# Patient Record
Sex: Female | Born: 1970
Health system: Southern US, Community
[De-identification: ages and names within clinical notes are randomized; demographics above are authoritative.]

## PROBLEM LIST (undated history)

## (undated) DIAGNOSIS — R112 Nausea with vomiting, unspecified: Secondary | ICD-10-CM

## (undated) DIAGNOSIS — E669 Obesity, unspecified: Secondary | ICD-10-CM

## (undated) DIAGNOSIS — Z9889 Other specified postprocedural states: Secondary | ICD-10-CM

## (undated) DIAGNOSIS — IMO0002 Reserved for concepts with insufficient information to code with codable children: Secondary | ICD-10-CM

## (undated) DIAGNOSIS — D649 Anemia, unspecified: Secondary | ICD-10-CM

## (undated) DIAGNOSIS — J45909 Unspecified asthma, uncomplicated: Secondary | ICD-10-CM

## (undated) DIAGNOSIS — I10 Essential (primary) hypertension: Secondary | ICD-10-CM

## (undated) HISTORY — PX: CHOLECYSTECTOMY: SHX55

## (undated) HISTORY — PX: GASTRIC BYPASS: SHX52

---

## 2000-07-20 ENCOUNTER — Other Ambulatory Visit: Admission: RE | Admit: 2000-07-20 | Discharge: 2000-07-20 | Payer: Self-pay | Admitting: Obstetrics and Gynecology

## 2000-12-28 ENCOUNTER — Encounter: Payer: Self-pay | Admitting: Obstetrics and Gynecology

## 2000-12-28 ENCOUNTER — Inpatient Hospital Stay (HOSPITAL_COMMUNITY): Admission: AD | Admit: 2000-12-28 | Discharge: 2000-12-28 | Payer: Self-pay | Admitting: Obstetrics and Gynecology

## 2000-12-29 ENCOUNTER — Inpatient Hospital Stay (HOSPITAL_COMMUNITY): Admission: AD | Admit: 2000-12-29 | Discharge: 2000-12-29 | Payer: Self-pay | Admitting: Obstetrics and Gynecology

## 2000-12-31 ENCOUNTER — Ambulatory Visit (HOSPITAL_COMMUNITY): Admission: RE | Admit: 2000-12-31 | Discharge: 2000-12-31 | Payer: Self-pay | Admitting: Obstetrics and Gynecology

## 2000-12-31 ENCOUNTER — Encounter: Payer: Self-pay | Admitting: Obstetrics and Gynecology

## 2001-01-03 ENCOUNTER — Ambulatory Visit (HOSPITAL_COMMUNITY): Admission: RE | Admit: 2001-01-03 | Discharge: 2001-01-03 | Payer: Self-pay | Admitting: Obstetrics and Gynecology

## 2001-01-03 ENCOUNTER — Encounter: Payer: Self-pay | Admitting: Obstetrics and Gynecology

## 2001-01-06 ENCOUNTER — Encounter (HOSPITAL_COMMUNITY): Admission: RE | Admit: 2001-01-06 | Discharge: 2001-01-20 | Payer: Self-pay | Admitting: Obstetrics and Gynecology

## 2001-01-06 ENCOUNTER — Encounter: Payer: Self-pay | Admitting: Obstetrics and Gynecology

## 2001-01-10 ENCOUNTER — Encounter: Payer: Self-pay | Admitting: Obstetrics and Gynecology

## 2001-01-13 ENCOUNTER — Encounter: Payer: Self-pay | Admitting: Obstetrics and Gynecology

## 2001-01-13 ENCOUNTER — Ambulatory Visit (HOSPITAL_COMMUNITY): Admission: RE | Admit: 2001-01-13 | Discharge: 2001-01-13 | Payer: Self-pay | Admitting: Obstetrics and Gynecology

## 2001-01-13 ENCOUNTER — Inpatient Hospital Stay (HOSPITAL_COMMUNITY): Admission: AD | Admit: 2001-01-13 | Discharge: 2001-01-13 | Payer: Self-pay | Admitting: Obstetrics and Gynecology

## 2001-01-17 ENCOUNTER — Encounter: Payer: Self-pay | Admitting: Obstetrics and Gynecology

## 2001-01-17 ENCOUNTER — Ambulatory Visit (HOSPITAL_COMMUNITY): Admission: RE | Admit: 2001-01-17 | Discharge: 2001-01-17 | Payer: Self-pay | Admitting: Obstetrics and Gynecology

## 2001-01-20 ENCOUNTER — Encounter: Payer: Self-pay | Admitting: Obstetrics and Gynecology

## 2001-01-23 ENCOUNTER — Inpatient Hospital Stay (HOSPITAL_COMMUNITY): Admission: AD | Admit: 2001-01-23 | Discharge: 2001-01-29 | Payer: Self-pay | Admitting: Obstetrics and Gynecology

## 2001-01-30 ENCOUNTER — Encounter: Admission: RE | Admit: 2001-01-30 | Discharge: 2001-03-01 | Payer: Self-pay | Admitting: Obstetrics and Gynecology

## 2001-03-01 ENCOUNTER — Other Ambulatory Visit: Admission: RE | Admit: 2001-03-01 | Discharge: 2001-03-01 | Payer: Self-pay | Admitting: Obstetrics and Gynecology

## 2002-05-24 ENCOUNTER — Other Ambulatory Visit: Admission: RE | Admit: 2002-05-24 | Discharge: 2002-05-24 | Payer: Self-pay | Admitting: Obstetrics and Gynecology

## 2003-08-06 ENCOUNTER — Other Ambulatory Visit: Admission: RE | Admit: 2003-08-06 | Discharge: 2003-08-06 | Payer: Self-pay | Admitting: Obstetrics and Gynecology

## 2004-07-16 ENCOUNTER — Emergency Department (HOSPITAL_COMMUNITY): Admission: EM | Admit: 2004-07-16 | Discharge: 2004-07-16 | Payer: Self-pay | Admitting: Emergency Medicine

## 2006-02-25 ENCOUNTER — Emergency Department (HOSPITAL_COMMUNITY): Admission: EM | Admit: 2006-02-25 | Discharge: 2006-02-25 | Payer: Self-pay | Admitting: Emergency Medicine

## 2009-08-14 ENCOUNTER — Emergency Department (HOSPITAL_COMMUNITY): Admission: EM | Admit: 2009-08-14 | Discharge: 2009-08-14 | Payer: Self-pay | Admitting: Emergency Medicine

## 2010-02-15 ENCOUNTER — Encounter: Payer: Self-pay | Admitting: Gastroenterology

## 2010-04-11 LAB — COMPREHENSIVE METABOLIC PANEL WITH GFR
AST: 146 U/L — ABNORMAL HIGH (ref 0–37)
Albumin: 3.5 g/dL (ref 3.5–5.2)
Alkaline Phosphatase: 78 U/L (ref 39–117)
BUN: 9 mg/dL (ref 6–23)
CO2: 27 meq/L (ref 19–32)
Chloride: 106 meq/L (ref 96–112)
Creatinine, Ser: 0.46 mg/dL (ref 0.4–1.2)
GFR calc Af Amer: >60 mL/min (ref >60)
GFR calc non Af Amer: >60 mL/min (ref >60)
Potassium: 3.8 meq/L (ref 3.5–5.1)
Total Bilirubin: 0.9 mg/dL (ref 0.3–1.2)

## 2010-04-11 LAB — DIFFERENTIAL
Basophils Absolute: 0 K/uL (ref 0.0–0.1)
Basophils Relative: 0 % (ref 0–1)
Eosinophils Absolute: 0.1 10*3/uL (ref 0.0–0.7)
Eosinophils Relative: 1 % (ref 0–5)
Lymphocytes Relative: 22 % (ref 12–46)
Lymphs Abs: 1.5 10*3/uL (ref 0.7–4.0)
Monocytes Absolute: 0.5 K/uL (ref 0.1–1.0)
Monocytes Relative: 7 % (ref 3–12)
Neutro Abs: 4.5 10*3/uL (ref 1.7–7.7)
Neutrophils Relative %: 69 % (ref 43–77)

## 2010-04-11 LAB — CBC
HCT: 31.1 % — ABNORMAL LOW (ref 36.0–46.0)
Hemoglobin: 9.9 g/dL — ABNORMAL LOW (ref 12.0–15.0)
MCH: 23.8 pg — ABNORMAL LOW (ref 26.0–34.0)
MCHC: 31.7 g/dL (ref 30.0–36.0)
MCV: 75.1 fL — ABNORMAL LOW (ref 78.0–100.0)
Platelets: 284 10*3/uL (ref 150–400)
RBC: 4.14 MIL/uL (ref 3.87–5.11)
RDW: 17.3 % — ABNORMAL HIGH (ref 11.5–15.5)
WBC: 6.5 K/uL (ref 4.0–10.5)

## 2010-04-11 LAB — COMPREHENSIVE METABOLIC PANEL
ALT: 46 U/L — ABNORMAL HIGH (ref 0–35)
Calcium: 8.7 mg/dL (ref 8.4–10.5)
Glucose, Bld: 93 mg/dL (ref 70–99)
Sodium: 139 mEq/L (ref 135–145)
Total Protein: 6.3 g/dL (ref 6.0–8.3)

## 2010-04-11 LAB — HEMOCCULT GUIAC POC 1CARD (OFFICE): Fecal Occult Bld: NEGATIVE

## 2010-04-11 LAB — LIPASE, BLOOD: Lipase: 31 U/L (ref 11–59)

## 2010-06-12 NOTE — Op Note (Signed)
[facility]  Patient:    [name], [name] Visit Number: [id number] MRN: [id number]          Service Type: ANT Location: [facility] Attending Physician:  Frederich Balding Dictated by:   Marcelle Overlie, M.D. Proc. Date: 01/26/01 Admit Date:  01/06/2001 Discharge Date: 01/20/2001                             Operative Report  PREOPERATIVE DIAGNOSES:       1. Intrauterine pregnancy at 36-4/7 weeks.                               2. Chronic hypertension.                               3. Intrauterine growth _______.                               4. Failure to progress.  POSTOPERATIVE DIAGNOSES:      1. Intrauterine pregnancy at 36-4/7 weeks.                               2. Chronic hypertension.                               3. Intrauterine growth _______.                               4. Failure to progress.  OPERATION:                    Primary low transverse cesarean section.  SURGEON:                      Marcelle Overlie, M.D.  ASSISTANT:                    Juluis Mire, M.D.  ANESTHESIA:                   Epidural.  ESTIMATED BLOOD LOSS:         500 cc.  DESCRIPTION OF PROCEDURE:     The patient was taken to the operating room. She had her epidural dosed.  It was found to be adequate.  She was then prepped and draped in the usual sterile fashion.  A Foley catheter was already placed in the bladder.  Using a scalpel, a low transverse incision was made, and carried down to the fascia.  Using electrocautery with good hemostasis, the fascia was scored in the midline and extended laterally.  A Pfannenstiel incision was then created, and the peritoneum was then entered.  The peritoneal incision was then stretched.  The bladder blade was inserted.  The lower uterine segment was identified, and the bladder flap was then created sharply initially, and the bladder blade was then readjusted.  A low transverse incision was made in the uterus and  extended laterally.  Amniotic fluid was noted to be clear.  The baby was in the cephalic presentation and delivered quite easily.  It was a female infant with Apgars  8 at one minute and 9 at five minutes.  Cord pH was obtained and found to be 7.22.  Cord was clamped and cut.  The baby was handed to the awaiting pediatricians.  Cord blood was obtained and the placenta was manually removed and noted to be intact.  The uterus was cleared of all clots and debris.  The uterine incision was then closed using 0 chromic and continuous running locked stitch and found to be hemostatic.  There were two figure-of-eights placed on the left side at the left corner of the incision for additional hemostasis.  The peritoneum was closed in a single layer using a Vicryl continuous running stitch, and the rectus muscles were reapproximated using the same 0 Vicryl.  The fascia was closed using #1 PDS suture using a continuous running stitch, starting at each corner and meeting in the midline.  After inspection of the subcutaneous layers, a Hemovac drain was placed in the subcutaneous layer, and then a series of interrupted using plain gut suture x 4 was placed in the subcutaneous layer.  The skin was closed with staples. All sponge, lap, and instrument counts were correct x 2.  The patient tolerated the procedure well and went to the recovery room in stable condition. Dictated by:   Marcelle Overlie, M.D. Attending Physician:  Frederich Balding DD:  01/26/01 TD:  01/27/01 Job: [id number]

## 2010-06-12 NOTE — Discharge Summary (Signed)
[facility]  Patient:    [name], [name] Visit Number: [id number] MRN: [id number]          Service Type: OBS Location: 910A 9104 01 Attending Physician:  Cordelia Pen Ii Dictated by:   Danie Chandler, R.N. Admit Date:  01/23/2001 Discharge Date: 01/29/2001                             Discharge Summary  ADMISSION DIAGNOSIS:  Intrauterine pregnancy at 36-4/[redacted] weeks gestation with intrauterine growth retardation and chronic hypertension with elevated blood pressures.  DISCHARGE DIAGNOSES: 1. Intrauterine pregnancy at 36-4/[redacted] weeks gestation with intrauterine growth    retardation and chronic hypertension with elevated blood pressures. 2. Failure to progress.  PROCEDURE:  On January 26, 2001, primary low transverse cesarean section.  REASON FOR ADMISSION:  The patient is a 40 year old married white female, gravida 1, para 0, with an estimated date of confinement of February 16, 2001, placing her at approximately 36-1/[redacted] weeks gestation.  Her pregnancy has been complicated by morbid obesity, chronic hypertension, for which she has been on Aldomet, IUGR.  The patient was monitored with beefs two times a week.  The patient also had non-stress tests which were reactive, but the patient has had an increase in blood pressures, 150s/90s to 100s, thus she was admitted for induction of labor.  The patient had Cytotec placed.  She had Pitocin augmentation and artificial rupture of membranes carried out, and she progressed to a good labor pattern all day long on January 26, 2001.  Despite adequate MVU, she did not change her cervix, and the cervix remained at 2 cm dilated.  Thus, she was taken to the operating room for a cesarean section.  HOSPITAL COURSE:  The patient underwent the above named procedure without complication.  This was productive of a viable female infant with Apgars of 8 at one minute and 9 at five minutes.  Postoperatively, on day  #1, the patient was without complaints.  She had a good return of bowel function.  Her blood pressure was stable on Aldomet.  Her hemoglobin was 11.4, hematocrit 34.8, and white blood cell count 10.8.  On postoperative day #2, the patient remained without complaints.  Blood pressure was 146/77, and she remained on Aldomet. She was tolerating a regular diet and ambulating well without difficulty, and also had good pain control.  She was discharged home on postoperative day #3.  CONDITION ON DISCHARGE:  Good.  DIET:  Regular as tolerated.  ACTIVITY:  No heavy lifting, no driving, no vaginal entry.  FOLLOWUP:  She was to follow up in the office on February 02, 2001.  SHe is to call for temperature greater than 100 degrees, persistent nausea, vomiting, heavy vaginal bleeding, and/or redness or drainage from the incision site.  DISCHARGE MEDICATIONS: 1. Prenatal vitamin one p.o. q.d. 2. Aldomet 500 mg one p.o. b.Townsend.d. 3. Tylox one or two p.o. q.4h. p.r.n. pain. 4. Ibuprofen p.r.n. Dictated by:   Danie Chandler, R.N. Attending Physician:  Soledad Gerlach DD:  02/03/01 TD:  02/04/01 Job: [id number]

## 2011-03-10 ENCOUNTER — Observation Stay (HOSPITAL_COMMUNITY)
Admission: EM | Admit: 2011-03-10 | Discharge: 2011-03-10 | Disposition: A | Discharge status: Home / Self Care | Payer: Self-pay | Attending: Emergency Medicine | Admitting: Emergency Medicine

## 2011-03-10 ENCOUNTER — Observation Stay (HOSPITAL_COMMUNITY): Payer: Self-pay

## 2011-03-10 ENCOUNTER — Encounter (HOSPITAL_COMMUNITY): Payer: Self-pay | Admitting: *Deleted

## 2011-03-10 DIAGNOSIS — I10 Essential (primary) hypertension: Secondary | ICD-10-CM | POA: Insufficient documentation

## 2011-03-10 DIAGNOSIS — M51379 Other intervertebral disc degeneration, lumbosacral region without mention of lumbar back pain or lower extremity pain: Principal | ICD-10-CM | POA: Insufficient documentation

## 2011-03-10 DIAGNOSIS — M545 Low back pain, unspecified: Secondary | ICD-10-CM

## 2011-03-10 DIAGNOSIS — E669 Obesity, unspecified: Secondary | ICD-10-CM | POA: Insufficient documentation

## 2011-03-10 DIAGNOSIS — M5137 Other intervertebral disc degeneration, lumbosacral region: Secondary | ICD-10-CM | POA: Insufficient documentation

## 2011-03-10 DIAGNOSIS — IMO0002 Reserved for concepts with insufficient information to code with codable children: Secondary | ICD-10-CM

## 2011-03-10 DIAGNOSIS — M5126 Other intervertebral disc displacement, lumbar region: Secondary | ICD-10-CM | POA: Insufficient documentation

## 2011-03-10 HISTORY — DX: Essential (primary) hypertension: I10

## 2011-03-10 MED ORDER — ONDANSETRON HCL 4 MG/2ML IJ SOLN
4.0000 mg | Freq: Four times a day (QID) | INTRAMUSCULAR | Status: DC | PRN
  Administered 2011-03-10: 4 mg via INTRAVENOUS
  Filled 2011-03-10: qty 2

## 2011-03-10 MED ORDER — HYDROMORPHONE HCL PF 1 MG/ML IJ SOLN
1.0000 mg | Freq: Four times a day (QID) | INTRAMUSCULAR | Status: DC | PRN
  Filled 2011-03-10: qty 1

## 2011-03-10 MED ORDER — OXYCODONE-ACETAMINOPHEN 5-325 MG PO TABS
1.0000 | ORAL_TABLET | Freq: Four times a day (QID) | ORAL | Status: DC | PRN
Start: 2011-03-10 — End: 2011-03-10
  Administered 2011-03-10: 2 via ORAL
  Filled 2011-03-10: qty 2

## 2011-03-10 MED ORDER — OXYCODONE-ACETAMINOPHEN 5-325 MG PO TABS: 1.0000 | ORAL_TABLET | ORAL | Status: AC | PRN

## 2011-03-10 MED ORDER — DIAZEPAM 5 MG PO TABS: 5.0000 mg | ORAL_TABLET | Freq: Three times a day (TID) | ORAL | Status: AC | PRN

## 2011-03-10 MED ORDER — KETOROLAC TROMETHAMINE 30 MG/ML IJ SOLN
30.0000 mg | Freq: Once | INTRAMUSCULAR | Status: AC
  Administered 2011-03-10: 30 mg via INTRAVENOUS
  Filled 2011-03-10: qty 1

## 2011-03-10 MED ORDER — HYDROMORPHONE HCL PF 1 MG/ML IJ SOLN
1.0000 mg | Freq: Once | INTRAMUSCULAR | Status: AC
  Administered 2011-03-10: 1 mg via INTRAVENOUS
  Filled 2011-03-10: qty 1

## 2011-03-10 MED ORDER — HYDROMORPHONE HCL PF 1 MG/ML IJ SOLN
1.0000 mg | Freq: Once | INTRAMUSCULAR | Status: AC
  Administered 2011-03-10: 1 mg via INTRAVENOUS

## 2011-03-10 MED ORDER — CYCLOBENZAPRINE HCL 10 MG PO TABS
5.0000 mg | ORAL_TABLET | Freq: Three times a day (TID) | ORAL | Status: DC | PRN
  Administered 2011-03-10: 5 mg via ORAL
  Filled 2011-03-10: qty 1

## 2011-03-10 NOTE — ED Notes (Signed)
Pt ambulated to bathroom without difficulty. Pt sts that she did feel some pain but not as much as earlier.

## 2011-03-10 NOTE — ED Provider Notes (Signed)
Medical screening examination/treatment/procedure(s) were conducted as a shared visit with non-physician practitioner(s) and myself.  I personally evaluated the patient during the encounter   Amy Sprout, MD 03/10/11 1551

## 2011-03-10 NOTE — Discharge Instructions (Signed)
Please call the orthopedist or neurosurgeon listed above for further treatment of your back pain.  You may also call an orthopedist or neurosurgeon of your choice.  Return to the ER immediately if you develop weakness of your leg, loss of control of bowel or bladder, or you are unable to bear weight on your leg.  You may return to the ER at any time for worsening condition or any new symptoms that concern you.   Back Pain, Adult Low back pain is very common. About 1 in 5 people have back pain.The cause of low back pain is rarely dangerous. The pain often gets better over time.About half of people with a sudden onset of back pain feel better in just 2 weeks. About 8 in 10 people feel better by 6 weeks.  CAUSES Some common causes of back pain include:  Strain of the muscles or ligaments supporting the spine.   Wear and tear (degeneration) of the spinal discs.   Arthritis.   Direct injury to the back.  DIAGNOSIS Most of the time, the direct cause of low back pain is not known.However, back pain can be treated effectively even when the exact cause of the pain is unknown.Answering your caregiver's questions about your overall health and symptoms is one of the most accurate ways to make sure the cause of your pain is not dangerous. If your caregiver needs more information, he or she may order lab work or imaging tests (X-rays or MRIs).However, even if imaging tests show changes in your back, this usually does not require surgery. HOME CARE INSTRUCTIONS For many people, back pain returns.Since low back pain is rarely dangerous, it is often a condition that people can learn to [facility] their own.   Remain active. It is stressful on the back to sit or stand in one place. Do not sit, drive, or stand in one place for more than 30 minutes at a time. Take short walks on level surfaces as soon as pain allows.Try to increase the length of time you walk each day.   Do not stay in bed.Resting more than  1 or 2 days can delay your recovery.   Do not avoid exercise or work.Your body is made to move.It is not dangerous to be active, even though your back may hurt.Your back will likely heal faster if you return to being active before your pain is gone.   Pay attention to your body when you bend and lift. Many people have less discomfortwhen lifting if they bend their knees, keep the load close to their bodies,and avoid twisting. Often, the most comfortable positions are those that put less stress on your recovering back.   Find a comfortable position to sleep. Use a firm mattress and lie on your side with your knees slightly bent. If you lie on your back, put a pillow under your knees.   Only take over-the-counter or prescription medicines as directed by your caregiver. Over-the-counter medicines to reduce pain and inflammation are often the most helpful.Your caregiver may prescribe muscle relaxant drugs.These medicines help dull your pain so you can more quickly return to your normal activities and healthy exercise.   Put ice on the injured area.   Put ice in a plastic bag.   Place a towel between your skin and the bag.   Leave the ice on for 15 to 20 minutes, 3 to 4 times a day for the first 2 to 3 days. After that, ice and heat may be alternated  to reduce pain and spasms.   Ask your caregiver about trying back exercises and gentle massage. This may be of some benefit.   Avoid feeling anxious or stressed.Stress increases muscle tension and can worsen back pain.It is important to recognize when you are anxious or stressed and learn ways to manage it.Exercise is a great option.  SEEK MEDICAL CARE IF:  You have pain that is not relieved with rest or medicine.   You have pain that does not improve in 1 week.   You have new symptoms.   You are generally not feeling well.  SEEK IMMEDIATE MEDICAL CARE IF:   You have pain that radiates from your back into your legs.   You develop  new bowel or bladder control problems.   You have unusual weakness or numbness in your arms or legs.   You develop nausea or vomiting.   You develop abdominal pain.   You feel faint.  Document Released: 01/11/2005 Document Revised: 09/23/2010 Document Reviewed: 06/01/2010 [facility] Patient Information 2012 [place], [place].

## 2011-03-10 NOTE — ED Provider Notes (Signed)
History     CSN: [id number]  Arrival date & time 03/10/11  7829   First MD Initiated Contact with Patient 03/10/11 [phone]      Chief Complaint  Patient presents with  . Back Pain    c/o right low back pain radiating into RLE    (Consider location/radiation/quality/duration/timing/severity/associated sxs/prior treatment) Patient is a 41 y.o. female presenting with back pain. The history is provided by the patient.  Back Pain  This is a recurrent problem. Episode onset: One month ago. The problem occurs daily. The problem has been rapidly worsening. The pain is associated with no known injury. The pain is present in the lumbar spine. The quality of the pain is described as shooting and stabbing. The pain does not radiate. The pain is at a severity of 10/10. The pain is severe. The symptoms are aggravated by bending, twisting and certain positions (Unable to put pressure on the right leg due to severe pain in the right side of her back). The pain is the same all the time. Pertinent negatives include no chest pain, no fever, no numbness, no weight loss, no abdominal pain, no abdominal swelling, no bowel incontinence, no bladder incontinence, no dysuria, no paresthesias, no paresis and no weakness. She has tried NSAIDs, muscle relaxants and analgesics (Received steroid shots and tried tramadol and Vicodin) for the symptoms. The treatment provided no relief. Risk factors include obesity.    Past Medical History  Diagnosis Date  . Hypertension     Past Surgical History  Procedure Date  . Cholecystectomy   . Gastric bypass     No family history on file.  History  Substance Use Topics  . Smoking status: Never Smoker   . Smokeless tobacco: Not on file  . Alcohol Use: No    OB History    Grav Para Term Preterm Abortions TAB SAB Ect Mult Living                  Review of Systems  Constitutional: Negative for fever and weight loss.  Cardiovascular: Negative for chest pain.    Gastrointestinal: Negative for abdominal pain and bowel incontinence.  Genitourinary: Negative for bladder incontinence and dysuria.  Musculoskeletal: Positive for back pain.  Neurological: Negative for weakness, numbness and paresthesias.  All other systems reviewed and are negative.    Allergies  Review of patient's allergies indicates no known allergies.  Home Medications  No current outpatient prescriptions on file.  BP 142/65  Pulse 90  Temp(Src) 98.3 F (36.8 C) (Oral)  Resp 20  SpO2 100%  LMP 03/09/2011  Physical Exam  Nursing note and vitals reviewed. Constitutional: She is oriented to person, place, and time. She appears well-developed and well-nourished. She appears distressed.  HENT:  Head: Normocephalic and atraumatic.  Mouth/Throat: Oropharynx is clear and moist.  Eyes: EOM are normal. Pupils are equal, round, and reactive to light.  Cardiovascular: Normal rate, regular rhythm, normal heart sounds and intact distal pulses.  Exam reveals no friction rub.   No murmur heard. Pulmonary/Chest: Effort normal and breath sounds normal. She has no wheezes. She has no rales.  Abdominal: Soft. Bowel sounds are normal. She exhibits no distension. There is no tenderness. There is no rebound and no guarding.  Musculoskeletal: She exhibits no tenderness.       Lumbar back: She exhibits decreased range of motion, tenderness, bony tenderness, pain and spasm. She exhibits no deformity and normal pulse.       Large amount of  excess skin. No edema  Neurological: She is alert and oriented to person, place, and time. No cranial nerve deficit.  Skin: Skin is warm and dry. No rash noted.  Psychiatric: She has a normal mood and affect. Her behavior is normal.    ED Course  Procedures (including critical care time)  Labs Reviewed - No data to display No results found.   No diagnosis found.    MDM   Pt with gradual onset of back pain intermittently for the last month became  severe on Saturday and saw her doctor yesterday and received IM Injections of pain medication and steroids.  At this morning she was showering the pain started to return and became severe to the point where she was unable to ambulate due to severe pain with pressure on the right leg. She has a history of gastric bypass and has lost a large amount of weight but denies any prior back problems, cancer, trauma. 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 No neurovascular compromise and no incontinence.  Pt has no infectious sx, hx of CA  or other red flags concerning for pathologic back pain.  Normal strength and reflexes on exam.  Denies trauma.  She was so uncomfortable she is unable to ambulate. She has an odor to get an MRI done at [facility] imaging however we will just do the MRI here and place her on the back pain protocol for pain control.         Gwyneth Sprout, MD 03/10/11 [phone]

## 2011-03-10 NOTE — Progress Notes (Signed)
Observation review is complete. 

## 2011-03-10 NOTE — ED Provider Notes (Signed)
1:18 PM patient is in CDU holding for MRI of her L-spine and pain control.  Patient reports pain has improved somewhat with pain medications, but she continues to have pain in her right lower back when she moves her right leg.  MRI result shows degenerative disc disease at L1 to and an annular rent and a very shallow right forming a disc protrusion at L4-5, but no neural compression.  Discussed results with patient and family.    3:17 PM Patient ambulates without assistance.  Discussed results with Dr Anitra Lauth.  Plan is for d/c home with increased pain medication.  Discussed this with patient.  Patient verbalizes understanding and agrees with plan. [name], [name] 03/10/11 [phone]

## 2011-03-10 NOTE — ED Notes (Signed)
Patient arrived via EMS, ambulatory at home with intermittent right low back pain x"weeks" worsened 5 days ago. No known injury.  Denies dysuria

## 2011-03-10 NOTE — ED Notes (Signed)
Trixie Dredge, PA made aware that pt ambulated to the bathroom without assistance.

## 2012-07-05 ENCOUNTER — Emergency Department (HOSPITAL_COMMUNITY): Payer: BC Managed Care – PPO

## 2012-07-05 ENCOUNTER — Emergency Department (HOSPITAL_COMMUNITY)
Admission: EM | Admit: 2012-07-05 | Discharge: 2012-07-05 | Disposition: A | Discharge status: Home / Self Care | Payer: BC Managed Care – PPO | Attending: Emergency Medicine | Admitting: Emergency Medicine

## 2012-07-05 ENCOUNTER — Encounter (HOSPITAL_COMMUNITY): Payer: Self-pay | Admitting: *Deleted

## 2012-07-05 DIAGNOSIS — Z79899 Other long term (current) drug therapy: Secondary | ICD-10-CM | POA: Insufficient documentation

## 2012-07-05 DIAGNOSIS — I10 Essential (primary) hypertension: Secondary | ICD-10-CM | POA: Insufficient documentation

## 2012-07-05 DIAGNOSIS — Z3202 Encounter for pregnancy test, result negative: Secondary | ICD-10-CM | POA: Insufficient documentation

## 2012-07-05 DIAGNOSIS — K2970 Gastritis, unspecified, without bleeding: Secondary | ICD-10-CM

## 2012-07-05 DIAGNOSIS — N898 Other specified noninflammatory disorders of vagina: Secondary | ICD-10-CM | POA: Insufficient documentation

## 2012-07-05 LAB — COMPREHENSIVE METABOLIC PANEL
ALT: 10 U/L (ref 0–35)
AST: 17 U/L (ref 0–37)
CO2: 24 mEq/L (ref 19–32)
Chloride: 107 mEq/L (ref 96–112)
GFR calc Af Amer: >90 mL/min (ref >90)
GFR calc non Af Amer: >90 mL/min (ref >90)
Glucose, Bld: 88 mg/dL (ref 70–99)
Sodium: 139 mEq/L (ref 135–145)
Total Bilirubin: 0.3 mg/dL (ref 0.3–1.2)

## 2012-07-05 LAB — CBC WITH DIFFERENTIAL/PLATELET
Basophils Relative: 0 % (ref 0–1)
HCT: 25 % — ABNORMAL LOW (ref 36.0–46.0)
Hemoglobin: 6.6 g/dL — CL (ref 12.0–15.0)
Lymphocytes Relative: 31 % (ref 12–46)
Monocytes Relative: 7 % (ref 3–12)
Neutro Abs: 3.6 10*3/uL (ref 1.7–7.7)
WBC: 6.1 10*3/uL (ref 4.0–10.5)

## 2012-07-05 LAB — URINALYSIS, ROUTINE W REFLEX MICROSCOPIC
Bilirubin Urine: NEGATIVE
Glucose, UA: NEGATIVE mg/dL
Hgb urine dipstick: NEGATIVE
Ketones, ur: NEGATIVE mg/dL
Protein, ur: NEGATIVE mg/dL
Urobilinogen, UA: 1 mg/dL (ref 0.0–1.0)

## 2012-07-05 LAB — POCT PREGNANCY, URINE: Preg Test, Ur: NEGATIVE

## 2012-07-05 MED ORDER — GI COCKTAIL ~~LOC~~: 30.0000 mL | Freq: Once | ORAL | Status: DC

## 2012-07-05 MED ORDER — SUCRALFATE 1 G PO TABS: 1.0000 g | ORAL_TABLET | Freq: Four times a day (QID) | ORAL | Status: DC

## 2012-07-05 MED ORDER — SODIUM CHLORIDE 0.9 % IV BOLUS (SEPSIS)
1000.0000 mL | Freq: Once | INTRAVENOUS | Status: AC
  Administered 2012-07-05: 1000 mL via INTRAVENOUS

## 2012-07-05 MED ORDER — FAMOTIDINE 20 MG PO TABS
40.0000 mg | ORAL_TABLET | Freq: Once | ORAL | Status: AC
  Administered 2012-07-05: 40 mg via ORAL
  Filled 2012-07-05: qty 2

## 2012-07-05 MED ORDER — IOHEXOL 300 MG/ML  SOLN
100.0000 mL | Freq: Once | INTRAMUSCULAR | Status: AC | PRN
  Administered 2012-07-05: 100 mL via INTRAVENOUS

## 2012-07-05 MED ORDER — FAMOTIDINE 20 MG PO TABS: 20.0000 mg | ORAL_TABLET | Freq: Two times a day (BID) | ORAL | Status: DC

## 2012-07-05 MED ORDER — IOHEXOL 300 MG/ML  SOLN
25.0000 mL | INTRAMUSCULAR | Status: AC
  Administered 2012-07-05: 25 mL via ORAL

## 2012-07-05 NOTE — ED Provider Notes (Signed)
History     CSN: [id number]  Arrival date & time 07/05/12  1245   First MD Initiated Contact with Patient 07/05/12 1507      Chief Complaint  Patient presents with  . Abdominal Pain    (Consider location/radiation/quality/duration/timing/severity/associated sxs/prior treatment) HPI Comments: 42 year old morbidly obese female the past medical history of hypertension, iron deficiency anemia, cholecystectomy in 2011 and Roux-en-Y gastric bypass surgery in 2004 presents to the emergency department complaining of non-radiating mid epigastric abdominal pain beginning today around 8:30 this morning. Patient states she was sitting at her desk at work when the pain began. Pain described as cramping with occasional sharp spurts, cramping rated 2/10, sharpness 8/10. Pain worse with deep inspiration, denies chest pain or shortness of breath. She try taking the medication at home that she has for menstrual cramping without relief. She is currently towards the end of her menstrual cycle, however her gynecologist has placed on birth control in order to prevent her from getting her cycle due to history of IDA and heavy bleeding. Cycle began on Thursday so she called him and he advised her to double up on her birth control pills and her cycle is finally beginning to subside. Denies lightheadedness, dizziness or weakness. No associated nausea, vomiting, bowel changes urinary changes. Denies having any complications with her prior bypass surgery. No fever or chills. Normal appetite. Earlier today she had scrambled eggs, and last night she had shrimp for dinner.  Patient is a 42 y.o. female presenting with abdominal pain. The history is provided by the patient.  Abdominal Pain Associated symptoms include abdominal pain. Pertinent negatives include no chest pain, chills, fatigue, fever, nausea, vomiting or weakness.    Past Medical History  Diagnosis Date  . Hypertension     Past Surgical History  Procedure  Laterality Date  . Cholecystectomy    . Gastric bypass      No family history on file.  History  Substance Use Topics  . Smoking status: Never Smoker   . Smokeless tobacco: Not on file  . Alcohol Use: No    OB History   Grav Para Term Preterm Abortions TAB SAB Ect Mult Living                  Review of Systems  Constitutional: Negative for fever, chills, appetite change and fatigue.  Respiratory: Negative for shortness of breath.   Cardiovascular: Negative for chest pain.  Gastrointestinal: Positive for abdominal pain. Negative for nausea, vomiting, diarrhea and constipation.  Genitourinary: Positive for vaginal bleeding. Negative for dysuria, urgency, frequency, vaginal discharge, difficulty urinating and vaginal pain.  Musculoskeletal: Negative for back pain.  Skin: Negative for pallor.  Neurological: Negative for dizziness, weakness and light-headedness.  All other systems reviewed and are negative.    Allergies  Review of patient's allergies indicates no known allergies.  Home Medications   Current Outpatient Rx  Name  Route  Sig  Dispense  Refill  . albuterol (PROVENTIL HFA;VENTOLIN HFA) 108 (90 BASE) MCG/ACT inhaler   Inhalation   Inhale 1-2 puffs into the lungs every 6 (six) hours as needed. For wheezing         . atenolol (TENORMIN) 25 MG tablet   Oral   Take 12.5 mg by mouth daily.         . ferrous sulfate 325 (65 FE) MG tablet   Oral   Take 325 mg by mouth daily with breakfast.         . Norethin  Ace-Eth Estrad-FE (MINASTRIN 24 FE PO)   Oral   Take 1 tablet by mouth daily.           BP 150/70  Pulse 73  Temp(Src) 98.2 F (36.8 C) (Oral)  Resp 20  SpO2 100%  Physical Exam  Nursing note and vitals reviewed. Constitutional: She is oriented to person, place, and time. She appears well-developed.  Morbidly obese  HENT:  Head: Normocephalic and atraumatic.  Mouth/Throat: Oropharynx is clear and moist. Mucous membranes are not pale  and not dry.  Eyes: Conjunctivae and EOM are normal. Pupils are equal, round, and reactive to light.  Neck: Normal range of motion. Neck supple.  Cardiovascular: Normal rate, regular rhythm, normal heart sounds and intact distal pulses.   Pulmonary/Chest: Effort normal and breath sounds normal. No respiratory distress.  Abdominal: Soft. Bowel sounds are normal. There is tenderness in the right upper quadrant and epigastric area. There is no rigidity, no rebound and no guarding.  Abdominal exam limited by patient's body habitus.  Musculoskeletal: Normal range of motion. She exhibits no edema.  Neurological: She is alert and oriented to person, place, and time.  Skin: Skin is warm and dry. No pallor.  Psychiatric: She has a normal mood and affect. Her behavior is normal.    ED Course  Procedures (including critical care time)  Labs Reviewed  CBC WITH DIFFERENTIAL - Abnormal; Notable for the following:    RBC 3.85 (*)    Hemoglobin 6.6 (*)    HCT 25.0 (*)    MCV 64.9 (*)    MCH 17.1 (*)    MCHC 26.4 (*)    RDW 22.5 (*)    Platelets 440 (*)    All other components within normal limits  COMPREHENSIVE METABOLIC PANEL - Abnormal; Notable for the following:    Potassium 3.4 (*)    Albumin 3.1 (*)    All other components within normal limits  LIPASE, BLOOD  URINALYSIS, ROUTINE W REFLEX MICROSCOPIC  POCT PREGNANCY, URINE  POCT I-STAT TROPONIN I   Ct Abdomen Pelvis W Contrast  07/05/2012   *RADIOLOGY REPORT*  Clinical Data: Mid abdominal pain and history of gastric bypass surgery and laparoscopic cholecystectomy.  CT ABDOMEN AND PELVIS WITH CONTRAST  Technique:  Multidetector CT imaging of the abdomen and pelvis was performed following the standard protocol during bolus administration of intravenous contrast.  Contrast: OMNIPAQUE IOHEXOL 300 MG/ML  SOLN  Comparison: 08/14/2009  Findings: There is evidence of Roux-en-Y gastric bypass with no evidence of complication by CT or  obstruction.  Oral contrast does exit the gastric remnant and enters normal appearing small bowel loops.  There is no evidence of bowel perforation or abscess.  The liver, spleen, pancreas, adrenal glands and kidneys are unremarkable.  The gallbladder has been removed.  No biliary ductal dilatation is identified.  No free fluid or abscess is identified.  No focal masses or enlarged lymph nodes are seen.  There is a stable ventral hernia just superior to the umbilicus containing fat.  The bladder, uterus and adnexal regions are unremarkable by CT.  Mild degenerative changes are present in the spine.  IMPRESSION: No acute findings.  No complications are evident status post prior gastric bypass.  There is a stable supraumbilical ventral hernia containing fat.   Original Report Authenticated By: Irish Lack, M.D.     1. Gastritis       MDM  42 y/o female with mid-epigastric abdominal pain. No other symptoms present. HGB 6.6.  Asymptomatic anemia. Will give fluids at this time. Hx of Roux-en-Y gastric bypass surgery. Abdominal exam limited by large body habitus. Will obtain CT abdomen/pelvis to rule out any intra-abdominal abnormality. Labs otherwise unremarkable. Vitals stable, afebrile and in NAD. GI cocktail and Pepcid. Case discussed with Dr. Jeraldine Loots who also evaluated patient and agrees with plan of care. 6:04 PM CT scan negative for any intra-abdominal abnormality. Patient reports marked improvement in pain with Pepcid. She did not receive GI cocktail prior to CT, however states she does not need it. Will discharge with pepcid and carafate for gastritis, f/u with PCP. Return precautions discussed. Patient states understanding of plan and is agreeable.  Trevor Mace, PA-C 07/05/12 1805

## 2012-07-05 NOTE — ED Notes (Signed)
Pt states that at 0830 she started having severe mid upper abdominal pain and states no chest pain.  Pt states with a deep breath the pain in her abdomen increases

## 2012-07-06 NOTE — ED Provider Notes (Signed)
  Medical screening examination/treatment/procedure(s) were performed by non-physician practitioner and as supervising physician I was immediately available for consultation/collaboration.  On my exam the patient was in no distress. With her history of gastric bypass or some concern for intra-abdominal pathology.  Patient's CT was unremarkable him and she improved with analgesics here.    Gerhard Munch, MD 07/06/12 0004

## 2012-09-02 ENCOUNTER — Ambulatory Visit (INDEPENDENT_AMBULATORY_CARE_PROVIDER_SITE_OTHER): Payer: BC Managed Care – PPO | Admitting: Emergency Medicine

## 2012-09-02 ENCOUNTER — Ambulatory Visit (HOSPITAL_COMMUNITY)
Admission: RE | Admit: 2012-09-02 | Discharge: 2012-09-02 | Disposition: A | Discharge status: Home / Self Care | Payer: BC Managed Care – PPO | Source: Ambulatory Visit | Attending: Emergency Medicine | Admitting: Emergency Medicine

## 2012-09-02 VITALS — BP 140/82 | HR 73 | Temp 98.0°F | Resp 16 | Ht 64.0 in | Wt 328.0 lb

## 2012-09-02 DIAGNOSIS — I809 Phlebitis and thrombophlebitis of unspecified site: Secondary | ICD-10-CM

## 2012-09-02 DIAGNOSIS — I8000 Phlebitis and thrombophlebitis of superficial vessels of unspecified lower extremity: Secondary | ICD-10-CM | POA: Insufficient documentation

## 2012-09-02 DIAGNOSIS — M79609 Pain in unspecified limb: Secondary | ICD-10-CM | POA: Insufficient documentation

## 2012-09-02 NOTE — Progress Notes (Signed)
[facility] [address], [place] [address] [phone]  Date:  09/02/2012   Name:  [name]   DOB:  1971/01/23   MRN:  [id number]  PCP:  No PCP Per Patient    Chief Complaint: Leg Pain   History of Present Illness:  [name] is a 42 y.o. very pleasant female patient who presents with the following:  Pain in right leg just distal to the knee laterally.  Now has tender swelling that is warm to the touch.  No fever or chills. No history or injury.  On a new OCP to stop her menses by the GYN this summer.  Non smoker.  Hypertension and anemia.  No improvement with over the counter medications or other home remedies.  Denies other complaint or health concern today.   There are no active problems to display for this patient.   Past Medical History  Diagnosis Date  . Hypertension     Past Surgical History  Procedure Laterality Date  . Cholecystectomy    . Gastric bypass      History  Substance Use Topics  . Smoking status: Never Smoker   . Smokeless tobacco: Not on file  . Alcohol Use: No    Family History  Problem Relation Age of Onset  . Hypertension Sister     No Known Allergies  Medication list has been reviewed and updated.  Current Outpatient Prescriptions on File Prior to Visit  Medication Sig Dispense Refill  . albuterol (PROVENTIL HFA;VENTOLIN HFA) 108 (90 BASE) MCG/ACT inhaler Inhale 1-2 puffs into the lungs every 6 (six) hours as needed. For wheezing      . atenolol (TENORMIN) 25 MG tablet Take 12.5 mg by mouth daily.      . famotidine (PEPCID) 20 MG tablet Take 1 tablet (20 mg total) by mouth 2 (two) times daily.  30 tablet  0  . ferrous sulfate 325 (65 FE) MG tablet Take 325 mg by mouth daily with breakfast.      . sucralfate (CARAFATE) 1 G tablet Take 1 tablet (1 g total) by mouth 4 (four) times daily.  40 tablet  0  . Norethin Ace-Eth Estrad-FE (MINASTRIN 24 FE PO) Take 1 tablet by mouth daily.       No  current facility-administered medications on file prior to visit.    Review of Systems:  As per HPI, otherwise negative.    Physical Examination: Filed Vitals:   09/02/12 0901  BP: 140/82  Pulse: 73  Temp: 98 F (36.7 C)  Resp: 16   Filed Vitals:   09/02/12 0901  Height: 5\' 4"  (1.626 m)  Weight: 328 lb (148.78 kg)   Body mass index is 56.27 kg/(m^2). Ideal Body Weight: Weight in (lb) to have BMI = 25: 145.3   GEN: morbid obesity, NAD, Non-toxic, Alert & Oriented x 3 HEENT: Atraumatic, Normocephalic.  Ears and Nose: No external deformity. EXTR: No clubbing/cyanosis/edema NEURO: Normal gait.  PSYCH: Normally interactive. Conversant. Not depressed or anxious appearing.  Calm demeanor.  CHEST:  CTA BS= EXT:   Extremely difficult to assess due obesity.  Has an area of superficial phlebitis in right lower leg laterally   Assessment and Plan: Phlebitis Venous doppler   Signed,  Phillips Odor, MD

## 2012-09-02 NOTE — Progress Notes (Signed)
VASCULAR LAB PRELIMINARY  PRELIMINARY  PRELIMINARY  PRELIMINARY  Bilateral lower extremity venous Dopplers completed.    Preliminary report:  There is no DVT noted in the bilateral lower extremities.  There are thrombosed varicosities noted in the right anterior thigh and knee.    Amir Fick, RVT 09/02/2012, 5:16 PM

## 2012-09-03 ENCOUNTER — Telehealth: Payer: Self-pay

## 2012-09-03 NOTE — Telephone Encounter (Signed)
Pt is wanting to talk with dr Dareen Piano about getting something called in that is stronger than what he recommended yestserday    Best number [phone]

## 2012-09-03 NOTE — Telephone Encounter (Signed)
Pt states that the ibuprofen is not working for her. She is taking it 400mg  every 4 hours.  Can she have something stronger?

## 2012-09-04 MED ORDER — HYDROCODONE-ACETAMINOPHEN 5-325 MG PO TABS: 1.0000 | ORAL_TABLET | ORAL | Status: DC | PRN

## 2012-09-04 NOTE — Addendum Note (Signed)
Addended by: Carmelina Dane on: 09/04/2012 03:06 PM   Modules accepted: Orders

## 2012-09-04 NOTE — Telephone Encounter (Signed)
Patient wants to know if she can have a stronger medication to help with her pain. [phone]

## 2012-09-04 NOTE — Telephone Encounter (Signed)
Multiple phone calls please advise patient c/o pain, states no relief with Ibuprofen

## 2012-09-04 NOTE — Telephone Encounter (Signed)
Patient advised.

## 2012-09-04 NOTE — Telephone Encounter (Signed)
Sent prescription that needs to be called in.  On printer

## 2013-03-19 ENCOUNTER — Ambulatory Visit (INDEPENDENT_AMBULATORY_CARE_PROVIDER_SITE_OTHER): Payer: No Typology Code available for payment source | Admitting: Family Medicine

## 2013-03-19 VITALS — BP 130/76 | HR 51 | Temp 97.7°F | Resp 16 | Ht 65.0 in | Wt 303.0 lb

## 2013-03-19 DIAGNOSIS — H6690 Otitis media, unspecified, unspecified ear: Secondary | ICD-10-CM

## 2013-03-19 DIAGNOSIS — J329 Chronic sinusitis, unspecified: Secondary | ICD-10-CM

## 2013-03-19 DIAGNOSIS — H6691 Otitis media, unspecified, right ear: Secondary | ICD-10-CM

## 2013-03-19 MED ORDER — AMOXICILLIN-POT CLAVULANATE 875-125 MG PO TABS: 1.0000 | ORAL_TABLET | Freq: Two times a day (BID) | ORAL | Status: DC

## 2013-03-19 MED ORDER — FLUCONAZOLE 150 MG PO TABS: 150.0000 mg | ORAL_TABLET | Freq: Once | ORAL | Status: DC

## 2013-03-19 NOTE — Progress Notes (Signed)
SUBJECTIVE: URI symptoms:  [name] is a 43 y.o. female who complains of URI symptoms present for past 15-18 days.  Describes rhinorrhea, sinus congestion.  Minimal to no cough.  Some chills.  Has had worsening ear pain for past several days on Right side.  Has tried Tylenol without relief.  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 No nausea or vomiting.  Denies smoking cigarettes.    OBJECTIVE: BP 130/76  Pulse 51  Temp(Src) 97.7 F (36.5 C)  Resp 16  Ht 5\' 5"  (1.651 m)  Wt 303 lb (137.44 kg)  BMI 50.42 kg/m2  SpO2 99%  LMP 02/25/2013 Gen:  Patient sitting on exam table, appears stated age in no acute distress Head: Normocephalic atraumatic Eyes: EOMI, PERRL, sclera and conjunctiva non-erythematous Ears:  Canals clear bilaterally.  TMs pearly gray Left side, bulging erythematous TM on the Right.  No pain with palpation of pinna   Nose:  Nasal turbinates grossly enlarged bilaterally. Some exudates noted. Tender to palpation of maxillary sinus  Mouth: Mucosa membranes moist. No oropharyngeal erythema noted Neck: No cervical lymphadenopathy noted Pulm:  Clear to auscultation bilaterally with good air movement.  No wheezes or rales noted.    Imp/Plan:  1.  Sinusitis/Right otitis media: - Augmentin to treat.   - Diflucan for presumptive yeast infection, history of these with amoxicillin.

## 2013-03-19 NOTE — Patient Instructions (Signed)
Increase your yogurt intake with the antibiotic.  Take the Augmentin twice daily for 10 days. If this is super expensive let us know and we can switch it.  Take Diflucan (yeast medicine) 1 pill in 2 days, second pill in 6 days, and last pill AFTER you finished the Augmentin.    It was good to see you.  Come back if you're not getting better, don't wait if you're getting worse.

## 2013-04-17 ENCOUNTER — Telehealth: Payer: Self-pay

## 2013-04-17 NOTE — Telephone Encounter (Signed)
Called her work and she was on another line. She will call back.

## 2013-04-17 NOTE — Telephone Encounter (Signed)
Pt was seen in February for sinus infection.  She says she is not sure if it ever completely went away.  She wonders if we could call her in something for it again on if she has to come back in.  Call work at [phone]

## 2013-04-17 NOTE — Telephone Encounter (Signed)
Spoke with pt and informed her that she should come back in to be re-evaluated since it has been a month since her last visit.

## 2013-08-09 ENCOUNTER — Telehealth: Payer: Self-pay

## 2013-08-09 NOTE — Telephone Encounter (Signed)
Called and left message with patient regarding release form we received via fax. [facility] and states that our office will be receiving records from them, however, we did not receive any records with form. Left a message with patient just to confirm that we were in fact supposed to be receiving those records from that office and that we were not supposed to be sending them anywhere. Asked patient to return call.

## 2013-08-13 NOTE — Telephone Encounter (Signed)
Left another message for patient to call back so we can get clarification and process her records request.

## 2013-08-14 NOTE — Telephone Encounter (Signed)
[facility].

## 2013-08-14 NOTE — Telephone Encounter (Signed)
Patient called back and left a message 08/13/13. Patient states that [facility]. Returned patient's call and advised her that we would send the records to [facility] and if she had any questions or concerns she could call us back.

## 2013-09-17 NOTE — H&P (Signed)
  Patient name  Iysis, Germain DICTATION#  [id number] CSN# [id number]  Juluis Mire, MD 09/17/2013 10:19 AM

## 2013-09-17 NOTE — H&P (Signed)
[name], [name]             ACCOUNT NO.:  [id number]  MEDICAL RECORD NO.:  [id number]  LOCATION:  [facility]                         FACILITY:  [facility]  PHYSICIAN:  Juluis Mire, M.D.   DATE OF BIRTH:  05-Mar-1970  DATE OF ADMISSION:  09/20/2013 DATE OF DISCHARGE:                             HISTORY & PHYSICAL   Date of her surgery is September 20, 2013, at [facility] here in [place].  HISTORY OF PRESENT ILLNESS:  The patient is a 43 year old gravida 1, para 1, divorced female, who presents for laparoscopic bilateral tubal ligation and NovaSure ablation.  She has been having extremely heavy flow with her periods with clots.  Hemoglobin has been depressed.  On last checked, it was 9.7, has been as low as 5.9.  She is on iron sulfate supplementation.  We had her on birth control pills but had to discontinue these because of superficial deep venous thrombosis. Previous saline infusion ultrasound was unremarkable except for finding suggestive of adenomyosis.  After discussing various options, the patient now presents for bilateral tubal ligation done laparoscopically and NovaSure ablation.  ALLERGIES:  The patient has no known drug allergies.  MEDICATIONS:  She is on atenolol for hypertension.  PAST MEDICAL HISTORY:  Usual childhood diseases, does have a history of hypertension and anemia.  PAST SURGICAL HISTORY:  She has had 1 cesarean section in January 2003; gastric bypass in January 2004; and gallbladder and hernia repair in July 2011.  SOCIAL HISTORY:  Reveals no tobacco or alcohol use.  FAMILY HISTORY:  Mother with a history of breast cancer.  There is history of diabetes, arthritis as well as heart disease.  REVIEW OF SYSTEMS:  Noncontributory.  PHYSICAL EXAMINATION:  VITAL SIGNS:  The patient is afebrile.  Stable vital signs. HEENT:  The patient normocephalic.  Pupils equal, round, and reactive to light and accommodation.  Extraocular movements are intact.   Sclerae and conjunctivae are clear.  Oropharynx clear. NECK:  Without thyromegaly. BREASTS:  No discrete masses. LUNGS:  Clear. CARDIAC SYSTEM:  Regular rhythm and rate with no murmurs or gallops. ABDOMEN:  Benign.  Previous surgical scars are noted. PELVIC:  Normal external genitalia.  Vaginal mucosa clear.  Cervix unremarkable.  Uterus normal size, shape, and contour.  Adnexa free of masses or tenderness.  IMPRESSION: 1. Menorrhagia with associated anemia. 2. Desires sterility. 3. Hypertension. 4. History of superficial phlebitis.  PLAN:  The patient will undergo laparoscopic bilateral tubal ligation followed by NovaSure ablation.  Risks of procedure have been discussed including the risk of infection.  The risk of vascular injury could lead to hemorrhage requiring transfusion with the risk of AIDS or hepatitis, excessive bleeding, could require hysterectomy.  Risk of injury to adjacent organs including bladder, bowel, ureters that could require further exploratory surgery.  Risk of deep venous thrombosis or pulmonary embolus.  Success rates of 85% are quoted.  For tubal, we discussed the potential irreversibility of sterilization.  Failure of 1 in 200 is quoted.  Failures can be in the form of ectopic pregnancy, requiring further surgical management.  The patient expressed understanding of the indications, risks, and other alternatives.     Juluis Mire, M.D.  JSM/MEDQ  D:  09/17/2013  T:  09/17/2013  Job:  [id number]

## 2013-09-18 ENCOUNTER — Encounter (HOSPITAL_COMMUNITY): Payer: Self-pay

## 2013-09-18 ENCOUNTER — Encounter (HOSPITAL_COMMUNITY)
Admission: RE | Admit: 2013-09-18 | Discharge: 2013-09-18 | Disposition: A | Discharge status: Home / Self Care | Payer: No Typology Code available for payment source | Source: Ambulatory Visit | Attending: Obstetrics and Gynecology | Admitting: Obstetrics and Gynecology

## 2013-09-18 DIAGNOSIS — D649 Anemia, unspecified: Secondary | ICD-10-CM | POA: Diagnosis not present

## 2013-09-18 DIAGNOSIS — N83209 Unspecified ovarian cyst, unspecified side: Secondary | ICD-10-CM | POA: Diagnosis not present

## 2013-09-18 DIAGNOSIS — N8000 Endometriosis of the uterus, unspecified: Secondary | ICD-10-CM | POA: Diagnosis not present

## 2013-09-18 DIAGNOSIS — N803 Endometriosis of pelvic peritoneum, unspecified: Secondary | ICD-10-CM | POA: Diagnosis not present

## 2013-09-18 DIAGNOSIS — Z6841 Body Mass Index (BMI) 40.0 and over, adult: Secondary | ICD-10-CM | POA: Diagnosis not present

## 2013-09-18 DIAGNOSIS — I10 Essential (primary) hypertension: Secondary | ICD-10-CM | POA: Diagnosis not present

## 2013-09-18 DIAGNOSIS — N920 Excessive and frequent menstruation with regular cycle: Secondary | ICD-10-CM | POA: Diagnosis present

## 2013-09-18 DIAGNOSIS — Z302 Encounter for sterilization: Secondary | ICD-10-CM | POA: Diagnosis not present

## 2013-09-18 HISTORY — DX: Other specified postprocedural states: Z98.890

## 2013-09-18 HISTORY — DX: Reserved for concepts with insufficient information to code with codable children: IMO0002

## 2013-09-18 HISTORY — DX: Nausea with vomiting, unspecified: R11.2

## 2013-09-18 HISTORY — DX: Unspecified asthma, uncomplicated: J45.909

## 2013-09-18 LAB — BASIC METABOLIC PANEL
Anion gap: 9 (ref 5–15)
BUN: 10 mg/dL (ref 6–23)
CO2: 27 meq/L (ref 19–32)
CREATININE: 0.52 mg/dL (ref 0.50–1.10)
Calcium: 8.5 mg/dL (ref 8.4–10.5)
Chloride: 103 mEq/L (ref 96–112)
GFR calc Af Amer: >90 mL/min (ref >90)
GFR calc non Af Amer: >90 mL/min (ref >90)
Glucose, Bld: 76 mg/dL (ref 70–99)
Potassium: 4.1 mEq/L (ref 3.7–5.3)
Sodium: 139 mEq/L (ref 137–147)

## 2013-09-18 LAB — CBC
HCT: 30.1 % — ABNORMAL LOW (ref 36.0–46.0)
Hemoglobin: 8.4 g/dL — ABNORMAL LOW (ref 12.0–15.0)
MCH: 19.4 pg — AB (ref 26.0–34.0)
MCHC: 27.9 g/dL — ABNORMAL LOW (ref 30.0–36.0)
MCV: 69.7 fL — ABNORMAL LOW (ref 78.0–100.0)
PLATELETS: 377 10*3/uL (ref 150–400)
RBC: 4.32 MIL/uL (ref 3.87–5.11)
RDW: 18.1 % — AB (ref 11.5–15.5)
WBC: 6.8 10*3/uL (ref 4.0–10.5)

## 2013-09-18 NOTE — Pre-Procedure Instructions (Signed)
Pt is [place]    Hx reviewed by Cristela Blue, MD, no orders given.

## 2013-09-18 NOTE — Patient Instructions (Signed)
20 [name]  09/18/2013   Your procedure is scheduled on:  09/20/13  Enter through the Main Entrance of [facility] at 6 AM.  Pick up the phone at the desk and dial [phone].   Call this number if you have problems the morning of surgery: [phone]   Remember:   Do not eat food:After Midnight.  Do not drink clear liquids: After Midnight.  Take these medicines the morning of surgery with A SIP OF WATER: Blood pressure medication   Do not wear jewelry, make-up or nail polish.  Do not wear lotions, powders, or perfumes. You may wear deodorant.  Do not shave 48 hours prior to surgery.  Do not bring valuables to the hospital.  [facility] is not   responsible for any belongings or valuables brought to the hospital.  Contacts, dentures or bridgework may not be worn into surgery.  Leave suitcase in the car. After surgery it may be brought to your room.  For patients admitted to the hospital, checkout time is 11:00 AM the day of              discharge.   Patients discharged the day of surgery will not be allowed to drive             home.  Name and phone number of your driver: NA  Special Instructions:      Please read over the following fact sheets that you were given:   Surgical Site Infection Prevention

## 2013-09-19 ENCOUNTER — Encounter (HOSPITAL_COMMUNITY): Payer: Self-pay

## 2013-09-19 MED ORDER — DEXTROSE 5 % IV SOLN
3.0000 g | INTRAVENOUS | Status: AC
  Administered 2013-09-20: 3 g via INTRAVENOUS
  Filled 2013-09-19: qty 3000

## 2013-09-19 NOTE — Anesthesia Preprocedure Evaluation (Addendum)
Anesthesia Evaluation  Patient identified by MRN, date of birth, ID band  Reviewed: Allergy & Precautions, H&P , NPO status , Patient's Chart, lab work & pertinent test results, reviewed documented beta blocker date and time   Airway Mallampati: I TM Distance: >3 FB Neck ROM: full    Dental no notable dental hx. (+) Teeth Intact   Pulmonary neg pulmonary ROS,          Cardiovascular hypertension, Pt. on home beta blockers     Neuro/Psych negative neurological ROS  negative psych ROS   GI/Hepatic negative GI ROS, Neg liver ROS,   Endo/Other  Morbid obesity  Renal/GU negative Renal ROS     Musculoskeletal   Abdominal Normal abdominal exam  (+)   Peds  Hematology negative hematology ROS (+) anemia , hgb 8.4 - chronic (d/w Dr Arelia Sneddon)   Anesthesia Other Findings   Reproductive/Obstetrics negative OB ROS                          Anesthesia Physical Anesthesia Plan  ASA: III  Anesthesia Plan: General   Post-op Pain Management:    Induction: Intravenous  Airway Management Planned: Oral ETT  Additional Equipment:   Intra-op Plan:   Post-operative Plan: Extubation in OR  Informed Consent: I have reviewed the patients History and Physical, chart, labs and discussed the procedure including the risks, benefits and alternatives for the proposed anesthesia with the patient or authorized representative who has indicated his/her understanding and acceptance.   Dental Advisory Given  Plan Discussed with: CRNA and Surgeon  Anesthesia Plan Comments:         Anesthesia Quick Evaluation

## 2013-09-20 ENCOUNTER — Encounter (HOSPITAL_COMMUNITY)
Admission: RE | Disposition: A | Discharge status: Home / Self Care | Payer: Self-pay | Source: Ambulatory Visit | Attending: Obstetrics and Gynecology

## 2013-09-20 ENCOUNTER — Encounter (HOSPITAL_COMMUNITY): Payer: No Typology Code available for payment source | Admitting: Anesthesiology

## 2013-09-20 ENCOUNTER — Ambulatory Visit (HOSPITAL_COMMUNITY): Payer: No Typology Code available for payment source | Admitting: Anesthesiology

## 2013-09-20 ENCOUNTER — Encounter (HOSPITAL_COMMUNITY): Payer: Self-pay | Admitting: *Deleted

## 2013-09-20 ENCOUNTER — Ambulatory Visit (HOSPITAL_COMMUNITY)
Admission: RE | Admit: 2013-09-20 | Discharge: 2013-09-20 | Disposition: A | Discharge status: Home / Self Care | Payer: No Typology Code available for payment source | Source: Ambulatory Visit | Attending: Obstetrics and Gynecology | Admitting: Obstetrics and Gynecology

## 2013-09-20 DIAGNOSIS — Z6841 Body Mass Index (BMI) 40.0 and over, adult: Secondary | ICD-10-CM | POA: Insufficient documentation

## 2013-09-20 DIAGNOSIS — Z302 Encounter for sterilization: Secondary | ICD-10-CM | POA: Insufficient documentation

## 2013-09-20 DIAGNOSIS — I10 Essential (primary) hypertension: Secondary | ICD-10-CM | POA: Insufficient documentation

## 2013-09-20 DIAGNOSIS — Z9889 Other specified postprocedural states: Secondary | ICD-10-CM

## 2013-09-20 DIAGNOSIS — N8000 Endometriosis of the uterus, unspecified: Secondary | ICD-10-CM | POA: Insufficient documentation

## 2013-09-20 DIAGNOSIS — N803 Endometriosis of pelvic peritoneum, unspecified: Secondary | ICD-10-CM | POA: Insufficient documentation

## 2013-09-20 DIAGNOSIS — N83209 Unspecified ovarian cyst, unspecified side: Secondary | ICD-10-CM | POA: Insufficient documentation

## 2013-09-20 DIAGNOSIS — N920 Excessive and frequent menstruation with regular cycle: Secondary | ICD-10-CM | POA: Diagnosis not present

## 2013-09-20 DIAGNOSIS — D649 Anemia, unspecified: Secondary | ICD-10-CM | POA: Insufficient documentation

## 2013-09-20 DIAGNOSIS — Z9851 Tubal ligation status: Secondary | ICD-10-CM

## 2013-09-20 HISTORY — PX: DILITATION & CURRETTAGE/HYSTROSCOPY WITH NOVASURE ABLATION: SHX5568

## 2013-09-20 HISTORY — PX: LAPAROSCOPIC TUBAL LIGATION: SHX1937

## 2013-09-20 LAB — TYPE AND SCREEN
ABO/RH(D): A POS
ANTIBODY SCREEN: NEGATIVE

## 2013-09-20 LAB — HCG, SERUM, QUALITATIVE: Preg, Serum: NEGATIVE

## 2013-09-20 LAB — ABO/RH: ABO/RH(D): A POS

## 2013-09-20 SURGERY — LIGATION, FALLOPIAN TUBE, LAPAROSCOPIC
Anesthesia: General | Site: Vagina

## 2013-09-20 MED ORDER — FENTANYL CITRATE 0.05 MG/ML IJ SOLN
INTRAMUSCULAR | Status: AC
  Filled 2013-09-20: qty 2

## 2013-09-20 MED ORDER — FENTANYL CITRATE 0.05 MG/ML IJ SOLN
INTRAMUSCULAR | Status: DC | PRN
  Administered 2013-09-20 (×2): 100 ug via INTRAVENOUS
  Administered 2013-09-20: 150 ug via INTRAVENOUS

## 2013-09-20 MED ORDER — LACTATED RINGERS IV SOLN
INTRAVENOUS | Status: DC
  Administered 2013-09-20: 06:00:00 via INTRAVENOUS

## 2013-09-20 MED ORDER — LIDOCAINE HCL (CARDIAC) 20 MG/ML IV SOLN
INTRAVENOUS | Status: DC | PRN
  Administered 2013-09-20: 50 mg via INTRAVENOUS

## 2013-09-20 MED ORDER — PROPOFOL INFUSION 10 MG/ML OPTIME
INTRAVENOUS | Status: DC | PRN
  Administered 2013-09-20: 100 ug/kg/min via INTRAVENOUS

## 2013-09-20 MED ORDER — DEXAMETHASONE SODIUM PHOSPHATE 10 MG/ML IJ SOLN
INTRAMUSCULAR | Status: DC | PRN
  Administered 2013-09-20: 4 mg via INTRAVENOUS

## 2013-09-20 MED ORDER — BUPIVACAINE HCL (PF) 0.25 % IJ SOLN
INTRAMUSCULAR | Status: AC
  Filled 2013-09-20: qty 30

## 2013-09-20 MED ORDER — ACETAMINOPHEN 160 MG/5ML PO SOLN
975.0000 mg | Freq: Once | ORAL | Status: AC
Start: 2013-09-20 — End: 2013-09-20
  Administered 2013-09-20: 975 mg via ORAL

## 2013-09-20 MED ORDER — FENTANYL CITRATE 0.05 MG/ML IJ SOLN
INTRAMUSCULAR | Status: AC
  Filled 2013-09-20: qty 5

## 2013-09-20 MED ORDER — GLYCOPYRROLATE 0.2 MG/ML IJ SOLN
INTRAMUSCULAR | Status: DC | PRN
  Administered 2013-09-20: 0.2 mg via INTRAVENOUS
  Administered 2013-09-20: 0.4 mg via INTRAVENOUS

## 2013-09-20 MED ORDER — ONDANSETRON HCL 4 MG/2ML IJ SOLN
INTRAMUSCULAR | Status: DC | PRN
  Administered 2013-09-20: 4 mg via INTRAVENOUS

## 2013-09-20 MED ORDER — LIDOCAINE HCL (CARDIAC) 20 MG/ML IV SOLN
INTRAVENOUS | Status: AC
  Filled 2013-09-20: qty 5

## 2013-09-20 MED ORDER — ONDANSETRON HCL 4 MG/2ML IJ SOLN
INTRAMUSCULAR | Status: AC
  Filled 2013-09-20: qty 2

## 2013-09-20 MED ORDER — PROPOFOL 10 MG/ML IV EMUL
INTRAVENOUS | Status: AC
  Filled 2013-09-20: qty 100

## 2013-09-20 MED ORDER — ROCURONIUM BROMIDE 100 MG/10ML IV SOLN
INTRAVENOUS | Status: AC
  Filled 2013-09-20: qty 1

## 2013-09-20 MED ORDER — ROCURONIUM BROMIDE 100 MG/10ML IV SOLN
INTRAVENOUS | Status: DC | PRN
  Administered 2013-09-20: 50 mg via INTRAVENOUS

## 2013-09-20 MED ORDER — ONDANSETRON HCL 4 MG/2ML IJ SOLN: 4.0000 mg | Freq: Once | INTRAMUSCULAR | Status: DC | PRN

## 2013-09-20 MED ORDER — NEOSTIGMINE METHYLSULFATE 10 MG/10ML IV SOLN
INTRAVENOUS | Status: DC | PRN
  Administered 2013-09-20: 2 mg via INTRAVENOUS

## 2013-09-20 MED ORDER — SCOPOLAMINE 1 MG/3DAYS TD PT72
1.0000 | MEDICATED_PATCH | Freq: Once | TRANSDERMAL | Status: DC | PRN
  Administered 2013-09-20: 1.5 mg via TRANSDERMAL

## 2013-09-20 MED ORDER — BUPIVACAINE HCL (PF) 0.25 % IJ SOLN
INTRAMUSCULAR | Status: DC | PRN
  Administered 2013-09-20: 7 mL

## 2013-09-20 MED ORDER — LIDOCAINE-EPINEPHRINE (PF) 1 %-1:200000 IJ SOLN
INTRAMUSCULAR | Status: AC
  Filled 2013-09-20: qty 10

## 2013-09-20 MED ORDER — ACETAMINOPHEN 160 MG/5ML PO SOLN
ORAL | Status: AC
  Administered 2013-09-20: 975 mg via ORAL
  Filled 2013-09-20: qty 40.6

## 2013-09-20 MED ORDER — PROPOFOL 10 MG/ML IV BOLUS
INTRAVENOUS | Status: DC | PRN
  Administered 2013-09-20: 50 mg via INTRAVENOUS
  Administered 2013-09-20: 200 mg via INTRAVENOUS

## 2013-09-20 MED ORDER — LACTATED RINGERS IV SOLN
INTRAVENOUS | Status: DC | PRN
  Administered 2013-09-20: 3000 mL via INTRAUTERINE

## 2013-09-20 MED ORDER — DEXAMETHASONE SODIUM PHOSPHATE 10 MG/ML IJ SOLN
INTRAMUSCULAR | Status: AC
  Filled 2013-09-20: qty 1

## 2013-09-20 MED ORDER — KETOROLAC TROMETHAMINE 30 MG/ML IJ SOLN: 15.0000 mg | Freq: Once | INTRAMUSCULAR | Status: DC | PRN

## 2013-09-20 MED ORDER — MEPERIDINE HCL 25 MG/ML IJ SOLN
6.2500 mg | INTRAMUSCULAR | Status: DC | PRN
Start: 2013-09-20 — End: 2013-09-20

## 2013-09-20 MED ORDER — MIDAZOLAM HCL 2 MG/2ML IJ SOLN
INTRAMUSCULAR | Status: DC | PRN
  Administered 2013-09-20: 2 mg via INTRAVENOUS

## 2013-09-20 MED ORDER — MIDAZOLAM HCL 2 MG/2ML IJ SOLN
INTRAMUSCULAR | Status: AC
  Filled 2013-09-20: qty 2

## 2013-09-20 MED ORDER — LIDOCAINE-EPINEPHRINE (PF) 1 %-1:200000 IJ SOLN
INTRAMUSCULAR | Status: DC | PRN
  Administered 2013-09-20: 10 mL

## 2013-09-20 MED ORDER — SCOPOLAMINE 1 MG/3DAYS TD PT72
MEDICATED_PATCH | TRANSDERMAL | Status: DC
Start: 2013-09-20 — End: 2013-09-20
  Administered 2013-09-20: 1.5 mg via TRANSDERMAL
  Filled 2013-09-20: qty 1

## 2013-09-20 MED ORDER — FENTANYL CITRATE 0.05 MG/ML IJ SOLN
25.0000 ug | INTRAMUSCULAR | Status: DC | PRN
  Administered 2013-09-20: 25 ug via INTRAVENOUS

## 2013-09-20 SURGICAL SUPPLY — 25 items
ABLATOR ENDOMETRIAL BIPOLAR (ABLATOR) ×3 IMPLANT
CATH ROBINSON RED A/P 16FR (CATHETERS) ×3 IMPLANT
CLOTH BEACON ORANGE TIMEOUT ST (SAFETY) ×3 IMPLANT
CONTAINER PREFILL 10% NBF 60ML (FORM) ×3 IMPLANT
DRAPE HYSTEROSCOPY (DRAPE) ×3 IMPLANT
DRSG COVADERM PLUS 2X2 (GAUZE/BANDAGES/DRESSINGS) ×3 IMPLANT
DRSG OPSITE POSTOP 3X4 (GAUZE/BANDAGES/DRESSINGS) ×3 IMPLANT
GLOVE BIO SURGEON STRL SZ7 (GLOVE) ×6 IMPLANT
GOWN STRL REUS W/TWL LRG LVL3 (GOWN DISPOSABLE) ×12 IMPLANT
NEEDLE INSUFFLATION 120MM (ENDOMECHANICALS) IMPLANT
NEEDLE SPNL 18GX3.5 QUINCKE PK (NEEDLE) ×3 IMPLANT
PACK LAPAROSCOPY BASIN (CUSTOM PROCEDURE TRAY) ×3 IMPLANT
PACK VAGINAL MINOR WOMEN LF (CUSTOM PROCEDURE TRAY) ×3 IMPLANT
PAD OB MATERNITY 4.3X12.25 (PERSONAL CARE ITEMS) ×3 IMPLANT
SET TUBING HYSTEROSCOPY 2 NDL (TUBING) ×3 IMPLANT
STRIP CLOSURE SKIN 1/4X3 (GAUZE/BANDAGES/DRESSINGS) IMPLANT
SUT VIC AB 3-0 FS2 27 (SUTURE) ×3 IMPLANT
SUT VICRYL 0 UR6 27IN ABS (SUTURE) ×6 IMPLANT
TOWEL OR 17X24 6PK STRL BLUE (TOWEL DISPOSABLE) ×6 IMPLANT
TROCAR BALLN 12MMX100 BLUNT (TROCAR) ×3 IMPLANT
TROCAR XCEL DIL TIP R 11M (ENDOMECHANICALS) IMPLANT
TUBE HYSTEROSCOPY W Y-CONNECT (TUBING) ×3 IMPLANT
TUBING INSUFFLATION W/FILTER (TUBING) ×3 IMPLANT
WARMER LAPAROSCOPE (MISCELLANEOUS) ×3 IMPLANT
WATER STERILE IRR 1000ML POUR (IV SOLUTION) ×3 IMPLANT

## 2013-09-20 NOTE — H&P (Signed)
  History and physical exam unchanged 

## 2013-09-20 NOTE — Anesthesia Procedure Notes (Addendum)
Performed by: Shanon Payor   Procedure Name: Intubation Date/Time: 09/20/2013 7:37 AM Performed by: Shanon Payor Pre-anesthesia Checklist: Patient identified, Emergency Drugs available, Suction available, Patient being monitored and Timeout performed Patient Re-evaluated:Patient Re-evaluated prior to inductionOxygen Delivery Method: Circle system utilized Preoxygenation: Pre-oxygenation with 100% oxygen Intubation Type: IV induction Ventilation: Mask ventilation without difficulty Laryngoscope Size: Mac and 3 Grade View: Grade I Tube type: Oral Tube size: 7.0 mm Number of attempts: 1 Airway Equipment and Method: Patient positioned with wedge pillow and Stylet Placement Confirmation: ETT inserted through vocal cords under direct vision,  positive ETCO2 and breath sounds checked- equal and bilateral Secured at: 21 cm Tube secured with: Tape Dental Injury: Teeth and Oropharynx as per pre-operative assessment  Difficulty Due To: Difficulty was anticipated Comments: Patient ramped on several blankets into adequate sniffing position, pre O2 for several minutes.

## 2013-09-20 NOTE — Op Note (Signed)
Patient name  [name], [name] DICTATION#  [id number] CSN# [id number]  Juluis Mire, MD 09/20/2013 8:33 AM

## 2013-09-20 NOTE — Discharge Instructions (Signed)
DISCHARGE INSTRUCTIONS: Laparoscopy  The following instructions have been prepared to help you care for yourself upon your return home today.  Wound care:  Do not get the incision wet for the first 24 hours. The incision should be kept clean and dry.  The Band-Aids or dressings may be removed the day after surgery.  Should the incision become sore, red, and swollen after the first week, check with your doctor.  Personal hygiene:  Shower the day after your procedure.  Activity and limitations:  Do NOT drive or operate any equipment today.  Do NOT lift anything more than 15 pounds for 2-3 weeks after surgery.  Do NOT rest in bed all day.  Walking is encouraged. Walk each day, starting slowly with 5-minute walks 3 or 4 times a day. Slowly increase the length of your walks.  Walk up and down stairs slowly.  Do NOT do strenuous activities, such as golfing, playing tennis, bowling, running, biking, weight lifting, gardening, mowing, or vacuuming for 2-4 weeks. Ask your doctor when it is okay to start.  Diet: Eat a light meal as desired this evening. You may resume your usual diet tomorrow.  Return to work: This is dependent on the type of work you do. For the most part you can return to a desk job within a week of surgery. If you are more active at work, please discuss this with your doctor.  What to expect after your surgery: You may have a slight burning sensation when you urinate on the first day. You may have a very small amount of blood in the urine. Expect to have a small amount of vaginal discharge/light bleeding for 1-2 weeks. It is not unusual to have abdominal soreness and bruising for up to 2 weeks. You may be tired and need more rest for about 1 week. You may experience shoulder pain for 24-72 hours. Lying flat in bed may relieve it.  Call your doctor for any of the following:  Develop a fever of 100.4 or greater  Inability to urinate 6 hours after discharge from  hospital  Severe pain not relieved by pain medications  Persistent of heavy bleeding at incision site  Redness or swelling around incision site after a week  Increasing nausea or vomiting  Patient Signature________________________________________ Nurse Signature_________________________________________DISCHARGE INSTRUCTIONS: HYSTEROSCOPY / ENDOMETRIAL ABLATION The following instructions have been prepared to help you care for yourself upon your return home.  Personal hygiene:  Use sanitary pads for vaginal drainage, not tampons.  Shower the day after your procedure.  NO tub baths, pools or Jacuzzis for 2-3 weeks.  Wipe front to back after using the bathroom.  Activity and limitations:  Do NOT drive or operate any equipment for 24 hours. The effects of anesthesia are still present and drowsiness may result.  Do NOT rest in bed all day.  Walking is encouraged.  Walk up and down stairs slowly.  You may resume your normal activity in one to two days or as indicated by your physician. Sexual activity: NO intercourse for at least 2 weeks after the procedure, or as indicated by your Doctor.  Diet: Eat a light meal as desired this evening. You may resume your usual diet tomorrow.  Return to Work: You may resume your work activities in one to two days or as indicated by Marine scientist.  What to expect after your surgery: Expect to have vaginal bleeding/discharge for 2-3 days and spotting for up to 10 days. It is not unusual to have  soreness for up to 1-2 weeks. You may have a slight burning sensation when you urinate for the first day. Mild cramps may continue for a couple of days. You may have a regular period in 2-6 weeks.  Call your doctor for any of the following:  Excessive vaginal bleeding or clotting, saturating and changing one pad every hour.  Inability to urinate 6 hours after discharge from hospital.  Pain not relieved by pain medication.  Fever of 100.4 F or  greater.  Unusual vaginal discharge or odor.  Return to office _________________Call for an appointment ___________________ Patients signature: ______________________ Nurses signature ________________________  Post Anesthesia Care Unit [phone]

## 2013-09-20 NOTE — Transfer of Care (Signed)
Immediate Anesthesia Transfer of Care Note  Patient: [name]  Procedure(s) Performed: Procedure(s): LAPAROSCOPIC TUBAL LIGATION (Bilateral) DILATATION & CURETTAGE/HYSTEROSCOPY WITH NOVASURE ABLATION (N/A)  Patient Location: PACU  Anesthesia Type:General  Level of Consciousness: awake, alert  and oriented  Airway & Oxygen Therapy: Patient Spontanous Breathing and Patient connected to nasal cannula oxygen  Post-op Assessment: Report given to PACU RN and Post -op Vital signs reviewed and stable  Post vital signs: Reviewed and stable  Complications: No apparent anesthesia complications

## 2013-09-20 NOTE — Brief Op Note (Signed)
09/20/2013  8:32 AM  PATIENT:  [name]  43 y.o. female  PRE-OPERATIVE DIAGNOSIS:  menorrhagia, desires sterility  POST-OPERATIVE DIAGNOSIS:  menorrhagia, desires sterility  PROCEDURE:  Procedure(s): LAPAROSCOPIC TUBAL LIGATION (Bilateral) DILATATION & CURETTAGE/HYSTEROSCOPY WITH NOVASURE ABLATION (N/A)  SURGEON:  Surgeon(s) and Role:    * Juluis Mire, MD - Primary  PHYSICIAN ASSISTANT:   ASSISTANTS: none   ANESTHESIA:   local, general and paracervical block  EBL:  Total I/O In: 1000 [I.V.:1000] Out: -   BLOOD ADMINISTERED:none  DRAINS: none   LOCAL MEDICATIONS USED:  MARCAINE    and XYLOCAINE   SPECIMEN:  Source of Specimen:  endometrial currettings  DISPOSITION OF SPECIMEN:  PATHOLOGY  COUNTS:  YES  TOURNIQUET:  * No tourniquets in log *  DICTATION: .Other Dictation: Dictation Number [id number]  PLAN OF CARE: Discharge to home after PACU  PATIENT DISPOSITION:  PACU - hemodynamically stable.   Delay start of Pharmacological VTE agent (>24hrs) due to surgical blood loss or risk of bleeding: not applicable

## 2013-09-21 NOTE — Op Note (Signed)
[name], [name]             ACCOUNT NO.:  [id number]  MEDICAL RECORD NO.:  [id number]  LOCATION:  [facility]                          FACILITY:  [facility]  PHYSICIAN:  Juluis Mire, M.D.   DATE OF BIRTH:  May 20, 1970  DATE OF PROCEDURE:  09/20/2013 DATE OF DISCHARGE:                              OPERATIVE REPORT   PREOPERATIVE DIAGNOSIS:  Menorrhagia secondary to adenomyosis.  Desires sterility.  POSTOPERATIVE DIAGNOSES:  Menorrhagia secondary to adenomyosis.  Desires sterility and pelvic endometriosis and the simple cyst of the left ovary.  OPERATIVE PROCEDURE:  Paracervical block.  Cervical dilation. Hysteroscopy.  Endometrial sampling.  NovaSure ablation.  Open laparoscopy with bilateral tubal fulguration.  SURGEON:  Juluis Mire, M.D.  ANESTHESIA:  Paracervical block as well as general anesthesia.  ESTIMATED BLOOD LOSS:  Minimal.  PACKS AND DRAINS:  None.  INTRAOPERATIVE BLOOD REPLACEMENT:  None.  COMPLICATIONS:  None.  INDICATIONS:  Dictated in the history and physical.  PROCEDURE IN DETAIL:  The patient was taken to the OR and placed in supine position.  Subsequently, she was placed in dorsal lithotomy position using the Allen stirrups.  The patient was then placed under general anesthesia.  Her abdomen, perineum, and vagina were prepped out with Betadine and draped appropriately.  The legs were repositioned.  A speculum was placed in the vaginal vault.  Cervix was identified and grasped with a single-tooth tenaculum.  Uterus sounded to 10 cm. Endocervical length was 4.5 cm giving [place] a cavity length of 5.5 cm. Hysteroscope was introduced.  Intrauterine cavity was distended using saline.  Visualization revealed a normal endometrium.  No polyps or over thickening.  Endometrial sampling was obtained and sent for Pathology. The NovaSure was brought into place, set to 5.5.  With this deployed, we had a cavity width of 4.7 cm.  We passed the CO2 patency  test. Subsequently, ablation was taken at a power setting of 142 for 113 seconds.  The NovaSure was then removed and intact.  Hysteroscopy revealed almost universal ablation.  The Hulka tenaculum was put in place.  Single-tooth tenaculum was then removed.  Bladder was emptied by in and out catheterization.  The patient's legs were repositioned. Subumbilical incision was made with knife and carried through subcutaneous tissue.  Fascia was identified and entered sharply, incision fashioned and extended laterally.  The peritoneum was entered with blunt finger pressure.  The open laparoscopic trocar was put in place and secured.  The abdomen was deflated with carbon dioxide. Laparoscope was introduced.  Uterus was elevated.  She did have implants of endometriosis in the cul-de-sac area along the right round ligament. She had a simple cyst of the left ovary.  Using the bipolar, 3 cm segment of each tube was coagulated.  Coagulation was continued until resistance read 0.  Then, the same segment of tube was re-coagulated completely desiccating the tube.  At the end of the procedure, adequate lengths of tubes were coagulated.  I could not see the appendix.  She did have omental adhesion to the upper abdomen from her previous gastric bypass surgery.  At this point in time, the lap and scope was removed. The abdomen was deflated with carbon dioxide.  The open laparoscopic trocars were removed.  The subumbilical fascia was closed with 2 figure- of-eights of 0 Vicryl.  Skin was closed with interrupted subcuticulars of 4-0 Vicryl.  Hulka tenaculum was then removed.  The patient was taken out of the dorsal lithotomy position.  Once alert and extubated was transferred to recovery room in good condition.  Sponge, instrument, and needle count was reported as correct by circulating nurse x2.     Juluis Mire, M.D.     JSM/MEDQ  D:  09/20/2013  T:  09/20/2013  Job:  [id number]

## 2013-09-22 ENCOUNTER — Encounter (HOSPITAL_COMMUNITY): Payer: Self-pay | Admitting: Obstetrics and Gynecology

## 2013-09-25 NOTE — Anesthesia Postprocedure Evaluation (Signed)
  Anesthesia Post-op Note  Patient: [name]  Procedure(s) Performed: Procedure(s): LAPAROSCOPIC TUBAL LIGATION (Bilateral) DILATATION & CURETTAGE/HYSTEROSCOPY WITH NOVASURE ABLATION (N/A) Patient is awake and responsive. Pain and nausea are reasonably well controlled. Vital signs are stable and clinically acceptable. Oxygen saturation is clinically acceptable. There are no apparent anesthetic complications at this time. Patient is ready for discharge.

## 2013-12-19 ENCOUNTER — Ambulatory Visit (INDEPENDENT_AMBULATORY_CARE_PROVIDER_SITE_OTHER): Payer: No Typology Code available for payment source

## 2013-12-19 ENCOUNTER — Ambulatory Visit (INDEPENDENT_AMBULATORY_CARE_PROVIDER_SITE_OTHER): Payer: No Typology Code available for payment source | Admitting: Emergency Medicine

## 2013-12-19 VITALS — BP 132/90 | HR 57 | Temp 97.8°F | Resp 18 | Ht 65.5 in | Wt 331.6 lb

## 2013-12-19 DIAGNOSIS — M25562 Pain in left knee: Secondary | ICD-10-CM

## 2013-12-19 MED ORDER — MELOXICAM 15 MG PO TABS: 15.0000 mg | ORAL_TABLET | Freq: Every day | ORAL | Status: DC

## 2013-12-19 MED ORDER — AMOXICILLIN 875 MG PO TABS: 875.0000 mg | ORAL_TABLET | Freq: Two times a day (BID) | ORAL | Status: DC

## 2013-12-19 MED ORDER — METOPROLOL TARTRATE 50 MG PO TABS: 50.0000 mg | ORAL_TABLET | Freq: Every day | ORAL | Status: DC

## 2013-12-19 MED ORDER — FLUCONAZOLE 150 MG PO TABS: 150.0000 mg | ORAL_TABLET | Freq: Once | ORAL | Status: DC

## 2013-12-19 NOTE — Patient Instructions (Signed)

## 2013-12-19 NOTE — Progress Notes (Signed)
   Subjective:    Patient ID: [name], female    DOB: 06/06/1970, 43 y.o.   MRN: [id number] This chart was scribed for Lesle ChrisSteven Yosgart Pavey, MD by Jolene Provostobert Halas, Medical Scribe. This patient was seen in Room 12 and the patient's care was started at 11:59 AM.  Chief Complaint  Patient presents with  . Knee Pain    L Knee pain - she fall in July, but now the pain is constantly  . Foot Pain    Pain on top of L Foot x 2 days -[facility]-  . Sinusitis    Pain in Rt side and drainage x 3 weeks  . Medication Refill    lopressor    HPI HPI Comments: Amy LatheMelissa I Deery is a 43 y.o. female who [facility] of sinus pain that started three weeks ago. Pt states she has chronic sinus infections due to pet allergies. Pr endorses rhinorrhea with clear drainage.  Pt takes Flonase and zyrtec. Pt has had a tubal ligation.  Pt is also complaining of left knee and foot pain that started several months ago after a fall, that has become recently worse. Pt states the pain in the knee is a stabbing pain.  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    Review of Systems  HENT: Positive for congestion, rhinorrhea and sinus pressure.   Musculoskeletal:       Left knee and foot pain.       Objective:   Physical Exam  Constitutional: She is oriented to person, place, and time. She appears well-developed and well-nourished.  Morbidly obese.  HENT:  Head: Normocephalic and atraumatic.  Swollen turbinates.  Eyes: Pupils are equal, round, and reactive to light.  Neck: Neck supple.  Cardiovascular: Normal rate, regular rhythm and normal heart sounds.   No murmur heard. Pulmonary/Chest: Effort normal and breath sounds normal. No respiratory distress. She has no wheezes.  Musculoskeletal:  Tender over the medial joint space with positive McMurray.  Neurological: She is alert and oriented to person, place, and time.  Skin: Skin is warm and dry.  Psychiatric: She has a normal mood and affect. Her behavior is normal.    Nursing note and vitals reviewed. [facility] reading (PRIMARY) by  Dr.Rayn Enderson no acute disease knee films look normal      Assessment & Plan:  Blood pressure medication was refilled she will be placed on meloxicam on a daily basis for 10 days she will use ice to the knee. She will also be given amoxicillin for 10 days for her sinus infection along with a prescription for Diflucan. Recheck if symptoms persist.I personally performed the services described in this documentation, which was scribed in my presence. The recorded information has been reviewed and is accurate.

## 2014-01-15 ENCOUNTER — Telehealth: Payer: Self-pay

## 2014-01-15 NOTE — Telephone Encounter (Signed)
Patient was seen last month and is requesting antibiotics for same thing and meloxicam (MOBIC) 15 MG tablet   [facility] - [address]   [phone] work  [phone]  cell

## 2014-01-16 NOTE — Telephone Encounter (Signed)
Pt needs to RTC Pt has been advised.

## 2014-02-20 ENCOUNTER — Ambulatory Visit: Payer: No Typology Code available for payment source | Admitting: Internal Medicine

## 2014-03-05 ENCOUNTER — Encounter: Payer: Self-pay | Admitting: Internal Medicine

## 2014-03-05 ENCOUNTER — Other Ambulatory Visit (INDEPENDENT_AMBULATORY_CARE_PROVIDER_SITE_OTHER): Payer: 59

## 2014-03-05 ENCOUNTER — Ambulatory Visit (INDEPENDENT_AMBULATORY_CARE_PROVIDER_SITE_OTHER): Payer: 59 | Admitting: Internal Medicine

## 2014-03-05 VITALS — BP 124/88 | HR 60 | Temp 98.0°F | Resp 12 | Ht 65.0 in | Wt 339.8 lb

## 2014-03-05 DIAGNOSIS — J4520 Mild intermittent asthma, uncomplicated: Secondary | ICD-10-CM

## 2014-03-05 DIAGNOSIS — Z9884 Bariatric surgery status: Secondary | ICD-10-CM

## 2014-03-05 DIAGNOSIS — J3089 Other allergic rhinitis: Secondary | ICD-10-CM | POA: Insufficient documentation

## 2014-03-05 DIAGNOSIS — K219 Gastro-esophageal reflux disease without esophagitis: Secondary | ICD-10-CM | POA: Insufficient documentation

## 2014-03-05 DIAGNOSIS — J3081 Allergic rhinitis due to animal (cat) (dog) hair and dander: Secondary | ICD-10-CM

## 2014-03-05 DIAGNOSIS — J4530 Mild persistent asthma, uncomplicated: Secondary | ICD-10-CM | POA: Insufficient documentation

## 2014-03-05 DIAGNOSIS — Z418 Encounter for other procedures for purposes other than remedying health state: Secondary | ICD-10-CM

## 2014-03-05 DIAGNOSIS — Z299 Encounter for prophylactic measures, unspecified: Secondary | ICD-10-CM

## 2014-03-05 DIAGNOSIS — Z23 Encounter for immunization: Secondary | ICD-10-CM

## 2014-03-05 DIAGNOSIS — M25562 Pain in left knee: Secondary | ICD-10-CM | POA: Insufficient documentation

## 2014-03-05 DIAGNOSIS — I10 Essential (primary) hypertension: Secondary | ICD-10-CM | POA: Insufficient documentation

## 2014-03-05 LAB — CBC
HEMATOCRIT: 37.3 % (ref 36.0–46.0)
Hemoglobin: 12 g/dL (ref 12.0–15.0)
MCHC: 32.1 g/dL (ref 30.0–36.0)
MCV: 80.8 fl (ref 78.0–100.0)
PLATELETS: 257 10*3/uL (ref 150.0–400.0)
RBC: 4.62 Mil/uL (ref 3.87–5.11)
RDW: 16.6 % — ABNORMAL HIGH (ref 11.5–15.5)
WBC: 6.1 10*3/uL (ref 4.0–10.5)

## 2014-03-05 LAB — COMPREHENSIVE METABOLIC PANEL
ALK PHOS: 78 U/L (ref 39–117)
ALT: 11 U/L (ref 0–35)
AST: 17 U/L (ref 0–37)
Albumin: 3.5 g/dL (ref 3.5–5.2)
BILIRUBIN TOTAL: 0.6 mg/dL (ref 0.2–1.2)
BUN: 13 mg/dL (ref 6–23)
CO2: 29 mEq/L (ref 19–32)
CREATININE: 0.49 mg/dL (ref 0.40–1.20)
Calcium: 8.6 mg/dL (ref 8.4–10.5)
Chloride: 106 mEq/L (ref 96–112)
GFR: 146.82 mL/min (ref >60.00)
Glucose, Bld: 82 mg/dL (ref 70–99)
Potassium: 3.9 mEq/L (ref 3.5–5.1)
Sodium: 140 mEq/L (ref 135–145)
Total Protein: 6.2 g/dL (ref 6.0–8.3)

## 2014-03-05 LAB — IRON AND TIBC
%SAT: 10 % — ABNORMAL LOW (ref 20–55)
Iron: 43 ug/dL (ref 42–145)
TIBC: 434 ug/dL (ref 250–470)
UIBC: 391 ug/dL (ref 125–400)

## 2014-03-05 LAB — LIPID PANEL
Cholesterol: 134 mg/dL (ref 0–200)
HDL: 65.6 mg/dL (ref >39.00)
LDL Cholesterol: 61 mg/dL (ref 0–99)
NonHDL: 68.4
TRIGLYCERIDES: 37 mg/dL (ref 0.0–149.0)
Total CHOL/HDL Ratio: 2
VLDL: 7.4 mg/dL (ref 0.0–40.0)

## 2014-03-05 LAB — VITAMIN B12: Vitamin B-12: 118 pg/mL — ABNORMAL LOW (ref 211–911)

## 2014-03-05 LAB — FOLATE: Folate: 18 ng/mL (ref >5.9)

## 2014-03-05 LAB — FERRITIN: Ferritin: 3.1 ng/mL — ABNORMAL LOW (ref 10.0–291.0)

## 2014-03-05 LAB — HEMOGLOBIN A1C: HEMOGLOBIN A1C: 5.8 % (ref 4.6–6.5)

## 2014-03-05 MED ORDER — ALPRAZOLAM 0.25 MG PO TABS: 0.2500 mg | ORAL_TABLET | Freq: Every day | ORAL | Status: DC | PRN

## 2014-03-05 MED ORDER — HYOSCYAMINE SULFATE 0.125 MG PO TABS: 0.1250 mg | ORAL_TABLET | ORAL | Status: DC | PRN

## 2014-03-05 NOTE — Progress Notes (Signed)
Pre visit review using our clinic review tool, if applicable. No additional management support is needed unless otherwise documented below in the visit note. 

## 2014-03-05 NOTE — Assessment & Plan Note (Signed)
Does take multivitamin and iron pills daily. Will check CBC, anemia panel, CMP, copper.

## 2014-03-05 NOTE — Assessment & Plan Note (Signed)
Patient is taking metoprolol 50 mg daily and this seems to be doing okay for her blood pressure. Good control today. Check kidney function.

## 2014-03-05 NOTE — Assessment & Plan Note (Addendum)
BMI >50 and limits her life. She is currently having complications including HTN. Will check lipid panel and HgA1c. Encouraged water aerobics for exercise. She has had gastric bypass in the past and still having weight problems.

## 2014-03-05 NOTE — Assessment & Plan Note (Signed)
Per patient she has not had any flares in the last year. She is having a pit bull live with her until her son's house is built and this really aggravates her allergies which affect her breathing. Uses rescue 1-2 times per week. Leaving the allergen helps her breathing. Never intubated and denies hospitalization for her asthma.

## 2014-03-05 NOTE — Progress Notes (Signed)
   Subjective:    Patient ID: [name], female    DOB: 29-Dec-1970, 44 y.o.   MRN: [id number]  HPI The patient is a 44 YO female who is coming in today with knee pain. She had fallen back in October or November onto her left knee. She had pain in it afterwards with some slight swelling. She sought care at urgent care where she had an x-ray. This is reviewed and explained to her during today's visit. She has not gotten back to normal and is still having pain in the knee. Worse with standing. Pain 3-4/10 and 10/10 with standing during exertion. She is not able to exercise right now. She denies instability of the knee or collapsing on her. She is not sure if it is swollen today. She denies any other new complaints Please see problem list for status and treatment of chronic conditions.   PMH, FMH, social history, PSH, medications, allergies, problem list reviewed and updated during today's visit.   Review of Systems  Constitutional: Positive for activity change. Negative for fever, appetite change, fatigue and unexpected weight change.  HENT: Negative for congestion, postnasal drip, rhinorrhea and sinus pressure.   Eyes: Negative.   Respiratory: Negative for cough, chest tightness, shortness of breath and wheezing.   Cardiovascular: Negative for chest pain, palpitations and leg swelling.  Gastrointestinal: Negative for nausea, abdominal pain, diarrhea, constipation and abdominal distention.       Mild intermittent GERD  Endocrine: Negative.   Musculoskeletal: Positive for arthralgias and gait problem. Negative for myalgias and back pain.  Skin: Negative.   Neurological: Negative.   Psychiatric/Behavioral: Negative.       Objective:   Physical Exam  Constitutional: She is oriented to person, place, and time. She appears well-developed and well-nourished.  Obese  HENT:  Head: Normocephalic and atraumatic.  Right Ear: External ear normal.  Left Ear: External ear normal.  Eyes: EOM are  normal.  Neck: Normal range of motion.  Cardiovascular: Normal rate and regular rhythm.   Pulmonary/Chest: Effort normal and breath sounds normal. No respiratory distress. She has no wheezes. She has no rales.  Abdominal: Soft. Bowel sounds are normal. She exhibits no distension.  Musculoskeletal: She exhibits tenderness.  Tender on the inferior aspect of the patella, no MCL or LCL ligament tenderness. Exam limited due to habitus but no obvious ACL or PCL tear.   Neurological: She is alert and oriented to person, place, and time.  Slow to stand, uses arms to push up.  Skin: Skin is warm and dry.  Psychiatric: She has a normal mood and affect. Her behavior is normal.   Filed Vitals:   03/05/14 0804  BP: 124/88  Pulse: 60  Temp: 98 F (36.7 C)  TempSrc: Oral  Resp: 12  Height: 5\' 5"  (1.651 m)  Weight: 339 lb 12.8 oz (154.132 kg)  SpO2: 98%      Assessment & Plan:

## 2014-03-05 NOTE — Assessment & Plan Note (Signed)
Taking zantac which is doing reasonably well. Likely her weight is a complicating factor is her acid reflux.

## 2014-03-05 NOTE — Assessment & Plan Note (Signed)
No obvious arthritis noted on x-ray (reviewed and independently reviewed during visit). No signs of ligament tear on exam. We talked about the weight as a cause of continued pain. Will refer to orthopedic surgery to see if further imaging or treatment is appropriate.

## 2014-03-05 NOTE — Assessment & Plan Note (Signed)
Currently taking flonase. Reasonable control.

## 2014-03-05 NOTE — Patient Instructions (Addendum)
We will check your blood work today to make sure that all your vitamin levels are okay. We will also check on the cholesterol.   We have given you the flu shot today. We will send you to an orthopedic doctor for the knee to see why it is not getting better.   You do need to be exercising 3-4 times per week to keep yourself healthy and water aerobics is a good way to get in exercise.   Come back in about 6 months to check in.

## 2014-03-06 ENCOUNTER — Other Ambulatory Visit: Payer: 59

## 2014-03-07 ENCOUNTER — Other Ambulatory Visit (INDEPENDENT_AMBULATORY_CARE_PROVIDER_SITE_OTHER): Payer: 59

## 2014-03-07 ENCOUNTER — Encounter: Payer: Self-pay | Admitting: Internal Medicine

## 2014-03-07 ENCOUNTER — Encounter: Payer: Self-pay | Admitting: Family Medicine

## 2014-03-07 ENCOUNTER — Ambulatory Visit (INDEPENDENT_AMBULATORY_CARE_PROVIDER_SITE_OTHER): Payer: 59 | Admitting: Family Medicine

## 2014-03-07 VITALS — BP 114/78 | HR 60 | Ht 65.0 in | Wt 335.0 lb

## 2014-03-07 DIAGNOSIS — M25562 Pain in left knee: Secondary | ICD-10-CM

## 2014-03-07 NOTE — Assessment & Plan Note (Signed)
Patient's left knee pain is difficult to assess secondary to patient's body habitus. It does appear the patient does have a significant amount is likely secondary to a patellofemoral syndrome as well as some early osteophytic changes. This is likely secondary to patient's body habitus. In addition of this patient does have laxity of the PCL which could give her some increased mobility that could also be causing some difficulty. We discussed bracing but once again secondary to patient's size this was not manageable today oral will not be comfortable even with a custom brace. Patient was given topical anti-inflammatory's, icing, and patient did work without leg drainage today to learn some strengthening exercises for the quadricep and hamstring. Patient then is going to come back and see me again in 3 weeks. If continuing her give us difficult he then we'll consider injection.

## 2014-03-07 NOTE — Patient Instructions (Signed)
Good to see you Ice 20 minutes 2 times daily. Usually after activity and before bed. Exercises 3 times a week.  Try pennsiad twice daily.  Vitamin  D 2000IU daily.  See me again in 3 weeks.

## 2014-03-07 NOTE — Progress Notes (Signed)
Pre visit review using our clinic review tool, if applicable. No additional management support is needed unless otherwise documented below in the visit note. 

## 2014-03-07 NOTE — Progress Notes (Signed)
[facility] [address] [place], [place] [address] Phone: [phone] Subjective:    I'm seeing this patient by the request  of:  Judie Bonus, MD   CC: Left knee pain  BJY:[id number] [name] is a 44 y.o. female coming in with complaint of left knee pain. Patient states that she has had this pain for multiple months. Patient was seen in December when she fell in July and started having constant pain. Patient was given anti-inflammatories and discussed icing. Patient did not follow-up with another provider for this problem. Patient states overall patient continues to have intermittent pain. Patient denies that this constant. Stopping her from some activity. Patient has had 2 episodes where the knee seemed to lock in an extended position. Patient states at the end of day she can difficulty most pain lifting her leg. Patient denies any hip pain. States dull aching pain, no radiation. No numbness, still able to do ADL's     Past medical history, social, surgical and family history all reviewed in electronic medical record.   Review of Systems: No headache, visual changes, nausea, vomiting, diarrhea, constipation, dizziness, abdominal pain, skin rash, fevers, chills, night sweats, weight loss, swollen lymph nodes, body aches, joint swelling, muscle aches, chest pain, shortness of breath, mood changes.   Objective Blood pressure 114/78, pulse 60, height  (1.651 m), weight 335 lb (151.955 kg), SpO2 98 %.  General: No apparent distress alert and oriented x3 mood and affect normal, dressed appropriately. Severly obese.  HEENT: Pupils equal, extraocular movements intact  Respiratory: Patient's speak in full sentences and does not appear short of breath  Cardiovascular: No lower extremity edema, non tender, no erythema  Skin: Warm dry intact with no signs of infection or rash on extremities or on axial skeleton.  Abdomen: Soft nontender  Neuro:  Cranial nerves II through XII are intact, neurovascularly intact in all extremities with 2+ DTRs and 2+ pulses.  Lymph: No lymphadenopathy of posterior or anterior cervical chain or axillae bilaterally.  Gait normal with good balance and coordination.  MSK:  Non tender with full range of motion and good stability and symmetric strength and tone of shoulders, elbows, wrist, hip, and ankles bilaterally.  Knee: Right Inspection is difficult to assess secondary to patient's body habitus Patient is tender to palpation over the patella ROM full in flexion and extension and lower leg rotation. She neck she has extreme extension of the knees bilaterally left than right. Ligaments with solid consistent endpoints including ACL,  LCL, MCL. PCL laxity compared to the contralateral side but endpoint still noted. Negative Mcmurray's, Apley's, and Thessalonian tests. painful patellar compression with J tracking. Patellar glide without crepitus. Patellar and quadriceps tendons unremarkable. Hamstring and quadriceps strength is normal.   MSK US performed of: Left knee This study was ordered, performed, and interpreted by Terrilee Files D.O.  Knee: All structures visualized. Patient does have narrowing of the joint lines bilaterally. Patient does have medial and lateral moderate arthritis. Patient does have degenerative changes of the meniscal tears. Patellar Tendon unremarkable on long and transverse views without effusion. Patient does have some hypoechoic changes surrounding the patella itself. No abnormality of prepatellar bursa. LCL and MCL unremarkable on long and transverse views. No abnormality of origin of medial or lateral head of the gastrocnemius.  IMPRESSION: Patellofemoral syndrome,   Procedure note 97110; 15 minutes spent for Therapeutic exercises as stated in above notes.  This included exercises focusing on stretching,  strengthening, with significant focus on eccentric aspects.   Proper  technique shown and discussed handout in great detail with ATC.  All questions were discussed and answered.      Impression and Recommendations:     This case required medical decision making of moderate complexity.

## 2014-03-15 LAB — COPPER, FREE: Copper, Free: 340 mcg/L

## 2014-03-28 ENCOUNTER — Ambulatory Visit (INDEPENDENT_AMBULATORY_CARE_PROVIDER_SITE_OTHER): Payer: 59 | Admitting: Family Medicine

## 2014-03-28 ENCOUNTER — Encounter: Payer: Self-pay | Admitting: Family Medicine

## 2014-03-28 VITALS — BP 118/82 | HR 63 | Ht 65.0 in | Wt 335.0 lb

## 2014-03-28 DIAGNOSIS — M25562 Pain in left knee: Secondary | ICD-10-CM

## 2014-03-28 NOTE — Progress Notes (Signed)
[name] Deidrea [facility] [address] [place], [place] [address] Phone: [phone] Subjective:    I'm seeing this patient by the request  of:  Judie BonusKollar, Elizabeth A, MD   CC: Left knee pain follow-up  BJY:NWGNFAOZHYHPI:Subjective Amy BayleyMelissa I Townsend [name] is a 44 y.o. female coming in with complaint of left knee pain. Patient was seen previously and had more of a patellofemoral syndrome. Patient had x-rays previously that did not show any significant bony abnormality. Patient has been trying to do conservative therapy with icing, home exercises and bracing intermittently. Patient states that she has made approximately a 25% improvement. Still has difficulty when going up or down steps. States that he can be a severe pain if she bends the knee too much. Denies any giving out on her but sometimes does feel unstable still.     Past medical history, social, surgical and family history all reviewed in electronic medical record.   Review of Systems: No headache, visual changes, nausea, vomiting, diarrhea, constipation, dizziness, abdominal pain, skin rash, fevers, chills, night sweats, weight loss, swollen lymph nodes, body aches, joint swelling, muscle aches, chest pain, shortness of breath, mood changes.   Objective Blood pressure 118/82, pulse 63, height 5\' 5"  (1.651 m), weight 335 lb (151.955 kg), SpO2 97 %.  General: No apparent distress alert and oriented x3 mood and affect normal, dressed appropriately. Severly obese.  HEENT: Pupils equal, extraocular movements intact  Respiratory: Patient's speak in full sentences and does not appear short of breath  Cardiovascular: No lower extremity edema, non tender, no erythema  Skin: Warm dry intact with no signs of infection or rash on extremities or on axial skeleton.  Abdomen: Soft nontender  Neuro: Cranial nerves II through XII are intact, neurovascularly intact in all extremities with 2+ DTRs and 2+ pulses.  Lymph: No lymphadenopathy of posterior  or anterior cervical chain or axillae bilaterally.  Gait normal with good balance and coordination.  MSK:  Non tender with full range of motion and good stability and symmetric strength and tone of shoulders, elbows, wrist, hip, and ankles bilaterally.  Knee: Right Inspection is difficult to assess secondary to patient's body habitus Patient is tender to palpation over the patella ROM full in flexion and extension and lower leg rotation. She neck she has extreme extension of the knees bilaterally left than right. Ligaments with solid consistent endpoints including ACL,  LCL, MCL. PCL laxity compared to the contralateral side but endpoint still noted. Negative Mcmurray's, Apley's, and Thessalonian tests. painful patellar compression with J tracking. Patellar glide without crepitus. Patellar and quadriceps tendons unremarkable. Hamstring and quadriceps strength is normal.   Procedure: Real-time Ultrasound Guided Injection of right knee Device: GE Logiq E  Ultrasound guided injection is preferred based studies that show increased duration, increased effect, greater accuracy, decreased procedural pain, increased response rate, and decreased cost with ultrasound guided versus blind injection.  Verbal informed consent obtained.  Time-out conducted.  Noted no overlying erythema, induration, or other signs of local infection.  Skin prepped in a sterile fashion.  Local anesthesia: Topical Ethyl chloride.  With sterile technique and under real time ultrasound guidance: With a 22-gauge 2 inch needle patient was injected with 4 cc of 0.5% Marcaine and 1 cc of Kenalog 40 mg/dL. This was from a superior lateral approach.  Completed without difficulty  Pain immediately resolved suggesting accurate placement of the medication.  Advised to call if fevers/chills, erythema, induration, drainage, or persistent bleeding.  Images permanently stored and  available for review in the ultrasound unit.  Impression:  Technically successful ultrasound guided injection.    Impression and Recommendations:     This case required medical decision making of moderate complexity.

## 2014-03-28 NOTE — Progress Notes (Signed)
Pre visit review using our clinic review tool, if applicable. No additional management support is needed unless otherwise documented below in the visit note. 

## 2014-03-28 NOTE — Assessment & Plan Note (Signed)
I still believe the patient's left knee pain is likely secondary to a patellofemoral syndrome. Differential includes intra-articular pathology such as an MRI as well as the mild laxity that is appreciated with patients PCL. We discussed though that due to patient's body habitus the ultrasound may not be the best imaging an MRI may be necessary. Lucille patient response to the injection and patient was sent to formal physical therapy. Patient and will come back again in 3-4 weeks for further evaluation.

## 2014-03-28 NOTE — Patient Instructions (Signed)
Good to see you You are going to start physical therapy ICe is still your friend.  Continue the vitamins as well See me again in 3 weeks if still in pain we will consider an mri.

## 2014-03-29 ENCOUNTER — Other Ambulatory Visit: Payer: Self-pay | Admitting: Emergency Medicine

## 2014-04-13 ENCOUNTER — Emergency Department (HOSPITAL_COMMUNITY): Payer: 59

## 2014-04-13 ENCOUNTER — Emergency Department (HOSPITAL_COMMUNITY)
Admission: EM | Admit: 2014-04-13 | Discharge: 2014-04-13 | Disposition: A | Discharge status: Home / Self Care | Payer: 59 | Attending: Emergency Medicine | Admitting: Emergency Medicine

## 2014-04-13 ENCOUNTER — Encounter (HOSPITAL_COMMUNITY): Payer: Self-pay | Admitting: *Deleted

## 2014-04-13 DIAGNOSIS — Z7951 Long term (current) use of inhaled steroids: Secondary | ICD-10-CM | POA: Insufficient documentation

## 2014-04-13 DIAGNOSIS — R002 Palpitations: Secondary | ICD-10-CM | POA: Diagnosis not present

## 2014-04-13 DIAGNOSIS — R0789 Other chest pain: Secondary | ICD-10-CM | POA: Diagnosis not present

## 2014-04-13 DIAGNOSIS — E669 Obesity, unspecified: Secondary | ICD-10-CM | POA: Diagnosis not present

## 2014-04-13 DIAGNOSIS — M79602 Pain in left arm: Secondary | ICD-10-CM | POA: Diagnosis not present

## 2014-04-13 DIAGNOSIS — J45909 Unspecified asthma, uncomplicated: Secondary | ICD-10-CM | POA: Insufficient documentation

## 2014-04-13 DIAGNOSIS — D649 Anemia, unspecified: Secondary | ICD-10-CM | POA: Diagnosis not present

## 2014-04-13 DIAGNOSIS — I10 Essential (primary) hypertension: Secondary | ICD-10-CM | POA: Insufficient documentation

## 2014-04-13 DIAGNOSIS — Z79899 Other long term (current) drug therapy: Secondary | ICD-10-CM | POA: Insufficient documentation

## 2014-04-13 DIAGNOSIS — R079 Chest pain, unspecified: Secondary | ICD-10-CM | POA: Diagnosis present

## 2014-04-13 HISTORY — DX: Anemia, unspecified: D64.9

## 2014-04-13 HISTORY — DX: Obesity, unspecified: E66.9

## 2014-04-13 LAB — I-STAT TROPONIN, ED
TROPONIN I, POC: 0 ng/mL (ref 0.00–0.08)
Troponin i, poc: 0 ng/mL (ref 0.00–0.08)

## 2014-04-13 LAB — CBC
HCT: 38.8 % (ref 36.0–46.0)
HEMOGLOBIN: 12.3 g/dL (ref 12.0–15.0)
MCH: 26.9 pg (ref 26.0–34.0)
MCHC: 31.7 g/dL (ref 30.0–36.0)
MCV: 84.9 fL (ref 78.0–100.0)
Platelets: 351 10*3/uL (ref 150–400)
RBC: 4.57 MIL/uL (ref 3.87–5.11)
RDW: 15.4 % (ref 11.5–15.5)
WBC: 6.4 10*3/uL (ref 4.0–10.5)

## 2014-04-13 LAB — BASIC METABOLIC PANEL
Anion gap: 6 (ref 5–15)
BUN: 11 mg/dL (ref 6–23)
CO2: 25 mmol/L (ref 19–32)
Calcium: 8.7 mg/dL (ref 8.4–10.5)
Chloride: 107 mmol/L (ref 96–112)
Creatinine, Ser: 0.62 mg/dL (ref 0.50–1.10)
GFR calc Af Amer: >90 mL/min (ref >90)
GFR calc non Af Amer: >90 mL/min (ref >90)
GLUCOSE: 86 mg/dL (ref 70–99)
POTASSIUM: 3.7 mmol/L (ref 3.5–5.1)
Sodium: 138 mmol/L (ref 135–145)

## 2014-04-13 MED ORDER — ASPIRIN 81 MG PO CHEW
324.0000 mg | CHEWABLE_TABLET | Freq: Once | ORAL | Status: AC
  Administered 2014-04-13: 324 mg via ORAL
  Filled 2014-04-13: qty 4

## 2014-04-13 NOTE — ED Notes (Signed)
Pt in xray. Xray asked to transport pt to B 19 when returning.

## 2014-04-13 NOTE — ED Notes (Signed)
Pt reports palpitations and L arm heaviness earlier in the week, states she had episode of same this morning. Denies chest pain at the time. Respirations unlabored. Pt is alert and oriented x4.

## 2014-04-13 NOTE — ED Notes (Signed)
Pt reports having onset of palpitations and left arm/shoulder heaviness this am around 1030. This has occurred twice in the past week. No acute distress noted at triage, ekg done and airway intact.

## 2014-04-13 NOTE — ED Provider Notes (Signed)
44 year old female with history of GERD, morbid obesity, asthma presents with atypical chest pain. Plan on assumption of my care is felt to troponin. EKG is nonischemic and chest x-ray is reassuring. This is felt to be noncardiac chest pain and will be referred back to PCP as well as referral to cardiology for palpitations. We will discharge to home if The delta troponin is negative.  5:20 PM delta troponin negative. Discharge to home. Cardiology referral given.  Dorna LeitzAlex Chandelle Harkey, MD 04/13/14 1719  Margarita Grizzleanielle Ray, MD 04/17/14 [id number]

## 2014-04-13 NOTE — ED Provider Notes (Signed)
CSN: [id number]     Arrival date & time 04/13/14  1248 History   First MD Initiated Contact with Patient 04/13/14 1415     Chief Complaint  Patient presents with  . Arm Pain  . Chest Pain     (Consider location/radiation/quality/duration/timing/severity/associated sxs/prior Treatment) HPI Comments: Patient presents today with a chief complaint of palpitations, chest pressure, and a heaviness in her left arm.  She reports that the symptoms have been occurring intermittently since 10 AM this morning.  She reports that the chest pressure only lasts a few minutes in the substernal area and then resolves without intervention.  She reports that the heaviness in her left arm has also been intermittent, but does not occur at the same time as the chest pressure.   She has not noticed an association with eating or exertion.  She denies any chest pain or pressure at this time.  She denies any numbness, tingling, cough, hemoptysis, nausea, vomiting, fever, chills, or SOB.  She denies any prior cardiac history.  She does have a history of HTN and is currently on Metoprolol for this.  She denies history of Hyperlipidemia or DM.  She is morbidly obese.  She does not smoke.  She does have a family history of MI.  She reports that her mother had a MI in her 49's and her father had a MI in his late 25's or early 32's.  No history of DVT or PE.  No prolonged travel or surgeries in the past 4 weeks.  No LE edema or pain.  No exogenous estrogen use.   The history is provided by the patient.    Past Medical History  Diagnosis Date  . Hypertension   . Bulging disc   . Asthma   . PONV (postoperative nausea and vomiting)     PT IS [place]  . Obesity   . Anemia    Past Surgical History  Procedure Laterality Date  . Cholecystectomy    . Gastric bypass    . Laparoscopic tubal ligation Bilateral 09/20/2013    Procedure: LAPAROSCOPIC TUBAL LIGATION;  Surgeon: Juluis Mire, MD;  Location: [facility];  Service:  Gynecology;  Laterality: Bilateral;  . Dilitation & currettage/hystroscopy with novasure ablation N/A 09/20/2013    Procedure: DILATATION & CURETTAGE/HYSTEROSCOPY WITH NOVASURE ABLATION;  Surgeon: Juluis Mire, MD;  Location: [facility];  Service: Gynecology;  Laterality: N/A;   Family History  Problem Relation Age of Onset  . Hypertension Sister    History  Substance Use Topics  . Smoking status: Never Smoker   . Smokeless tobacco: Not on file  . Alcohol Use: No   OB History    No data available     Review of Systems  All other systems reviewed and are negative.     Allergies  Review of patient's allergies indicates no known allergies.  Home Medications   Prior to Admission medications   Medication Sig Start Date End Date Taking? Authorizing Provider  albuterol (PROVENTIL HFA;VENTOLIN HFA) 108 (90 BASE) MCG/ACT inhaler Inhale 1 puff into the lungs every 6 (six) hours as needed for wheezing or shortness of breath.    Historical Provider, MD  ALPRAZolam Prudy Feeler) 0.25 MG tablet Take 1 tablet (0.25 mg total) by mouth daily as needed for anxiety. 03/05/14   Judie Bonus, MD  cetirizine (ZYRTEC) 10 MG tablet Take 10 mg by mouth daily.    Historical Provider, MD  ferrous sulfate 325 (65 FE) MG tablet Take  325 mg by mouth daily with breakfast.    Historical Provider, MD  fluticasone (FLONASE) 50 MCG/ACT nasal spray 2 sprays each nostril once a day 01/30/13   Historical Provider, MD  hyoscyamine (LEVSIN, ANASPAZ) 0.125 MG tablet Take 1 tablet (0.125 mg total) by mouth every 4 (four) hours as needed. 03/05/14   Judie BonusElizabeth A Kollar, MD  metoprolol (LOPRESSOR) 50 MG tablet Take 1 tablet (50 mg total) by mouth daily. 12/19/13   Collene GobbleSteven A Daub, MD  ranitidine (ZANTAC) 300 MG capsule Take 300 mg by mouth every evening.    Historical Provider, MD   BP 130/65 mmHg  Pulse 53  Temp(Src) 98.6 F (37 C)  Resp 18  SpO2 100% Physical Exam  Constitutional: She appears well-developed and  well-nourished.  HENT:  Head: Normocephalic and atraumatic.  Mouth/Throat: Oropharynx is clear and moist.  Neck: Normal range of motion. Neck supple.  Cardiovascular: Normal rate, regular rhythm and normal heart sounds.   Pulmonary/Chest: Effort normal and breath sounds normal.  Abdominal: Soft. Bowel sounds are normal. She exhibits no distension and no mass. There is no tenderness. There is no rebound and no guarding.  Morbidly obese abdomen  Musculoskeletal: Normal range of motion.  Neurological: She is alert.  Skin: Skin is warm and dry. She is not diaphoretic.  Psychiatric: She has a normal mood and affect.  Nursing note and vitals reviewed.   ED Course  Procedures (including critical care time) Labs Review Labs Reviewed  CBC  BASIC METABOLIC PANEL  I-STAT TROPOININ, ED    Imaging Review Dg Chest 2 View  04/13/2014   CLINICAL DATA:  Pt states she has had palpitations for the last week along with left arm pain. Pt has HTN and a hx of asthma.  EXAM: CHEST  2 VIEW  COMPARISON:  None.  FINDINGS: The heart size and mediastinal contours are within normal limits. Both lungs are clear. No pleural effusion or pneumothorax. The visualized skeletal structures are unremarkable.  IMPRESSION: No active cardiopulmonary disease.   Electronically Signed   By: Amie Portlandavid  Ormond M.D.   On: 04/13/2014 13:53     EKG Interpretation   Date/Time:  Saturday April 13 2014 12:54:53 EDT Ventricular Rate:  56 PR Interval:  152 QRS Duration: 96 QT Interval:  432 QTC Calculation: 416 R Axis:   49 Text Interpretation:  Sinus bradycardia Otherwise normal ECG normal no  change Confirmed by Donnald GarrePfeiffer, MD, Lebron ConnersMarcy [phone]([id number]) on 04/13/2014 3:11:53 PM      MDM   Final diagnoses:  None   Patient presents today with a chief complaint of palpations, chest pain, and heaviness in her left arm.  Chest pain is intermittent and atypical.  No association with exertion.  Pain only lasts for less than a minute at a time.   No ischemic changes on EKG.  Initial troponin is negative.  CXR is negative.  HEART score of 2 putting her at low risk.  Delta troponin is pending.  Dr. Durward FortesNickle will follow up on the results.  Plan is for the patient to be discharged home if Delta Trop is negative.  Patient discussed with Dr. Donnald GarrePfeiffer who is in agreement with the plan.    Santiago GladHeather Cap Massi, PA-C 04/14/14 2116  Arby BarretteMarcy Pfeiffer, MD 04/17/14 2219

## 2014-04-13 NOTE — Discharge Instructions (Signed)

## 2014-04-22 ENCOUNTER — Ambulatory Visit: Payer: 59 | Admitting: Family Medicine

## 2014-04-25 ENCOUNTER — Encounter: Payer: Self-pay | Admitting: Physical Therapy

## 2014-04-25 ENCOUNTER — Ambulatory Visit: Payer: 59 | Attending: Family Medicine | Admitting: Physical Therapy

## 2014-04-25 DIAGNOSIS — R262 Difficulty in walking, not elsewhere classified: Secondary | ICD-10-CM | POA: Insufficient documentation

## 2014-04-25 DIAGNOSIS — M25562 Pain in left knee: Secondary | ICD-10-CM | POA: Diagnosis present

## 2014-04-25 NOTE — Therapy (Signed)
[facility] [address] [place], [place], [address] Phone: [phone]   Fax:  [phone]  Physical Therapy Evaluation  Patient Details  Name: [name] MRN: [id number] Date of Birth: 1970-12-05 Referring Provider:  Judi SaaSmith, Zachary M, DO  Encounter Date: 04/25/2014      PT End of Session - 04/25/14 1013    Visit Number 1   PT Start Time [id number]   PT Stop Time 1015   PT Time Calculation (min) 53 min   Activity Tolerance Patient tolerated treatment well   Behavior During Therapy [facility] for tasks assessed/performed      Past Medical History  Diagnosis Date  . Hypertension   . Bulging disc   . Asthma   . PONV (postoperative nausea and vomiting)     PT IS [place]  . Obesity   . Anemia     Past Surgical History  Procedure Laterality Date  . Cholecystectomy    . Gastric bypass    . Laparoscopic tubal ligation Bilateral 09/20/2013    Procedure: LAPAROSCOPIC TUBAL LIGATION;  Surgeon: Juluis MireJohn S McComb, MD;  Location: [facility];  Service: Gynecology;  Laterality: Bilateral;  . Dilitation & currettage/hystroscopy with novasure ablation N/A 09/20/2013    Procedure: DILATATION & CURETTAGE/HYSTEROSCOPY WITH NOVASURE ABLATION;  Surgeon: Juluis MireJohn S McComb, MD;  Location: [facility];  Service: Gynecology;  Laterality: N/A;    There were no vitals filed for this visit.  Visit Diagnosis:  Left knee pain - Plan: PT plan of care cert/re-cert  Difficulty walking - Plan: PT plan of care cert/re-cert      Subjective Assessment - 04/25/14 0933    Symptoms Patient reports that she fell in July 2015, she hurt her left knee, she did not seek medical attention and hoped it would get better, she reports that the pain continued.   Pertinent History none   Limitations Standing;Walking;House hold activities   How long can you stand comfortably? 30   How long can you walk comfortably? 30   Diagnostic tests x-rays negative   Patient  Stated Goals walk, get on floor and have no pain   Currently in Pain? Yes   Pain Score 4    Pain Location Knee   Pain Orientation Left   Pain Descriptors / Indicators Aching;Dull   Pain Type Chronic pain   Pain Onset More than a month ago   Pain Frequency Constant   Aggravating Factors  when getting on floor or trying to get up from floor pain up to 9/10   Pain Relieving Factors rest   Effect of Pain on Daily Activities limits her work and ability to walk            [facility] PT Assessment - 04/25/14 0001    Assessment   Medical Diagnosis left knee pain    Onset Date 08/24/13   Prior Therapy none   Precautions   Precautions None   Restrictions   Weight Bearing Restrictions No   Balance Screen   Has the patient fallen in the past 6 months No   Has the patient had a decrease in activity level because of a fear of falling?  No   Is the patient reluctant to leave their home because of a fear of falling?  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   Leisure none   ROM /  Strength   AROM / PROM / Strength --  AROM of the left knee 0-105 degrees with pain   Palpation   Palpation tender over patella and lateral joint line, has lateral tracking patella   Ambulation/Gait   Gait Comments valgus at knees, antalgic on the left                   [facility] Adult PT Treatment/Exercise - 04/25/14 0001    Ultrasound   Ultrasound Location left anterior knee   Ultrasound Parameters 100%   Ultrasound Goals Pain   Iontophoresis   Type of Iontophoresis Dexamethasone   Location left anterior knee   Dose 80mA                PT Education - 04/25/14 1012    Education provided Yes   Education Details VMO exercises   Person(s) Educated Patient   Methods Explanation;Demonstration;Handout   Comprehension Verbalized understanding          PT Short Term Goals - 04/25/14 1016    PT SHORT TERM  GOAL #1   Title independent with intial HEP   Time 2   Period Weeks   Status New           PT Long Term Goals - 04/25/14 1016    PT LONG TERM GOAL #1   Title decrease pain 50%   Time 8   Period Weeks   Status New   PT LONG TERM GOAL #2   Title get on and off floor without pain >5/10   Time 8   Period Weeks   Status New   PT LONG TERM GOAL #3   Title walk without significant deviation   Time 8   Period Weeks   Status New   PT LONG TERM GOAL #4   Title sit without any pain   Time 8   Period Weeks   Status New               Plan - 04/25/14 1014    Clinical Impression Statement Patient with left knee pain after a fall in [place], seems to have inflammation around the patella, lateral tracking patella, tender over the patellar tendon   Pt will benefit from skilled therapeutic intervention in order to improve on the following deficits Abnormal gait;Decreased range of motion;Decreased strength;Difficulty walking;Pain;Impaired flexibility   Rehab Potential Good   PT Frequency 2x / week   PT Duration 8 weeks   PT Treatment/Interventions Ultrasound;Moist Heat;Electrical Stimulation;Gait training;Functional mobility training;Therapeutic activities;Balance training;Therapeutic exercise;Patient/family education;Manual techniques   PT Next Visit Plan see how ionto did, add gym exercises   Consulted and Agree with Plan of Care Patient         Problem List Patient Active Problem List   Diagnosis Date Noted  . GERD (gastroesophageal reflux disease) 03/05/2014  . Morbid obesity 03/05/2014  . Allergic rhinitis 03/05/2014  . Left knee pain 03/05/2014  . Essential hypertension 03/05/2014  . Asthma, chronic 03/05/2014  . Hx of gastric bypass 03/05/2014    Jearld Lesch, PT 04/25/2014, 10:19 AM  [facility] [address] [place], [place], [address] Phone: [phone]   Fax:  [phone]

## 2014-04-29 ENCOUNTER — Encounter: Payer: Self-pay | Admitting: Physical Therapy

## 2014-04-29 ENCOUNTER — Ambulatory Visit: Payer: 59 | Attending: Family Medicine | Admitting: Physical Therapy

## 2014-04-29 DIAGNOSIS — M25562 Pain in left knee: Secondary | ICD-10-CM | POA: Diagnosis not present

## 2014-04-29 DIAGNOSIS — R262 Difficulty in walking, not elsewhere classified: Secondary | ICD-10-CM | POA: Diagnosis not present

## 2014-04-29 NOTE — Therapy (Addendum)
[place] [address] [place], [place], [address] Phone: [phone]   Fax:  [phone]  Physical Therapy Treatment  Patient Details  Name: [name] MRN: [id number] Date of Birth: [birth year] Referring Provider:  Lyndal Pulley, DO  Encounter Date: 04/29/2014      PT End of Session - 04/29/14 1013    Visit Number 2   PT Start Time 0932   PT Stop Time 8421   PT Time Calculation (min) 43 min   Activity Tolerance Patient tolerated treatment well   Behavior During Therapy [facility] for tasks assessed/performed      Past Medical History  Diagnosis Date  . Hypertension   . Bulging disc   . Asthma   . PONV (postoperative nausea and vomiting)     PT IS [place]  . Obesity   . Anemia     Past Surgical History  Procedure Laterality Date  . Cholecystectomy    . Gastric bypass    . Laparoscopic tubal ligation Bilateral 09/20/2013    Procedure: LAPAROSCOPIC TUBAL LIGATION;  Surgeon: Darlyn Chamber, MD;  Location: [place] [facility];  Service: Gynecology;  Laterality: Bilateral;  . Dilitation & currettage/hystroscopy with novasure ablation N/A 09/20/2013    Procedure: DILATATION & CURETTAGE/HYSTEROSCOPY WITH NOVASURE ABLATION;  Surgeon: Darlyn Chamber, MD;  Location: [place] [place] [facility];  Service: Gynecology;  Laterality: N/A;    There were no vitals filed for this visit.  Visit Diagnosis:  Left knee pain  Difficulty walking      Subjective Assessment - 04/29/14 0932    Subjective I'm doing ok.   Currently in Pain? Yes   Pain Score 1                        [facility] Adult PT Treatment/Exercise - 04/29/14 0001    Exercises   Exercises Knee/Hip   Knee/Hip Exercises: Aerobic   Stationary Bike 4 minutes   Elliptical 6 minutes  Nustep level 5   Modalities   Modalities Iontophoresis;Ultrasound   Ultrasound   Ultrasound Location left medial knee   Ultrasound Parameters 1.80mz, 1.2 w/cm2   Ultrasound Goals  Pain;Other (Comment)  inflammation   Iontophoresis   Type of Iontophoresis Dexamethasone   Location left anterior knee   Dose 834m  Time 4 hours   Knee/Hip Exercises: Machines for Strengthening   Cybex Knee Extension 10# 2x15   Cybex Knee Flexion 25# 2x15   Cybex Leg Press 20# VMO 2x15                  PT Short Term Goals - 04/29/14 1015    PT SHORT TERM GOAL #1   Title independent with intial HEP   Time 2   Period Weeks   Status Achieved           PT Long Term Goals - 04/29/14 1014    PT LONG TERM GOAL #1   Title decrease pain 50%   Time 8   Period Weeks   Status Achieved   PT LONG TERM GOAL #2   Title get on and off floor without pain >5/10   Time 8   Period Weeks   Status On-going   PT LONG TERM GOAL #3   Title walk without significant deviation   Time 8   Period Weeks   Status On-going   PT LONG TERM GOAL #4   Title sit without any pain  Time 8   Period Weeks   Status On-going               Problem List Patient Active Problem List   Diagnosis Date Noted  . GERD (gastroesophageal reflux disease) 03/05/2014  . Morbid obesity 03/05/2014  . Allergic rhinitis 03/05/2014  . Left knee pain 03/05/2014  . Essential hypertension 03/05/2014  . Asthma, chronic 03/05/2014  . Hx of gastric bypass 03/05/2014   PHYSICAL THERAPY DISCHARGE SUMMARY   Plan: Patient agrees to discharge.  Patient goals were partially met. Patient is being discharged due to meeting the stated rehab goals.  ?????      Luisa Louk PTA 04/29/2014, 10:15 AM  [place], [place], [address] Phone: [phone]   Fax:  [phone]

## 2014-05-01 ENCOUNTER — Ambulatory Visit (INDEPENDENT_AMBULATORY_CARE_PROVIDER_SITE_OTHER): Payer: 59 | Admitting: Family Medicine

## 2014-05-01 ENCOUNTER — Encounter: Payer: Self-pay | Admitting: *Deleted

## 2014-05-01 ENCOUNTER — Encounter: Payer: Self-pay | Admitting: Family Medicine

## 2014-05-01 VITALS — BP 128/84 | HR 56 | Ht 65.0 in | Wt 335.0 lb

## 2014-05-01 DIAGNOSIS — M25562 Pain in left knee: Secondary | ICD-10-CM | POA: Diagnosis not present

## 2014-05-01 NOTE — Progress Notes (Signed)
  Tawana ScaleZach Smith D.O. [facility] [address] [place], [place] [address] Phone: [phone](336) [phone] Subjective:    I'm seeing this patient by the request  of:  Judie BonusKollar, Elizabeth A, MD   CC: Left knee pain follow-up  BJY:NWGNFAOZHYHPI:Subjective Amy BayleyMelissa I Duffy Townsend is a 44 y.o. female coming in with complaint of left knee pain. Patient was seen previously and had more of a patellofemoral syndrome. Patient had x-rays previously that did not show any significant bony abnormality. Patient also has some laxity of her PCL. Patient elected to try an injection after last exam. Patient states she is only approximate 5-10% better. Patient has started going to formal physical therapy but does have some difficulty. Patient has gone 2-3 times with mild improvement. Patient states that she has been very sore after the physical therapy. Patient is trying to do the home exercises regularly. Patient continues the natural supplementation. Patient unfortunately does give history of the knee now giving her times where it feels like it is going to buckle. States that this is more frequent than it was previously. Patient has not fallen but states that there is times when she feels like she is about 2.    Past medical history, social, surgical and family history all reviewed in electronic medical record.   Review of Systems: No headache, visual changes, nausea, vomiting, diarrhea, constipation, dizziness, abdominal pain, skin rash, fevers, chills, night sweats, weight loss, swollen lymph nodes, body aches, joint swelling, muscle aches, chest pain, shortness of breath, mood changes.   Objective Blood pressure 128/84, pulse 56, height 5\' 5"  (1.651 m), weight 335 lb (151.955 kg), SpO2 99 %.  General: No apparent distress alert and oriented x3 mood and affect normal, dressed appropriately. Severly obese.  HEENT: Pupils equal, extraocular movements intact  Respiratory: Patient's speak in full sentences and does not appear short of  breath  Cardiovascular: No lower extremity edema, non tender, no erythema  Skin: Warm dry intact with no signs of infection or rash on extremities or on axial skeleton.  Abdomen: Soft nontender  Neuro: Cranial nerves II through XII are intact, neurovascularly intact in all extremities with 2+ DTRs and 2+ pulses.  Lymph: No lymphadenopathy of posterior or anterior cervical chain or axillae bilaterally.  Gait normal with good balance and coordination.  MSK:  Non tender with full range of motion and good stability and symmetric strength and tone of shoulders, elbows, wrist, hip, and ankles bilaterally.  Knee: Right Inspection is difficult to assess secondary to patient's body habitus Continued tenderness over the patella to palpation ROM full in flexion and extension and lower leg rotation. She neck she has extreme extension of the knees bilaterally left than right.  Ligaments with solid consistent endpoints including ACL,  LCL, MCL. PCL laxity compared to the contralateral side but endpoint still noted. Negative Mcmurray's, Apley's, and Thessalonian tests. painful patellar compression with J tracking. Mild improvement in VMO strength Patellar glide with more crepitus than previously.  Patellar and quadriceps tendons unremarkable. Hamstring and quadriceps strength is normal.      Impression and Recommendations:     This case required medical decision making of moderate complexity.

## 2014-05-01 NOTE — Patient Instructions (Signed)
Good to see you Ice 20 minutes 2 times daily. Usually after activity and before bed. Ibuprofen only when needed.  Tylenol  650 mg 3 times a day can help We will get MRI to see what is going on Come back 1-2 days afterward and we will discuss Hold on PT and we will discuss after MRI.

## 2014-05-01 NOTE — Assessment & Plan Note (Signed)
Patient's left knee pain I do still think is secondary to the patellofemoral syndrome. I do think though that we do need to evaluate further with patient not making significant improvement with conservative therapy and increased laxity noted. Patient has had different times when he felt like her knee was going to buckle. I do feel advance imaging is warranted at this time. Patient can continue conservative therapy and physical therapy but I like her to stop on physical therapy until MRI is done. Patient will come back in 1-2 days after the MRI and we'll discuss results. Due to patient's body habitus I do not think bracing is warranted at this time the patient may need a custom brace depending on findings. Patient does have laxity of the PCL so there is a concern that patient may have some ligamentous injury. I do think chondromalacia's high on the differential as well. We will see with advanced imaging.  Spent  25 minutes with patient face-to-face and had greater than 50% of counseling including as described above in assessment and plan.

## 2014-05-01 NOTE — Progress Notes (Signed)
Pre visit review using our clinic review tool, if applicable. No additional management support is needed unless otherwise documented below in the visit note. 

## 2014-05-23 ENCOUNTER — Ambulatory Visit
Admission: RE | Admit: 2014-05-23 | Discharge: 2014-05-23 | Disposition: A | Discharge status: Home / Self Care | Payer: 59 | Source: Ambulatory Visit | Attending: Family Medicine | Admitting: Family Medicine

## 2014-05-23 DIAGNOSIS — M25562 Pain in left knee: Secondary | ICD-10-CM

## 2014-05-28 ENCOUNTER — Ambulatory Visit (INDEPENDENT_AMBULATORY_CARE_PROVIDER_SITE_OTHER): Payer: 59 | Admitting: Family Medicine

## 2014-05-28 ENCOUNTER — Encounter: Payer: Self-pay | Admitting: Family Medicine

## 2014-05-28 VITALS — BP 124/82 | HR 56 | Ht 65.0 in | Wt 335.0 lb

## 2014-05-28 DIAGNOSIS — M2242 Chondromalacia patellae, left knee: Secondary | ICD-10-CM | POA: Diagnosis not present

## 2014-05-28 NOTE — Progress Notes (Signed)
  Tawana ScaleZach Printice Hellmer D.O. [facility] [address] [place], [place] [address] Phone: [phone]) [phone] Subjective:    I'm seeing this patient by the request  of:  Judie BonusKollar, Elizabeth A, MD   CC: Left knee pain follow-up  BJY:NWGNFAOZHYHPI:Subjective [place] BayleyMelissa I Duffy [name] is a 44 y.o. female coming in with complaint of left knee pain. Patient was seen previously and had more of a patellofemoral syndrome. Patient had x-rays previously that did not show any significant bony abnormality. Patient also has some laxity of her PCL. She continued to have pain and did respond only minorly to a steroid injection. Patient did have an MRI of the knee and is here to go over results. Patient's MRI of her left knee showed the patient does have partial thickness loss of the patellofemoral joint but otherwise fairly unremarkable.patient actually states overall she is been doing relatively well. Continues to do the exercises regularly and is not having as much instability. Patient continues to use anti-inflammatories as well as Tylenol on a fairly regular basis.      Past medical history, social, surgical and family history all reviewed in electronic medical record.   Review of Systems: No headache, visual changes, nausea, vomiting, diarrhea, constipation, dizziness, abdominal pain, skin rash, fevers, chills, night sweats, weight loss, swollen lymph nodes, body aches, joint swelling, muscle aches, chest pain, shortness of breath, mood changes.   Objective Blood pressure 124/82, pulse 56, height 5\' 5"  (1.651 m), weight 335 lb (151.955 kg), SpO2 98 %.  General: No apparent distress alert and oriented x3 mood and affect normal, dressed appropriately. Severly obese.  HEENT: Pupils equal, extraocular movements intact  Respiratory: Patient's speak in full sentences and does not appear short of breath  Cardiovascular: No lower extremity edema, non tender, no erythema  Skin: Warm dry intact with no signs of infection or rash on  extremities or on axial skeleton.  Abdomen: Soft nontender  Neuro: Cranial nerves II through XII are intact, neurovascularly intact in all extremities with 2+ DTRs and 2+ pulses.  Lymph: No lymphadenopathy of posterior or anterior cervical chain or axillae bilaterally.  Gait normal with good balance and coordination.  MSK:  Non tender with full range of motion and good stability and symmetric strength and tone of shoulders, elbows, wrist, hip, and ankles bilaterally.  Knee: Right Inspection is difficult to assess secondary to patient's body habitus Continued tenderness over the patella to palpation ROM full in flexion and extension and lower leg rotation. She neck she has extreme extension of the knees bilaterally left than right.  Ligaments with solid consistent endpoints including ACL,  LCL, MCL. PCL laxity compared to the contralateral side but endpoint still noted. Negative Mcmurray's, Apley's, and Thessalonian tests. painful patellar compression with J tracking. Mild improvement in VMO strength still compared to previous exam. Patellar glide with more crepitus than previously.  Patellar and quadriceps tendons unremarkable. Hamstring and quadriceps strength is normal.  Mild improvement from previous exam Contralateral exam unremarkable    Impression and Recommendations:     This case required medical decision making of moderate complexity.

## 2014-05-28 NOTE — Assessment & Plan Note (Signed)
Patient is doing somewhat better with conservative therapy. Patient though has failed some formal physical therapy on 2 different rounds as well as an icing protocol. Patient states though that she will continue with the home exercises. We discussed the possibility of viscous supplementation and patient will call if she wants to change her mind. Patient does well for about 2 months and we can always repeat steroid injection. Encourage patient to continue to wear good shoes as well as lose weight. Patient will come back and see me on an as-needed basis.  Spent  25 minutes with patient face-to-face and had greater than 50% of counseling including as described above in assessment and plan.

## 2014-05-28 NOTE — Progress Notes (Signed)
Pre visit review using our clinic review tool, if applicable. No additional management support is needed unless otherwise documented below in the visit note. 

## 2014-05-28 NOTE — Patient Instructions (Signed)
Good to see you Arthritis of the knee cap.  Ice after activity continue all the medicines Call me if you change mind on the injections See me when you need me and can repeat steroid in 2 months.

## 2014-06-13 ENCOUNTER — Encounter: Payer: Self-pay | Admitting: Internal Medicine

## 2014-06-14 MED ORDER — MELOXICAM 15 MG PO TABS: 15.0000 mg | ORAL_TABLET | Freq: Every day | ORAL | Status: DC

## 2014-06-19 ENCOUNTER — Other Ambulatory Visit: Payer: Self-pay | Admitting: Emergency Medicine

## 2014-06-22 ENCOUNTER — Other Ambulatory Visit: Payer: Self-pay | Admitting: Internal Medicine

## 2014-07-12 ENCOUNTER — Encounter: Payer: Self-pay | Admitting: Internal Medicine

## 2014-07-12 MED ORDER — MELOXICAM 15 MG PO TABS: 15.0000 mg | ORAL_TABLET | Freq: Every day | ORAL | Status: DC

## 2014-08-15 ENCOUNTER — Encounter: Payer: Self-pay | Admitting: Internal Medicine

## 2014-09-04 ENCOUNTER — Ambulatory Visit: Payer: 59 | Admitting: Internal Medicine

## 2014-09-25 ENCOUNTER — Ambulatory Visit (INDEPENDENT_AMBULATORY_CARE_PROVIDER_SITE_OTHER): Payer: 59 | Admitting: Internal Medicine

## 2014-09-25 ENCOUNTER — Encounter: Payer: Self-pay | Admitting: Internal Medicine

## 2014-09-25 VITALS — BP 112/88 | HR 64 | Temp 98.3°F | Resp 12 | Ht 66.0 in | Wt 339.8 lb

## 2014-09-25 DIAGNOSIS — M25562 Pain in left knee: Secondary | ICD-10-CM

## 2014-09-25 DIAGNOSIS — E538 Deficiency of other specified B group vitamins: Secondary | ICD-10-CM

## 2014-09-25 DIAGNOSIS — Z23 Encounter for immunization: Secondary | ICD-10-CM | POA: Diagnosis not present

## 2014-09-25 DIAGNOSIS — I872 Venous insufficiency (chronic) (peripheral): Secondary | ICD-10-CM | POA: Diagnosis not present

## 2014-09-25 MED ORDER — CYANOCOBALAMIN 1000 MCG/ML IJ SOLN
1000.0000 ug | Freq: Once | INTRAMUSCULAR | Status: AC
  Administered 2014-09-25: 1000 ug via INTRAMUSCULAR

## 2014-09-25 MED ORDER — CELECOXIB 200 MG PO CAPS: 200.0000 mg | ORAL_CAPSULE | Freq: Two times a day (BID) | ORAL | Status: DC

## 2014-09-25 MED ORDER — TRAMADOL HCL 50 MG PO TABS: 50.0000 mg | ORAL_TABLET | Freq: Every evening | ORAL | Status: DC | PRN

## 2014-09-25 MED ORDER — CYANOCOBALAMIN 1000 MCG/ML IJ SOLN: 1000.0000 ug | INTRAMUSCULAR | Status: DC

## 2014-09-25 NOTE — Assessment & Plan Note (Signed)
Will change meloxicam to celebrex so that she can take a second pill around lunchtime for better control of daytime symptoms. Rx for tramadol also provided for night time only. Likely will need surgery soon and talked to her again about the fact that her weight is causing this to be so advanced at so young an age.

## 2014-09-25 NOTE — Progress Notes (Signed)
   Subjective:    Patient ID: [name], female    DOB: 07/18/1970, 44 y.o.   MRN: [id number]  HPI The patient is coming in for follow up of her B12 deficiency (severe and advised oral high dose replacement at last visit, related to her previous gastric bypass surgery). She has not been taking that medication regularly. Has not changed her diet. Denies numbness or weakness in her hands or feet. No memory problems. Next problem is her left knee pain. Went to see orthopedics and got MRI since last visit and no tendon problems. Likely severe arthritis secondary to her morbid obesity. She has not had any weight loss since that time. Still using the meloxicam but has pain most days. Especially at night time it is hard for her to sleep. She wakes up with pain and has a difficult time getting back to sleep. Pain 8-9/10 at night. During the day she is able to distract herself from the pain.   Review of Systems  Constitutional: Positive for activity change. Negative for fever, appetite change, fatigue and unexpected weight change.  HENT: Negative for congestion, postnasal drip, rhinorrhea and sinus pressure.   Respiratory: Negative for cough, chest tightness, shortness of breath and wheezing.   Cardiovascular: Negative for chest pain, palpitations and leg swelling.  Gastrointestinal: Negative for nausea, abdominal pain, diarrhea, constipation and abdominal distention.  Musculoskeletal: Positive for arthralgias and gait problem. Negative for myalgias and back pain.  Skin: Negative.   Neurological: Negative.   Psychiatric/Behavioral: Negative.       Objective:   Physical Exam  Constitutional: She is oriented to person, place, and time. She appears well-developed and well-nourished.  Obese  HENT:  Head: Normocephalic and atraumatic.  Right Ear: External ear normal.  Left Ear: External ear normal.  Eyes: EOM are normal.  Neck: Normal range of motion.  Cardiovascular: Normal rate and regular  rhythm.   Pulmonary/Chest: Effort normal and breath sounds normal. No respiratory distress. She has no wheezes. She has no rales.  Abdominal: Soft. Bowel sounds are normal. She exhibits no distension.  Musculoskeletal: She exhibits tenderness.  Neurological: She is alert and oriented to person, place, and time.  Slow to stand, uses arms to push up.  Skin: Skin is warm and dry.  Psychiatric: She has a normal mood and affect. Her behavior is normal.   Filed Vitals:   09/25/14 0810  BP: 112/88  Pulse: 64  Temp: 98.3 F (36.8 C)  TempSrc: Oral  Resp: 12  Height:  (1.676 m)  Weight: 339 lb 12.8 oz (154.132 kg)  SpO2: 99%      Assessment & Plan:

## 2014-09-25 NOTE — Patient Instructions (Signed)
We have sent in the B12 for the shots and if you need training bring the first one to the office for training on how to do it.   We have given you a flu shot today as well as a B12 shot. It is important to keep up with the B12 since due to your past surgery you are not as able to get B12 from diet which is why you need either the high dose pills or the shots to help out.  We are going to check the B12 level after you have been on the shots at the next visit.   Vitamin B12 Deficiency Not having enough vitamin B12 is called a deficiency. Vitamin B12 is an important vitamin. Your body needs vitamin B12 to:   Make red blood cells.  Make DNA. This is the genetic material inside all of your cells.  Help your nerves work properly so they can carry messages from your brain to your body. CAUSES  Not eating enough foods that contain vitamin B12.  Not having enough stomach acid and digestive juices. The body needs these to absorb vitamin B12 from the food you eat.  Having certain digestive system diseases that make it hard to absorb vitamin B12. These diseases include Crohn's disease, chronic pancreatitis, and cystic fibrosis.  Having pernicious anemia, which is a condition where the body has too few red blood cells. People with this condition do not make enough of a protein called "intrinsic factor," which is needed to absorb vitamin B12.  Having a surgery in which part of the stomach or small intestine is removed.  Taking certain medicines that make it hard for the body to absorb vitamin B12. These medicines include:  Heartburn medicine (antacids and proton pump inhibitors).  A certain antibiotic medicine called neomycin, which fights infection.  Some medicines used to treat diabetes, tuberculosis, gout, and high cholesterol. RISK FACTORS Risk factors are things that make you more likely to develop a vitamin B12 deficiency. They include:  Being older than 50.  Being a  vegetarian.  Being pregnant and a vegetarian or having a poor diet.  Taking certain drugs.  Being an alcoholic. SYMPTOMS You may have a vitamin B12 deficiency with no symptoms. However, a vitamin B12 deficiency can cause health problems like anemia and nerve damage. These health problems can lead to many possible symptoms, including:  Weakness.  Fatigue.  Loss of appetite.  Weight loss.  Numbness or tingling in your hands and feet.  Redness and burning of the tongue.  Confusion or memory problems.  Depression.  Dizziness.  Sensory problems, such as loss of taste, color blindness, and ringing in the ears.  Diarrhea or constipation.  Trouble walking. DIAGNOSIS Various types of tests can be given to help find the cause of your vitamin B12 deficiency. These tests include:  A complete blood count (CBC). This test gives your caregiver an overall picture of what makes up your blood.  A blood test to measure your B12 level.  A blood test to measure intrinsic factor.  An endoscopy. This procedure uses a thin tube with a camera on the end to look into your stomach or intestines. TREATMENT Treatment for vitamin B12 deficiency depends on what is causing it. Common options include:  Changing your eating and drinking habits, such as:  Eating more foods that contain vitamin B12.  Not drinking as much alcohol or any alcohol.  Taking vitamin B12 supplements. Your caregiver will tell you what dose is best  for you.  Getting vitamin B12 injections. Some people get these a few times a week. Others get them once a month. HOME CARE INSTRUCTIONS  Take all supplements as directed by your caregiver. Follow the directions carefully.  Get any injections your caregiver prescribes. Do not miss your appointments.  Eat lots of healthy foods that contain vitamin B12. Ask your caregiver if you should work with a nutritionist. Good things to include in your diet  are:  Meat.  Poultry.  Fish.  Eggs.  Fortified cereal and dairy products. This means vitamin B12 has been added to the food. Check the label on the package to be sure.  Do not abuse alcohol.  Keep all follow-up appointments. Your caregiver will need to perform blood tests to make sure your vitamin B12 deficiency is going away. SEEK MEDICAL CARE IF:  You have any questions about your treatment.  Your symptoms come back. MAKE SURE YOU:  Understand these instructions.  Will watch your condition.  Will get help right away if you are not doing well or get worse. Document Released: 04/05/2011 Document Reviewed: 04/05/2011 [facility] Patient Information 2015 [place], [place]. This information is not intended to replace advice given to you by your health care provider. Make sure you discuss any questions you have with your health care provider.

## 2014-09-25 NOTE — Assessment & Plan Note (Signed)
B12 level is severely low and talked to her today about the importance of having normal B12 level for nerve health and the consequences of low B12 over time. She does not feel able to take it everyday. Given B12 injection today and will start doing injections at home for replacement. Feels more able to commit to that. Rx sent in today. Advised that with her surgical history she may need these the rest of her days since she is not able to keep up with vitamin supplements daily.

## 2014-09-25 NOTE — Progress Notes (Signed)
Pre visit review using our clinic review tool, if applicable. No additional management support is needed unless otherwise documented below in the visit note. 

## 2014-09-27 ENCOUNTER — Encounter: Payer: Self-pay | Admitting: Internal Medicine

## 2014-10-18 ENCOUNTER — Other Ambulatory Visit: Payer: Self-pay | Admitting: Obstetrics and Gynecology

## 2014-10-21 LAB — CYTOLOGY - PAP

## 2014-10-23 ENCOUNTER — Other Ambulatory Visit: Payer: Self-pay | Admitting: Internal Medicine

## 2014-11-04 ENCOUNTER — Telehealth: Payer: Self-pay | Admitting: Nurse Practitioner

## 2014-11-04 DIAGNOSIS — J0101 Acute recurrent maxillary sinusitis: Secondary | ICD-10-CM

## 2014-11-04 MED ORDER — AMOXICILLIN-POT CLAVULANATE 875-125 MG PO TABS: 1.0000 | ORAL_TABLET | Freq: Two times a day (BID) | ORAL | Status: DC

## 2014-11-04 MED ORDER — FLUCONAZOLE 150 MG PO TABS
150.0000 mg | ORAL_TABLET | Freq: Once | ORAL | Status: DC
Start: 2014-11-04 — End: 2014-12-24

## 2014-11-04 NOTE — Progress Notes (Signed)

## 2014-11-13 ENCOUNTER — Encounter: Payer: Self-pay | Admitting: Internal Medicine

## 2014-11-14 MED ORDER — DICLOFENAC EPOLAMINE 1.3 % TD PTCH: 1.0000 | MEDICATED_PATCH | Freq: Two times a day (BID) | TRANSDERMAL | Status: DC

## 2014-11-28 ENCOUNTER — Telehealth: Payer: Self-pay

## 2014-11-28 NOTE — Telephone Encounter (Signed)
PA started via cover my meds.  

## 2014-11-28 NOTE — Telephone Encounter (Signed)
Received PA request via cover my meds.   Message to Lillia AbedLindsay to confirm if this still needs to be done (Dr. Katrinka BlazingSmith rxed).   Will complete if it has not been completed yet.

## 2014-11-28 NOTE — Telephone Encounter (Signed)
Per dr Katrinka Blazingsmith, he did not prescribe this medicine. Dr. Okey Duprerawford prescribed this med 10.20.16

## 2014-12-03 ENCOUNTER — Telehealth: Payer: Self-pay

## 2014-12-03 NOTE — Telephone Encounter (Signed)
PA initiated via covermymeds. ZOX:[id number]

## 2014-12-23 ENCOUNTER — Encounter: Payer: Self-pay | Admitting: Internal Medicine

## 2014-12-24 MED ORDER — FLUCONAZOLE 150 MG PO TABS
150.0000 mg | ORAL_TABLET | Freq: Once | ORAL | Status: DC
Start: 2014-12-24 — End: 2015-08-26

## 2014-12-24 MED ORDER — AMOXICILLIN-POT CLAVULANATE 875-125 MG PO TABS: 1.0000 | ORAL_TABLET | Freq: Two times a day (BID) | ORAL | Status: DC

## 2014-12-24 NOTE — Addendum Note (Signed)
Addended by: Hillard DankerRAWFORD, ELIZABETH A on: 12/24/2014 12:16 PM   Modules accepted: Orders

## 2014-12-27 ENCOUNTER — Other Ambulatory Visit: Payer: Self-pay | Admitting: Internal Medicine

## 2015-03-02 ENCOUNTER — Telehealth: Payer: Self-pay | Admitting: Family

## 2015-03-02 DIAGNOSIS — B3731 Acute candidiasis of vulva and vagina: Secondary | ICD-10-CM

## 2015-03-02 DIAGNOSIS — B9689 Other specified bacterial agents as the cause of diseases classified elsewhere: Secondary | ICD-10-CM

## 2015-03-02 DIAGNOSIS — B373 Candidiasis of vulva and vagina: Secondary | ICD-10-CM

## 2015-03-02 DIAGNOSIS — J069 Acute upper respiratory infection, unspecified: Secondary | ICD-10-CM

## 2015-03-02 MED ORDER — LEVOFLOXACIN 500 MG PO TABS: 500.0000 mg | ORAL_TABLET | Freq: Every day | ORAL | Status: DC

## 2015-03-02 MED ORDER — FLUCONAZOLE 150 MG PO TABS: 150.0000 mg | ORAL_TABLET | Freq: Once | ORAL | Status: DC

## 2015-03-02 NOTE — Progress Notes (Signed)
We are sorry that you are not feeling well.  Here is how we plan to help!  Based on what you have shared with me it looks like you have sinusitis.  Sinusitis is inflammation and infection in the sinus cavities of the head.  Based on your presentation I believe you most likely have Acute Bacterial Sinusitis.  This is an infection caused by bacteria and is treated with antibiotics. I have prescribed Levofloxicin  by mouth once daily for 7 days. You may use an oral decongestant such as Mucinex D or if you have glaucoma or high blood pressure use plain Mucinex. Saline nasal spray help and can safely be used as often as needed for congestion.  If you develop worsening sinus pain, fever or notice severe headache and vision changes, or if symptoms are not better after completion of antibiotic, please schedule an appointment with a health care provider.    I have also prescribed Diflucan 150 mg for one dose as requested for the possible yeast infection.  Sinus infections are not as easily transmitted as other respiratory infection, however we still recommend that you avoid close contact with loved ones, especially the very young and elderly.  Remember to wash your hands thoroughly throughout the day as this is the number one way to prevent the spread of infection!  Home Care:  Only take medications as instructed by your medical team.  Complete the entire course of an antibiotic.  Do not take these medications with alcohol.  A steam or ultrasonic humidifier can help congestion.  You can place a towel over your head and breathe in the steam from hot water coming from a faucet.  Avoid close contacts especially the very young and the elderly.  Cover your mouth when you cough or sneeze.  Always remember to wash your hands.  Get Help Right Away If:  You develop worsening fever or sinus pain.  You develop a severe head ache or visual changes.  Your symptoms persist after you have completed your  treatment plan.  Make sure you  Understand these instructions.  Will watch your condition.  Will get help right away if you are not doing well or get worse.  Your e-visit answers were reviewed by a board certified advanced clinical practitioner to complete your personal care plan.  Depending on the condition, your plan could have included both over the counter or prescription medications.  If there is a problem please reply  once you have received a response from your provider.  Your safety is important to [place].  If you have drug allergies check your prescription carefully.    You can use MyChart to ask questions about today's visit, request a non-urgent call back, or ask for a work or school excuse for 24 hours related to this e-Visit. If it has been greater than 24 hours you will need to follow up with your provider, or enter a new e-Visit to address those concerns.  You will get an e-mail in the next two days asking about your experience.  I hope that your e-visit has been valuable and will speed your recovery. Thank you for using e-visits.

## 2015-03-24 ENCOUNTER — Emergency Department (HOSPITAL_COMMUNITY): Payer: Self-pay

## 2015-03-24 ENCOUNTER — Encounter (HOSPITAL_COMMUNITY): Payer: Self-pay | Admitting: Emergency Medicine

## 2015-03-24 DIAGNOSIS — E669 Obesity, unspecified: Secondary | ICD-10-CM | POA: Insufficient documentation

## 2015-03-24 DIAGNOSIS — D649 Anemia, unspecified: Secondary | ICD-10-CM | POA: Insufficient documentation

## 2015-03-24 DIAGNOSIS — J069 Acute upper respiratory infection, unspecified: Secondary | ICD-10-CM | POA: Insufficient documentation

## 2015-03-24 DIAGNOSIS — I10 Essential (primary) hypertension: Secondary | ICD-10-CM | POA: Insufficient documentation

## 2015-03-24 DIAGNOSIS — Z79899 Other long term (current) drug therapy: Secondary | ICD-10-CM | POA: Insufficient documentation

## 2015-03-24 DIAGNOSIS — J45901 Unspecified asthma with (acute) exacerbation: Secondary | ICD-10-CM | POA: Insufficient documentation

## 2015-03-24 DIAGNOSIS — R079 Chest pain, unspecified: Secondary | ICD-10-CM | POA: Insufficient documentation

## 2015-03-24 MED ORDER — ALBUTEROL SULFATE (2.5 MG/3ML) 0.083% IN NEBU
INHALATION_SOLUTION | RESPIRATORY_TRACT | Status: AC
  Filled 2015-03-24: qty 6

## 2015-03-24 MED ORDER — ALBUTEROL SULFATE (2.5 MG/3ML) 0.083% IN NEBU
5.0000 mg | INHALATION_SOLUTION | Freq: Once | RESPIRATORY_TRACT | Status: AC
  Administered 2015-03-24: 5 mg via RESPIRATORY_TRACT

## 2015-03-24 NOTE — ED Notes (Signed)
Patient here with complaint of shortness of breath and wheezing for a few days. States she has asthma. Audible wheezing from bedside in triage.

## 2015-03-25 ENCOUNTER — Emergency Department (HOSPITAL_COMMUNITY)
Admission: EM | Admit: 2015-03-25 | Discharge: 2015-03-25 | Disposition: A | Discharge status: Home / Self Care | Payer: Self-pay | Attending: Emergency Medicine | Admitting: Emergency Medicine

## 2015-03-25 ENCOUNTER — Ambulatory Visit: Payer: 59 | Admitting: Internal Medicine

## 2015-03-25 DIAGNOSIS — J069 Acute upper respiratory infection, unspecified: Secondary | ICD-10-CM

## 2015-03-25 DIAGNOSIS — J45901 Unspecified asthma with (acute) exacerbation: Secondary | ICD-10-CM

## 2015-03-25 MED ORDER — ALBUTEROL SULFATE HFA 108 (90 BASE) MCG/ACT IN AERS: 1.0000 | INHALATION_SPRAY | Freq: Four times a day (QID) | RESPIRATORY_TRACT | Status: DC | PRN

## 2015-03-25 MED ORDER — IPRATROPIUM-ALBUTEROL 0.5-2.5 (3) MG/3ML IN SOLN
3.0000 mL | Freq: Once | RESPIRATORY_TRACT | Status: AC
  Administered 2015-03-25: 3 mL via RESPIRATORY_TRACT
  Filled 2015-03-25: qty 3

## 2015-03-25 MED ORDER — PREDNISONE 20 MG PO TABS
60.0000 mg | ORAL_TABLET | Freq: Once | ORAL | Status: AC
  Administered 2015-03-25: 60 mg via ORAL
  Filled 2015-03-25: qty 3

## 2015-03-25 MED ORDER — FLUTICASONE PROPIONATE 50 MCG/ACT NA SUSP: 1.0000 | Freq: Every day | NASAL | Status: DC

## 2015-03-25 MED ORDER — SALINE SPRAY 0.65 % NA SOLN: 1.0000 | NASAL | Status: AC | PRN

## 2015-03-25 MED ORDER — PREDNISONE 20 MG PO TABS: 60.0000 mg | ORAL_TABLET | Freq: Every day | ORAL | Status: DC

## 2015-03-25 NOTE — Discharge Instructions (Signed)
Asthma, Adult °Asthma is a recurring condition in which the airways tighten and narrow. Asthma can make it difficult to breathe. It can cause coughing, wheezing, and shortness of breath. Asthma episodes, also called asthma attacks, range from minor to life-threatening. Asthma cannot be cured, but medicines and lifestyle changes can help control it. °CAUSES °Asthma is believed to be caused by inherited (genetic) and environmental factors, but its exact cause is unknown. Asthma may be triggered by allergens, lung infections, or irritants in the air. Asthma triggers are different for each person. Common triggers include:  °· Animal dander. °· Dust mites. °· Cockroaches. °· Pollen from trees or grass. °· Mold. °· Smoke. °· Air pollutants such as dust, household cleaners, hair sprays, aerosol sprays, paint fumes, strong chemicals, or strong odors. °· Cold air, weather changes, and winds (which increase molds and pollens in the air). °· Strong emotional expressions such as crying or laughing hard. °· Stress. °· Certain medicines (such as aspirin) or types of drugs (such as beta-blockers). °· Sulfites in foods and drinks. Foods and drinks that may contain sulfites include dried fruit, potato chips, and sparkling grape juice. °· Infections or inflammatory conditions such as the flu, a cold, or an inflammation of the nasal membranes (rhinitis). °· Gastroesophageal reflux disease (GERD). °· Exercise or strenuous activity. °SYMPTOMS °Symptoms may occur immediately after asthma is triggered or many hours later. Symptoms include: °· Wheezing. °· Excessive nighttime or early morning coughing. °· Frequent or severe coughing with a common cold. °· Chest tightness. °· Shortness of breath. °DIAGNOSIS  °The diagnosis of asthma is made by a review of your medical history and a physical exam. Tests may also be performed. These may include: °· Lung function studies. These tests show how much air you breathe in and out. °· Allergy  tests. °· Imaging tests such as X-rays. °TREATMENT  °Asthma cannot be cured, but it can usually be controlled. Treatment involves identifying and avoiding your asthma triggers. It also involves medicines. There are 2 classes of medicine used for asthma treatment:  °· Controller medicines. These prevent asthma symptoms from occurring. They are usually taken every day. °· Reliever or rescue medicines. These quickly relieve asthma symptoms. They are used as needed and provide short-term relief. °Your health care provider will help you create an asthma action plan. An asthma action plan is a written plan for managing and treating your asthma attacks. It includes a list of your asthma triggers and how they may be avoided. It also includes information on when medicines should be taken and when their dosage should be changed. An action plan may also involve the use of a device called a peak flow meter. A peak flow meter measures how well the lungs are working. It helps you monitor your condition. °HOME CARE INSTRUCTIONS  °· Take medicines only as directed by your health care provider. Speak with your health care provider if you have questions about how or when to take the medicines. °· Use a peak flow meter as directed by your health care provider. Record and keep track of readings. °· Understand and use the action plan to help minimize or stop an asthma attack without needing to seek medical care. °· Control your home environment in the following ways to help prevent asthma attacks: °· Do not smoke. Avoid being exposed to secondhand smoke. °· Change your heating and air conditioning filter regularly. °· Limit your use of fireplaces and wood stoves. °· Get rid of pests (such as roaches   and mice) and their droppings. °· Throw away plants if you see mold on them. °· Clean your floors and dust regularly. Use unscented cleaning products. °· Try to have someone else vacuum for you regularly. Stay out of rooms while they are  being vacuumed and for a short while afterward. If you vacuum, use a dust mask from a hardware store, a double-layered or microfilter vacuum cleaner bag, or a vacuum cleaner with a HEPA filter. °· Replace carpet with wood, tile, or vinyl flooring. Carpet can trap dander and dust. °· Use allergy-proof pillows, mattress covers, and box spring covers. °· Wash bed sheets and blankets every week in hot water and dry them in a dryer. °· Use blankets that are made of polyester or cotton. °· Clean bathrooms and kitchens with bleach. If possible, have someone repaint the walls in these rooms with mold-resistant paint. Keep out of the rooms that are being cleaned and painted. °· Wash hands frequently. °SEEK MEDICAL CARE IF:  °· You have wheezing, shortness of breath, or a cough even if taking medicine to prevent attacks. °· The colored mucus you cough up (sputum) is thicker than usual. °· Your sputum changes from clear or white to yellow, green, gray, or bloody. °· You have any problems that may be related to the medicines you are taking (such as a rash, itching, swelling, or trouble breathing). °· You are using a reliever medicine more than 2-3 times per week. °· Your peak flow is still at 50-79% of your personal best after following your action plan for 1 hour. °· You have a fever. °SEEK IMMEDIATE MEDICAL CARE IF:  °· You seem to be getting worse and are unresponsive to treatment during an asthma attack. °· You are short of breath even at rest. °· You get short of breath when doing very little physical activity. °· You have difficulty eating, drinking, or talking due to asthma symptoms. °· You develop chest pain. °· You develop a fast heartbeat. °· You have a bluish color to your lips or fingernails. °· You are light-headed, dizzy, or faint. °· Your peak flow is less than 50% of your personal best. °  °This information is not intended to replace advice given to you by your health care provider. Make sure you discuss any  questions you have with your health care provider. °  °Document Released: 01/11/2005 Document Revised: 10/02/2014 Document Reviewed: 08/10/2012 °Elsevier Interactive Patient Education ©2016 [employer] °Upper Respiratory Infection, Adult °Most upper respiratory infections (URIs) are a viral infection of the air passages leading to the lungs. A URI affects the nose, throat, and upper air passages. The most common type of URI is nasopharyngitis and is typically referred to as "the common cold." °URIs run their course and usually go away on their own. Most of the time, a URI does not require medical attention, but sometimes a bacterial infection in the upper airways can follow a viral infection. This is called a secondary infection. Sinus and middle ear infections are common types of secondary upper respiratory infections. °Bacterial pneumonia can also complicate a URI. A URI can worsen asthma and chronic obstructive pulmonary disease (COPD). Sometimes, these complications can require emergency medical care and may be life threatening.  °CAUSES °Almost all URIs are caused by viruses. A virus is a type of germ and can spread from one person to another.  °RISKS FACTORS °You may be at risk for a URI if:  °· You smoke.   °· You have chronic   heart or lung disease. °· You have a weakened defense (immune) system.   °· You are very young or very old.   °· You have nasal allergies or asthma. °· You work in crowded or poorly ventilated areas. °· You work in health care facilities or schools. °SIGNS AND SYMPTOMS  °Symptoms typically develop 2-3 days after you come in contact with a cold virus. Most viral URIs last 7-10 days. However, viral URIs from the influenza virus (flu virus) can last 14-18 days and are typically more severe. Symptoms may include:  °· Runny or stuffy (congested) nose.   °· Sneezing.   °· Cough.   °· Sore throat.   °· Headache.   °· Fatigue.   °· Fever.   °· Loss of appetite.   °· Pain in your forehead,  behind your eyes, and over your cheekbones (sinus pain). °· Muscle aches.   °DIAGNOSIS  °Your health care provider may diagnose a URI by: °· Physical exam. °· Tests to check that your symptoms are not due to another condition such as: °¨ Strep throat. °¨ Sinusitis. °¨ Pneumonia. °¨ Asthma. °TREATMENT  °A URI goes away on its own with time. It cannot be cured with medicines, but medicines may be prescribed or recommended to relieve symptoms. Medicines may help: °· Reduce your fever. °· Reduce your cough. °· Relieve nasal congestion. °HOME CARE INSTRUCTIONS  °· Take medicines only as directed by your health care provider.   °· Gargle warm saltwater or take cough drops to comfort your throat as directed by your health care provider. °· Use a warm mist humidifier or inhale steam from a shower to increase air moisture. This may make it easier to breathe. °· Drink enough fluid to keep your urine clear or pale yellow.   °· Eat soups and other clear broths and maintain good nutrition.   °· Rest as needed.   °· Return to work when your temperature has returned to normal or as your health care provider advises. You may need to stay home longer to avoid infecting others. You can also use a face mask and careful hand washing to prevent spread of the virus. °· Increase the usage of your inhaler if you have asthma.   °· Do not use any tobacco products, including cigarettes, chewing tobacco, or electronic cigarettes. If you need help quitting, ask your health care provider. °PREVENTION  °The best way to protect yourself from getting a cold is to practice good hygiene.  °· Avoid oral or hand contact with people with cold symptoms.   °· Wash your hands often if contact occurs.   °There is no clear evidence that vitamin C, vitamin E, echinacea, or exercise reduces the chance of developing a cold. However, it is always recommended to get plenty of rest, exercise, and practice good nutrition.  °SEEK MEDICAL CARE IF:  °· You are getting  worse rather than better.   °· Your symptoms are not controlled by medicine.   °· You have chills. °· You have worsening shortness of breath. °· You have brown or red mucus. °· You have yellow or brown nasal discharge. °· You have pain in your face, especially when you bend forward. °· You have a fever. °· You have swollen neck glands. °· You have pain while swallowing. °· You have white areas in the back of your throat. °SEEK IMMEDIATE MEDICAL CARE IF:  °· You have severe or persistent: °¨ Headache. °¨ Ear pain. °¨ Sinus pain. °¨ Chest pain. °· You have chronic lung disease and any of the following: °¨ Wheezing. °¨ Prolonged cough. °¨ Coughing up   blood. °¨ A change in your usual mucus. °· You have a stiff neck. °· You have changes in your: °¨ Vision. °¨ Hearing. °¨ Thinking. °¨ Mood. °MAKE SURE YOU:  °· Understand these instructions. °· Will watch your condition. °· Will get help right away if you are not doing well or get worse. °  °This information is not intended to replace advice given to you by your health care provider. Make sure you discuss any questions you have with your health care provider. °  °Document Released: 07/07/2000 Document Revised: 05/28/2014 Document Reviewed: 04/18/2013 °Elsevier Interactive Patient Education ©2016 [employer] Inc. ° °

## 2015-03-25 NOTE — ED Provider Notes (Signed)
CSN: [id number]     Arrival date & time 03/24/15  2055 History   By signing my name below, I, [name], attest that this documentation has been prepared under the direction and in the presence of Amy Baton, MD.  Electronically Signed: Arlan Townsend, ED Scribe. 03/25/2015. 1:54 AM.   Chief Complaint  Patient presents with  . Cough  . Asthma  . Shortness of Breath   The history is provided by the patient. No language interpreter was used.    HPI Comments: [name] is a 45 y.o. female with a PMHx of asthma and anemia who presents to the Emergency Department complaining of constant, ongoing shortness of breath x 2-3 days. Pt also reports chest discomfort, cough, wheezing, and congestion. No aggravating or alleviating factors at this time. At home breathing treatment attempted at home with mild temporary improvement. No recent fever, chills,  Nausea, vomiting, or abdominal pain.  PCP: Amy Broker, MD    Past Medical History  Diagnosis Date  . Hypertension   . Bulging disc   . Asthma   . PONV (postoperative nausea and vomiting)     PT IS [employer]  . Obesity   . Anemia    Past Surgical History  Procedure Laterality Date  . Cholecystectomy    . Gastric bypass    . Laparoscopic tubal ligation Bilateral 09/20/2013    Procedure: LAPAROSCOPIC TUBAL LIGATION;  Surgeon: Amy Mire, MD;  Location: [facility];  Service: Gynecology;  Laterality: Bilateral;  . Dilitation & currettage/hystroscopy with novasure ablation N/A 09/20/2013    Procedure: DILATATION & CURETTAGE/HYSTEROSCOPY WITH NOVASURE ABLATION;  Surgeon: Amy Mire, MD;  Location: [facility];  Service: Gynecology;  Laterality: N/A;   Family History  Problem Relation Age of Onset  . Hypertension Sister    Social History  Substance Use Topics  . Smoking status: Never Smoker   . Smokeless tobacco: None  . Alcohol Use: No   OB History    No data available     Review of Systems  Constitutional:  Negative for fever and chills.  HENT: Positive for congestion.   Respiratory: Positive for cough, shortness of breath and wheezing.   Cardiovascular: Positive for chest pain.  Gastrointestinal: Negative for nausea, vomiting and abdominal pain.  Neurological: Negative for headaches.  Psychiatric/Behavioral: Negative for confusion.  All other systems reviewed and are negative.     Allergies  Review of patient's allergies indicates no known allergies.  Home Medications   Prior to Admission medications   Medication Sig Start Date End Date Taking? Authorizing Provider  cetirizine (ZYRTEC) 10 MG tablet Take 10 mg by mouth daily.   Yes Historical Provider, MD  ferrous sulfate 325 (65 FE) MG tablet Take 325 mg by mouth daily with breakfast.   Yes Historical Provider, MD  metoprolol (LOPRESSOR) 50 MG tablet Take 25 mg by mouth 2 (two) times daily.   Yes Historical Provider, MD  albuterol (PROVENTIL HFA;VENTOLIN HFA) 108 (90 Base) MCG/ACT inhaler Inhale 1 puff into the lungs every 6 (six) hours as needed for wheezing or shortness of breath. 03/25/15   Amy Baton, MD  ALPRAZolam Prudy Feeler) 0.25 MG tablet take 1 tablet by mouth once daily if needed for anxiety Patient not taking: Reported on 03/25/2015 10/24/14   Amy Broker, MD  amoxicillin-clavulanate (AUGMENTIN) 875-125 MG tablet Take 1 tablet by mouth 2 (two) times daily. Patient not taking: Reported on 03/25/2015 12/24/14   Amy Broker, MD  celecoxib (CELEBREX) 200 MG capsule Take 1 capsule (200 mg total) by mouth 2 (two) times daily. Patient not taking: Reported on 03/25/2015 09/25/14   Amy Broker, MD  cyanocobalamin (,VITAMIN B-12,) 1000 MCG/ML injection Inject 1 mL (1,000 mcg total) into the muscle every 30 (thirty) days. Patient not taking: Reported on 03/25/2015 09/25/14   Amy Broker, MD  diclofenac (FLECTOR) 1.3 % PTCH Place 1 patch onto the skin 2 (two) times daily. Patient not taking: Reported on  03/25/2015 11/14/14   Amy Broker, MD  fluconazole (DIFLUCAN) 150 MG tablet Take 1 tablet (150 mg total) by mouth once. Patient not taking: Reported on 03/25/2015 12/24/14   Amy Broker, MD  fluconazole (DIFLUCAN) 150 MG tablet Take 1 tablet (150 mg total) by mouth once. Patient not taking: Reported on 03/25/2015 03/02/15   Amy Fanny, FNP  fluticasone (FLONASE) 50 MCG/ACT nasal spray Place 1 spray into both nostrils daily. 03/25/15   Amy Baton, MD  hyoscyamine (LEVSIN SL) 0.125 MG SL tablet take 1 tablet by mouth every 4 hours if needed Patient not taking: Reported on 03/25/2015 10/24/14   Amy Broker, MD  levofloxacin (LEVAQUIN) 500 MG tablet Take 1 tablet (500 mg total) by mouth daily. Patient not taking: Reported on 03/25/2015 03/02/15   Amy Fanny, FNP  metoprolol (LOPRESSOR) 50 MG tablet take 1 tablet by mouth once daily Patient not taking: Reported on 03/25/2015 06/25/14   Amy Broker, MD  predniSONE (DELTASONE) 20 MG tablet Take 3 tablets (60 mg total) by mouth daily with breakfast. 03/25/15   Amy Baton, MD  sodium chloride (OCEAN) 0.65 % SOLN nasal spray Place 1 spray into both nostrils as needed for congestion. 03/25/15   Amy Baton, MD  traMADol (ULTRAM) 50 MG tablet TAKE ONE TABLET BY MOUTH AT BEDTIME AS NEEDED Patient not taking: Reported on 03/25/2015 12/27/14   Amy Broker, MD   Triage Vitals: BP 140/82 mmHg  Pulse 91  Temp(Src) 98.4 F (36.9 C) (Oral)  Resp 16  Ht 5\' 1"  (1.549 m)  Wt 305 lb (138.347 kg)  BMI 57.66 kg/m2  SpO2 96%   Physical Exam  Constitutional: She is oriented to person, place, and time. She appears well-developed and well-nourished.  Obese  HENT:  Head: Normocephalic and atraumatic.  Nasal congestion noted Fluid noted behind right ear  Eyes: Pupils are equal, round, and reactive to light.  Injected conjunctiva  Cardiovascular: Normal rate, regular rhythm and normal heart sounds.   No  murmur heard. Pulmonary/Chest: Effort normal. No respiratory distress. She has wheezes.  Fair air movement, end expiratory wheezing  Abdominal: Soft. Bowel sounds are normal. There is no tenderness. There is no rebound.  Musculoskeletal: She exhibits no edema.  Neurological: She is alert and oriented to person, place, and time.  Skin: Skin is warm and dry.  Psychiatric: She has a normal mood and affect.  Nursing note and vitals reviewed.   ED Course  Procedures (including critical care time)  DIAGNOSTIC STUDIES: Oxygen Saturation is 100% on RA, Normal by my interpretation.    COORDINATION OF CARE: 1:52 AM- Will give breathing treatment. Will order CXR. Discussed treatment plan with pt at bedside and pt agreed to plan.     Labs Review Labs Reviewed - No data to display  Imaging Review Dg Chest 2 View  03/24/2015  CLINICAL DATA:  Acute onset of cough and shortness of breath. Initial encounter. EXAM: CHEST  2 VIEW  COMPARISON:  Chest radiograph performed 04/13/2014 FINDINGS: The lungs are well-aerated. Peribronchial thickening is noted. There is no evidence of focal opacification, pleural effusion or pneumothorax. The heart is normal in size; the mediastinal contour is within normal limits. No acute osseous abnormalities are seen. New IMPRESSION: Peribronchial thickening noted.  Lungs otherwise clear. Electronically Signed   By: Roanna Raider M.D.   On: 03/24/2015 22:20   I have personally reviewed and evaluated these images and lab results as part of my medical decision-making.   EKG Interpretation None      MDM   Final diagnoses:  Asthma exacerbation  URI (upper respiratory infection)     Patient presents with upper respiratory symptoms and wheezing. History of asthma. She is wheezing on exam but otherwise nontoxic. Afebrile. Chest x-ray without evidence of pneumonia.  Suspect viral etiology.  Patient was given a DuoNeb and steroids. She received 3 DuoNeb's in total with  interval improvement of her breath sounds. She ambulated and maintain pulse ox greater than 93%.  Discuss with patient supportive measures at home for URI including nasal saline, Flonase. She'll be given prednisone for her apparent asthma exacerbation. No indication for antibodies at this time.  After history, exam, and medical workup I feel the patient has been appropriately medically screened and is safe for discharge home. Pertinent diagnoses were discussed with the patient. Patient was given return precautions.  I personally performed the services described in this documentation, which was scribed in my presence. The recorded information has been reviewed and is accurate.   Amy Baton, MD 03/25/15 2256

## 2015-03-25 NOTE — ED Notes (Signed)
Pulse ox while laying down was 96%. Sp02 while ambulating was 93%

## 2015-03-27 ENCOUNTER — Encounter: Payer: Self-pay | Admitting: Internal Medicine

## 2015-03-27 ENCOUNTER — Ambulatory Visit (INDEPENDENT_AMBULATORY_CARE_PROVIDER_SITE_OTHER): Payer: BLUE CROSS/BLUE SHIELD | Admitting: Internal Medicine

## 2015-03-27 ENCOUNTER — Other Ambulatory Visit (INDEPENDENT_AMBULATORY_CARE_PROVIDER_SITE_OTHER): Payer: BLUE CROSS/BLUE SHIELD

## 2015-03-27 VITALS — BP 130/88 | HR 49 | Temp 98.3°F | Resp 16 | Ht 65.0 in | Wt 342.0 lb

## 2015-03-27 DIAGNOSIS — E538 Deficiency of other specified B group vitamins: Secondary | ICD-10-CM | POA: Diagnosis not present

## 2015-03-27 DIAGNOSIS — Z9889 Other specified postprocedural states: Secondary | ICD-10-CM

## 2015-03-27 DIAGNOSIS — Z9884 Bariatric surgery status: Secondary | ICD-10-CM

## 2015-03-27 LAB — COMPREHENSIVE METABOLIC PANEL
ALK PHOS: 82 U/L (ref 39–117)
ALT: 12 U/L (ref 0–35)
AST: 19 U/L (ref 0–37)
Albumin: 3.5 g/dL (ref 3.5–5.2)
BILIRUBIN TOTAL: 0.3 mg/dL (ref 0.2–1.2)
BUN: 13 mg/dL (ref 6–23)
CO2: 30 meq/L (ref 19–32)
CREATININE: 0.6 mg/dL (ref 0.40–1.20)
Calcium: 8.7 mg/dL (ref 8.4–10.5)
Chloride: 108 mEq/L (ref 96–112)
GFR: 115.64 mL/min (ref >60.00)
GLUCOSE: 89 mg/dL (ref 70–99)
Potassium: 4 mEq/L (ref 3.5–5.1)
SODIUM: 143 meq/L (ref 135–145)
TOTAL PROTEIN: 6.1 g/dL (ref 6.0–8.3)

## 2015-03-27 LAB — CBC
HEMATOCRIT: 36.3 % (ref 36.0–46.0)
Hemoglobin: 11.5 g/dL — ABNORMAL LOW (ref 12.0–15.0)
MCHC: 31.7 g/dL (ref 30.0–36.0)
MCV: 78.3 fl (ref 78.0–100.0)
Platelets: 250 10*3/uL (ref 150.0–400.0)
RBC: 4.63 Mil/uL (ref 3.87–5.11)
RDW: 15.6 % — ABNORMAL HIGH (ref 11.5–15.5)
WBC: 5.8 10*3/uL (ref 4.0–10.5)

## 2015-03-27 LAB — LIPID PANEL
Cholesterol: 118 mg/dL (ref 0–200)
HDL: 48.1 mg/dL (ref >39.00)
LDL Cholesterol: 58 mg/dL (ref 0–99)
NONHDL: 69.41
Total CHOL/HDL Ratio: 2
Triglycerides: 56 mg/dL (ref 0.0–149.0)
VLDL: 11.2 mg/dL (ref 0.0–40.0)

## 2015-03-27 LAB — VITAMIN B12: Vitamin B-12: 326 pg/mL (ref 211–911)

## 2015-03-27 LAB — FERRITIN: FERRITIN: 3.3 ng/mL — AB (ref 10.0–291.0)

## 2015-03-27 NOTE — Assessment & Plan Note (Signed)
Did not do well on oral replacement so on B12 shots. Checking level today. Adjust as needed. If still low will move shots to every 2 weeks instead of monthly

## 2015-03-27 NOTE — Progress Notes (Signed)
Pre visit review using our clinic review tool, if applicable. No additional management support is needed unless otherwise documented below in the visit note. 

## 2015-03-27 NOTE — Patient Instructions (Signed)
We are checking the labs today and will call you back with the results.   The lungs sound better so finish the prednisone and you should be better.

## 2015-03-27 NOTE — Progress Notes (Signed)
   Subjective:    Patient ID: [name], female    DOB: 03/04/1970, 45 y.o.   MRN: [id number]  HPI The patient is a 45 YO female coming in for follow up of her B12 deficiency. She has been taking the shots. She has history of gastric bypass and has chronic deficiency. Has been taking her multivitamin. No new numbness or tingling pains. Weight is up some this visit.   Review of Systems  Constitutional: Positive for activity change. Negative for fever, appetite change, fatigue and unexpected weight change.       Not exercising  HENT: Negative for congestion, postnasal drip, rhinorrhea and sinus pressure.   Respiratory: Negative for cough, chest tightness, shortness of breath and wheezing.   Cardiovascular: Negative for chest pain, palpitations and leg swelling.  Gastrointestinal: Negative for nausea, abdominal pain, diarrhea, constipation and abdominal distention.  Musculoskeletal: Positive for arthralgias and gait problem. Negative for myalgias and back pain.  Skin: Negative.   Neurological: Negative.   Psychiatric/Behavioral: Negative.       Objective:   Physical Exam  Constitutional: She is oriented to person, place, and time. She appears well-developed and well-nourished.  Obese  HENT:  Head: Normocephalic and atraumatic.  Right Ear: External ear normal.  Left Ear: External ear normal.  Eyes: EOM are normal.  Neck: Normal range of motion.  Cardiovascular: Normal rate and regular rhythm.   Pulmonary/Chest: Effort normal and breath sounds normal. No respiratory distress. She has no wheezes. She has no rales.  Abdominal: Soft. Bowel sounds are normal. She exhibits no distension.  Musculoskeletal: She exhibits tenderness.  Left knee  Neurological: She is alert and oriented to person, place, and time.  Slow to stand, uses arms to push up.  Skin: Skin is warm and dry.  Psychiatric: She has a normal mood and affect. Her behavior is normal.   Filed Vitals:   03/27/15 0841    BP: 130/88  Pulse: 49  Temp: 98.3 F (36.8 C)  TempSrc: Oral  Resp: 16  Height:  (1.651 m)  Weight: 342 lb (155.13 kg)  SpO2: 98%      Assessment & Plan:

## 2015-03-27 NOTE — Assessment & Plan Note (Signed)
Checking labs today for nutritional deficiency. Keep using B12 shots for now.

## 2015-04-03 ENCOUNTER — Encounter: Payer: Self-pay | Admitting: Internal Medicine

## 2015-04-03 ENCOUNTER — Other Ambulatory Visit: Payer: Self-pay | Admitting: Internal Medicine

## 2015-04-03 NOTE — Telephone Encounter (Signed)
Sent to pharmacy 

## 2015-04-04 ENCOUNTER — Other Ambulatory Visit: Payer: Self-pay | Admitting: Geriatric Medicine

## 2015-04-04 MED ORDER — METOPROLOL TARTRATE 50 MG PO TABS: 25.0000 mg | ORAL_TABLET | Freq: Two times a day (BID) | ORAL | Status: DC

## 2015-04-04 MED ORDER — HYOSCYAMINE SULFATE 0.125 MG SL SUBL: SUBLINGUAL_TABLET | SUBLINGUAL | Status: DC

## 2015-04-08 ENCOUNTER — Telehealth: Payer: Self-pay

## 2015-04-08 NOTE — Telephone Encounter (Signed)
PA initiated and approved via CoverMyMeds Key [id number] thru 01/24/2038

## 2015-05-26 ENCOUNTER — Encounter: Payer: Self-pay | Admitting: Internal Medicine

## 2015-05-26 MED ORDER — BUPROPION HCL ER (XL) 150 MG PO TB24: 150.0000 mg | ORAL_TABLET | Freq: Every day | ORAL | Status: DC

## 2015-07-04 ENCOUNTER — Ambulatory Visit (INDEPENDENT_AMBULATORY_CARE_PROVIDER_SITE_OTHER): Payer: BLUE CROSS/BLUE SHIELD | Admitting: Internal Medicine

## 2015-07-04 ENCOUNTER — Encounter: Payer: Self-pay | Admitting: Internal Medicine

## 2015-07-04 VITALS — BP 110/68 | HR 59 | Temp 98.1°F | Resp 16 | Ht 65.0 in | Wt 347.1 lb

## 2015-07-04 DIAGNOSIS — F4323 Adjustment disorder with mixed anxiety and depressed mood: Secondary | ICD-10-CM | POA: Diagnosis not present

## 2015-07-04 NOTE — Progress Notes (Signed)
Pre visit review using our clinic review tool, if applicable. No additional management support is needed unless otherwise documented below in the visit note. 

## 2015-07-04 NOTE — Assessment & Plan Note (Signed)
Rx for welbutrin is helping more so we will continue with 150 mg daily. Given strategies for self stress reduction. She knows that counseling is an option but is not financially accessible right now.

## 2015-07-04 NOTE — Progress Notes (Signed)
   Subjective:    Patient ID: [name], female    DOB: 12/29/1970, 45 y.o.   MRN: [id number]  HPI The patient is a 45 YO female coming in for follow up of her adjustment disorder. She is having a much better time with having welbutrin. Her mood is less labile. Less crying spells and anger. She is working on having some alone time but with recent family members moving into her house it is difficult to get time for herself. No SI/HI.   Review of Systems  Constitutional: Positive for activity change. Negative for fever, appetite change, fatigue and unexpected weight change.       Not exercising  HENT: Negative for congestion, postnasal drip, rhinorrhea and sinus pressure.   Respiratory: Negative for cough, chest tightness, shortness of breath and wheezing.   Cardiovascular: Negative for chest pain, palpitations and leg swelling.  Gastrointestinal: Negative for nausea, abdominal pain, diarrhea, constipation and abdominal distention.  Musculoskeletal: Positive for arthralgias and gait problem. Negative for myalgias and back pain.  Skin: Negative.   Neurological: Negative.   Psychiatric/Behavioral: Positive for dysphoric mood and decreased concentration. Negative for suicidal ideas, behavioral problems, sleep disturbance, self-injury and agitation. The patient is not nervous/anxious.        Objective:   Physical Exam  Constitutional: She is oriented to person, place, and time. She appears well-developed and well-nourished.  Obese  HENT:  Head: Normocephalic and atraumatic.  Right Ear: External ear normal.  Left Ear: External ear normal.  Eyes: EOM are normal.  Neck: Normal range of motion.  Cardiovascular: Normal rate and regular rhythm.   Pulmonary/Chest: Effort normal and breath sounds normal. No respiratory distress. She has no wheezes. She has no rales.  Abdominal: Soft. Bowel sounds are normal. She exhibits no distension.  Musculoskeletal: She exhibits tenderness.  Left knee   Neurological: She is alert and oriented to person, place, and time.  Slow to stand, uses arms to push up.  Skin: Skin is warm and dry.  Psychiatric:  Some distress but mild   Filed Vitals:   07/04/15 1500  BP: 110/68  Pulse: 59  Temp: 98.1 F (36.7 C)  TempSrc: Oral  Resp: 16  Height: 5\' 5"  (1.651 m)  Weight: 347 lb 1.9 oz (157.453 kg)  SpO2: 98%      Assessment & Plan:

## 2015-07-04 NOTE — Patient Instructions (Signed)
It is okay to keep taking the welbutrin as long as you need.   Once you get to a more stable place we can talk about coming off if you want.   Stress and Stress Management Stress is a normal reaction to life events. It is what you feel when life demands more than you are used to or more than you can handle. Some stress can be useful. For example, the stress reaction can help you catch the last bus of the day, study for a test, or meet a deadline at work. But stress that occurs too often or for too long can cause problems. It can affect your emotional health and interfere with relationships and normal daily activities. Too much stress can weaken your immune system and increase your risk for physical illness. If you already have a medical problem, stress can make it worse. CAUSES  All sorts of life events may cause stress. An event that causes stress for one person may not be stressful for another person. Major life events commonly cause stress. These may be positive or negative. Examples include losing your job, moving into a new home, getting married, having a baby, or losing a loved one. Less obvious life events may also cause stress, especially if they occur day after day or in combination. Examples include working long hours, driving in traffic, caring for children, being in debt, or being in a difficult relationship. SIGNS AND SYMPTOMS Stress may cause emotional symptoms including, the following:  Anxiety. This is feeling worried, afraid, on edge, overwhelmed, or out of control.  Anger. This is feeling irritated or impatient.  Depression. This is feeling sad, down, helpless, or guilty.  Difficulty focusing, remembering, or making decisions. Stress may cause physical symptoms, including the following:   Aches and pains. These may affect your head, neck, back, stomach, or other areas of your body.  Tight muscles or clenched jaw.  Low energy or trouble sleeping. Stress may cause unhealthy  behaviors, including the following:   Eating to feel better (overeating) or skipping meals.  Sleeping too little, too much, or both.  Working too much or putting off tasks (procrastination).  Smoking, drinking alcohol, or using drugs to feel better. DIAGNOSIS  Stress is diagnosed through an assessment by your health care provider. Your health care provider will ask questions about your symptoms and any stressful life events.Your health care provider will also ask about your medical history and may order blood tests or other tests. Certain medical conditions and medicine can cause physical symptoms similar to stress. Mental illness can cause emotional symptoms and unhealthy behaviors similar to stress. Your health care provider may refer you to a mental health professional for further evaluation.  TREATMENT  Stress management is the recommended treatment for stress.The goals of stress management are reducing stressful life events and coping with stress in healthy ways.  Techniques for reducing stressful life events include the following:  Stress identification. Self-monitor for stress and identify what causes stress for you. These skills may help you to avoid some stressful events.  Time management. Set your priorities, keep a calendar of events, and learn to say "no." These tools can help you avoid making too many commitments. Techniques for coping with stress include the following:  Rethinking the problem. Try to think realistically about stressful events rather than ignoring them or overreacting. Try to find the positives in a stressful situation rather than focusing on the negatives.  Exercise. Physical exercise can release both physical and  emotional tension. The key is to find a form of exercise you enjoy and do it regularly.  Relaxation techniques. These relax the body and mind. Examples include yoga, meditation, tai chi, biofeedback, deep breathing, progressive muscle relaxation,  listening to music, being out in nature, journaling, and other hobbies. Again, the key is to find one or more that you enjoy and can do regularly.  Healthy lifestyle. Eat a balanced diet, get plenty of sleep, and do not smoke. Avoid using alcohol or drugs to relax.  Strong support network. Spend time with family, friends, or other people you enjoy being around.Express your feelings and talk things over with someone you trust. Counseling or talktherapy with a mental health professional may be helpful if you are having difficulty managing stress on your own. Medicine is typically not recommended for the treatment of stress.Talk to your health care provider if you think you need medicine for symptoms of stress. HOME CARE INSTRUCTIONS  Keep all follow-up visits as directed by your health care provider.  Take all medicines as directed by your health care provider. SEEK MEDICAL CARE IF:  Your symptoms get worse or you start having new symptoms.  You feel overwhelmed by your problems and can no longer manage them on your own. SEEK IMMEDIATE MEDICAL CARE IF:  You feel like hurting yourself or someone else.   This information is not intended to replace advice given to you by your health care provider. Make sure you discuss any questions you have with your health care provider.   Document Released: 07/07/2000 Document Revised: 02/01/2014 Document Reviewed: 09/05/2012 Elsevier Interactive Patient Education [employer].

## 2015-07-07 ENCOUNTER — Encounter: Payer: Self-pay | Admitting: Internal Medicine

## 2015-07-11 ENCOUNTER — Telehealth: Payer: BLUE CROSS/BLUE SHIELD | Admitting: Family

## 2015-07-11 DIAGNOSIS — A499 Bacterial infection, unspecified: Secondary | ICD-10-CM

## 2015-07-11 DIAGNOSIS — B9689 Other specified bacterial agents as the cause of diseases classified elsewhere: Secondary | ICD-10-CM

## 2015-07-11 DIAGNOSIS — J329 Chronic sinusitis, unspecified: Secondary | ICD-10-CM

## 2015-07-11 MED ORDER — PREDNISONE 10 MG (21) PO TBPK: 10.0000 mg | ORAL_TABLET | ORAL | Status: DC

## 2015-07-11 MED ORDER — AMOXICILLIN-POT CLAVULANATE 875-125 MG PO TABS: 1.0000 | ORAL_TABLET | Freq: Two times a day (BID) | ORAL | Status: AC

## 2015-07-11 MED ORDER — FLUCONAZOLE 150 MG PO TABS: 150.0000 mg | ORAL_TABLET | Freq: Once | ORAL | Status: DC

## 2015-07-11 NOTE — Progress Notes (Signed)
We are sorry that you are not feeling well.  Here is how we plan to help!  Based on what you have shared with me it looks like you have sinusitis.  Sinusitis is inflammation and infection in the sinus cavities of the head.  Based on your presentation I believe you most likely have Acute Bacterial Sinusitis.  This is an infection caused by bacteria and is treated with antibiotics. I have prescribed Augmentin 875mg /125mg  one tablet twice daily with food, for 7 days. You may use an oral decongestant such as Mucinex D or if you have glaucoma or high blood pressure use plain Mucinex. Saline nasal spray help and can safely be used as often as needed for congestion.  If you develop worsening sinus pain, fever or notice severe headache and vision changes, or if symptoms are not better after completion of antibiotic, please schedule an appointment with a health care provider.    I have also prescribed steroids and diflucan per your request.   Sinus infections are not as easily transmitted as other respiratory infection, however we still recommend that you avoid close contact with loved ones, especially the very young and elderly.  Remember to wash your hands thoroughly throughout the day as this is the number one way to prevent the spread of infection!  Home Care:  Only take medications as instructed by your medical team.  Complete the entire course of an antibiotic.  Do not take these medications with alcohol.  A steam or ultrasonic humidifier can help congestion.  You can place a towel over your head and breathe in the steam from hot water coming from a faucet.  Avoid close contacts especially the very young and the elderly.  Cover your mouth when you cough or sneeze.  Always remember to wash your hands.  Get Help Right Away If:  You develop worsening fever or sinus pain.  You develop a severe head ache or visual changes.  Your symptoms persist after you have completed your treatment  plan.  Make sure you  Understand these instructions.  Will watch your condition.  Will get help right away if you are not doing well or get worse.  Your e-visit answers were reviewed by a board certified advanced clinical practitioner to complete your personal care plan.  Depending on the condition, your plan could have included both over the counter or prescription medications.  If there is a problem please reply  once you have received a response from your provider.  Your safety is important to us.  If you have drug allergies check your prescription carefully.    You can use MyChart to ask questions about today's visit, request a non-urgent call back, or ask for a work or school excuse for 24 hours related to this e-Visit. If it has been greater than 24 hours you will need to follow up with your provider, or enter a new e-Visit to address those concerns.  You will get an e-mail in the next two days asking about your experience.  I hope that your e-visit has been valuable and will speed your recovery. Thank you for using e-visits.

## 2015-08-20 ENCOUNTER — Other Ambulatory Visit: Payer: Self-pay | Admitting: Family

## 2015-08-20 DIAGNOSIS — B9689 Other specified bacterial agents as the cause of diseases classified elsewhere: Secondary | ICD-10-CM

## 2015-08-20 DIAGNOSIS — J329 Chronic sinusitis, unspecified: Secondary | ICD-10-CM

## 2015-08-26 ENCOUNTER — Other Ambulatory Visit: Payer: Self-pay | Admitting: Internal Medicine

## 2015-08-26 ENCOUNTER — Encounter: Payer: Self-pay | Admitting: Internal Medicine

## 2015-08-26 ENCOUNTER — Other Ambulatory Visit: Payer: Self-pay | Admitting: Family

## 2015-08-26 ENCOUNTER — Telehealth: Payer: BLUE CROSS/BLUE SHIELD | Admitting: Family

## 2015-08-26 DIAGNOSIS — J329 Chronic sinusitis, unspecified: Secondary | ICD-10-CM

## 2015-08-26 DIAGNOSIS — B9689 Other specified bacterial agents as the cause of diseases classified elsewhere: Secondary | ICD-10-CM

## 2015-08-26 DIAGNOSIS — A499 Bacterial infection, unspecified: Secondary | ICD-10-CM

## 2015-08-26 MED ORDER — FLUCONAZOLE 150 MG PO TABS: 150.0000 mg | ORAL_TABLET | Freq: Once | ORAL | 0 refills | Status: AC

## 2015-08-26 MED ORDER — AMOXICILLIN-POT CLAVULANATE 875-125 MG PO TABS: 1.0000 | ORAL_TABLET | Freq: Two times a day (BID) | ORAL | 0 refills | Status: DC

## 2015-08-26 NOTE — Progress Notes (Signed)

## 2015-09-03 ENCOUNTER — Ambulatory Visit: Payer: Self-pay | Admitting: Internal Medicine

## 2015-09-28 ENCOUNTER — Other Ambulatory Visit: Payer: Self-pay | Admitting: Internal Medicine

## 2015-09-30 ENCOUNTER — Encounter: Payer: Self-pay | Admitting: Internal Medicine

## 2015-09-30 ENCOUNTER — Other Ambulatory Visit (INDEPENDENT_AMBULATORY_CARE_PROVIDER_SITE_OTHER): Payer: BLUE CROSS/BLUE SHIELD

## 2015-09-30 ENCOUNTER — Ambulatory Visit (INDEPENDENT_AMBULATORY_CARE_PROVIDER_SITE_OTHER): Payer: BLUE CROSS/BLUE SHIELD | Admitting: Internal Medicine

## 2015-09-30 VITALS — BP 132/82 | HR 66 | Temp 97.9°F | Resp 16 | Ht 65.0 in | Wt 347.0 lb

## 2015-09-30 DIAGNOSIS — Z0000 Encounter for general adult medical examination without abnormal findings: Secondary | ICD-10-CM | POA: Diagnosis not present

## 2015-09-30 DIAGNOSIS — Z9889 Other specified postprocedural states: Secondary | ICD-10-CM

## 2015-09-30 DIAGNOSIS — Z23 Encounter for immunization: Secondary | ICD-10-CM

## 2015-09-30 DIAGNOSIS — Z9884 Bariatric surgery status: Secondary | ICD-10-CM

## 2015-09-30 LAB — COMPREHENSIVE METABOLIC PANEL
ALT: 10 U/L (ref 0–35)
AST: 15 U/L (ref 0–37)
Albumin: 3.4 g/dL — ABNORMAL LOW (ref 3.5–5.2)
Alkaline Phosphatase: 93 U/L (ref 39–117)
BUN: 11 mg/dL (ref 6–23)
CO2: 28 meq/L (ref 19–32)
Calcium: 8.2 mg/dL — ABNORMAL LOW (ref 8.4–10.5)
Chloride: 107 mEq/L (ref 96–112)
Creatinine, Ser: 0.64 mg/dL (ref 0.40–1.20)
GFR: 107.09 mL/min (ref >60.00)
GLUCOSE: 83 mg/dL (ref 70–99)
POTASSIUM: 3.6 meq/L (ref 3.5–5.1)
Sodium: 139 mEq/L (ref 135–145)
Total Bilirubin: 0.5 mg/dL (ref 0.2–1.2)
Total Protein: 6.1 g/dL (ref 6.0–8.3)

## 2015-09-30 LAB — LIPID PANEL
CHOL/HDL RATIO: 2
Cholesterol: 124 mg/dL (ref 0–200)
HDL: 53.8 mg/dL (ref >39.00)
LDL Cholesterol: 60 mg/dL (ref 0–99)
NonHDL: 69.96
Triglycerides: 48 mg/dL (ref 0.0–149.0)
VLDL: 9.6 mg/dL (ref 0.0–40.0)

## 2015-09-30 LAB — TSH: TSH: 2.1 u[IU]/mL (ref 0.35–4.50)

## 2015-09-30 LAB — VITAMIN B12: Vitamin B-12: 328 pg/mL (ref 211–911)

## 2015-09-30 LAB — FERRITIN: Ferritin: 3.1 ng/mL — ABNORMAL LOW (ref 10.0–291.0)

## 2015-09-30 LAB — T4, FREE: FREE T4: 1.07 ng/dL (ref 0.60–1.60)

## 2015-09-30 NOTE — Assessment & Plan Note (Signed)
Checking B12 today and adjust as needed.

## 2015-09-30 NOTE — Progress Notes (Signed)
   Subjective:    Patient ID: [name], female    DOB: 1970-10-19, 45 y.o.   MRN: [id number]  HPI The patient is a 45 YO female coming in for wellness. No new concerns. Doing better on the wellbutrin.   [facility], social history reviewed and updated.   Review of Systems  Constitutional: Negative for activity change, appetite change, fatigue, fever and unexpected weight change.       Not exercising  HENT: Negative for congestion, postnasal drip, rhinorrhea and sinus pressure.   Eyes: Negative.   Respiratory: Negative for cough, chest tightness, shortness of breath and wheezing.   Cardiovascular: Negative for chest pain, palpitations and leg swelling.  Gastrointestinal: Negative for abdominal distention, abdominal pain, constipation, diarrhea and nausea.  Musculoskeletal: Positive for arthralgias and gait problem. Negative for back pain and myalgias.  Skin: Negative.   Neurological: Negative for dizziness, weakness, light-headedness and numbness.  Psychiatric/Behavioral: Negative for agitation, behavioral problems, decreased concentration, dysphoric mood, self-injury, sleep disturbance and suicidal ideas. The patient is not nervous/anxious.       Objective:   Physical Exam  Constitutional: She is oriented to person, place, and time. She appears well-developed and well-nourished.  Obese  HENT:  Head: Normocephalic and atraumatic.  Right Ear: External ear normal.  Left Ear: External ear normal.  Eyes: EOM are normal.  Neck: Normal range of motion. No JVD present. No thyromegaly present.  Cardiovascular: Normal rate and regular rhythm.   Pulmonary/Chest: Effort normal and breath sounds normal. No respiratory distress. She has no wheezes. She has no rales.  Abdominal: Soft. Bowel sounds are normal. She exhibits no distension. There is no tenderness. There is no rebound.  Musculoskeletal: She exhibits tenderness.  Left knee  Neurological: She is alert and oriented to person,  place, and time.  Slow to stand, uses arms to push up.  Skin: Skin is warm and dry.  Psychiatric: She has a normal mood and affect.   Vitals:   09/30/15 0833  BP: 132/82  Pulse: 66  Resp: 16  Temp: 97.9 F (36.6 C)  TempSrc: Oral  SpO2: 98%  Weight: (!) 347 lb (157.4 kg)  Height: 5\' 5"  (1.651 m)      Assessment & Plan:  Flu shot given at visit.

## 2015-09-30 NOTE — Patient Instructions (Signed)
We will check the blood work today and call you back with the results.   We have given you the flu shot today.  Consider getting the injections in the knee as they can help people.   Health Maintenance, Female Adopting a healthy lifestyle and getting preventive care can go a long way to promote health and wellness. Talk with your health care provider about what schedule of regular examinations is right for you. This is a good chance for you to check in with your provider about disease prevention and staying healthy. In between checkups, there are plenty of things you can do on your own. Experts have done a lot of research about which lifestyle changes and preventive measures are most likely to keep you healthy. Ask your health care provider for more information. WEIGHT AND DIET  Eat a healthy diet  Be sure to include plenty of vegetables, fruits, low-fat dairy products, and lean protein.  Do not eat a lot of foods high in solid fats, added sugars, or salt.  Get regular exercise. This is one of the most important things you can do for your health.  Most adults should exercise for at least 150 minutes each week. The exercise should increase your heart rate and make you sweat (moderate-intensity exercise).  Most adults should also do strengthening exercises at least twice a week. This is in addition to the moderate-intensity exercise.  Maintain a healthy weight  Body mass index (BMI) is a measurement that can be used to identify possible weight problems. It estimates body fat based on height and weight. Your health care provider can help determine your BMI and help you achieve or maintain a healthy weight.  For females 32 years of age and older:   A BMI below 18.5 is considered underweight.  A BMI of 18.5 to 24.9 is normal.  A BMI of 25 to 29.9 is considered overweight.  A BMI of 30 and above is considered obese.  Watch levels of cholesterol and blood lipids  You should start  having your blood tested for lipids and cholesterol at 44 years of age, then have this test every 5 years.  You may need to have your cholesterol levels checked more often if:  Your lipid or cholesterol levels are high.  You are older than 44 years of age.  You are at high risk for heart disease.  CANCER SCREENING   Lung Cancer  Lung cancer screening is recommended for adults 26-65 years old who are at high risk for lung cancer because of a history of smoking.  A yearly low-dose CT scan of the lungs is recommended for people who:  Currently smoke.  Have quit within the past 15 years.  Have at least a 30-pack-year history of smoking. A pack year is smoking an average of one pack of cigarettes a day for 1 year.  Yearly screening should continue until it has been 15 years since you quit.  Yearly screening should stop if you develop a health problem that would prevent you from having lung cancer treatment.  Breast Cancer  Practice breast self-awareness. This means understanding how your breasts normally appear and feel.  It also means doing regular breast self-exams. Let your health care provider know about any changes, no matter how small.  If you are in your 20s or 30s, you should have a clinical breast exam (CBE) by a health care provider every 1-3 years as part of a regular health exam.  If you are  46 or older, have a CBE every year. Also consider having a breast X-ray (mammogram) every year.  If you have a family history of breast cancer, talk to your health care provider about genetic screening.  If you are at high risk for breast cancer, talk to your health care provider about having an MRI and a mammogram every year.  Breast cancer gene (BRCA) assessment is recommended for women who have family members with BRCA-related cancers. BRCA-related cancers include:  Breast.  Ovarian.  Tubal.  Peritoneal cancers.  Results of the assessment will determine the need for  genetic counseling and BRCA1 and BRCA2 testing. Cervical Cancer Your health care provider may recommend that you be screened regularly for cancer of the pelvic organs (ovaries, uterus, and vagina). This screening involves a pelvic examination, including checking for microscopic changes to the surface of your cervix (Pap test). You may be encouraged to have this screening done every 3 years, beginning at age 33.  For women ages 66-65, health care providers may recommend pelvic exams and Pap testing every 3 years, or they may recommend the Pap and pelvic exam, combined with testing for human papilloma virus (HPV), every 5 years. Some types of HPV increase your risk of cervical cancer. Testing for HPV may also be done on women of any age with unclear Pap test results.  Other health care providers may not recommend any screening for nonpregnant women who are considered low risk for pelvic cancer and who do not have symptoms. Ask your health care provider if a screening pelvic exam is right for you.  If you have had past treatment for cervical cancer or a condition that could lead to cancer, you need Pap tests and screening for cancer for at least 20 years after your treatment. If Pap tests have been discontinued, your risk factors (such as having a new sexual partner) need to be reassessed to determine if screening should resume. Some women have medical problems that increase the chance of getting cervical cancer. In these cases, your health care provider may recommend more frequent screening and Pap tests. Colorectal Cancer  This type of cancer can be detected and often prevented.  Routine colorectal cancer screening usually begins at 44 years of age and continues through 44 years of age.  Your health care provider may recommend screening at an earlier age if you have risk factors for colon cancer.  Your health care provider may also recommend using home test kits to check for hidden blood in the  stool.  A small camera at the end of a tube can be used to examine your colon directly (sigmoidoscopy or colonoscopy). This is done to check for the earliest forms of colorectal cancer.  Routine screening usually begins at age 90.  Direct examination of the colon should be repeated every 5-10 years through 44 years of age. However, you may need to be screened more often if early forms of precancerous polyps or small growths are found. Skin Cancer  Check your skin from head to toe regularly.  Tell your health care provider about any new moles or changes in moles, especially if there is a change in a mole's shape or color.  Also tell your health care provider if you have a mole that is larger than the size of a pencil eraser.  Always use sunscreen. Apply sunscreen liberally and repeatedly throughout the day.  Protect yourself by wearing long sleeves, pants, a wide-brimmed hat, and sunglasses whenever you are outside. HEART  DISEASE, DIABETES, AND HIGH BLOOD PRESSURE   High blood pressure causes heart disease and increases the risk of stroke. High blood pressure is more likely to develop in:  People who have blood pressure in the high end of the normal range (130-139/85-89 mm Hg).  People who are overweight or obese.  People who are African American.  If you are 69-71 years of age, have your blood pressure checked every 3-5 years. If you are 47 years of age or older, have your blood pressure checked every year. You should have your blood pressure measured twice--once when you are at a hospital or clinic, and once when you are not at a hospital or clinic. Record the average of the two measurements. To check your blood pressure when you are not at a hospital or clinic, you can use:  An automated blood pressure machine at a pharmacy.  A home blood pressure monitor.  If you are between 25 years and 92 years old, ask your health care provider if you should take aspirin to prevent  strokes.  Have regular diabetes screenings. This involves taking a blood sample to check your fasting blood sugar level.  If you are at a normal weight and have a low risk for diabetes, have this test once every three years after 44 years of age.  If you are overweight and have a high risk for diabetes, consider being tested at a younger age or more often. PREVENTING INFECTION  Hepatitis B  If you have a higher risk for hepatitis B, you should be screened for this virus. You are considered at high risk for hepatitis B if:  You were born in a country where hepatitis B is common. Ask your health care provider which countries are considered high risk.  Your parents were born in a high-risk country, and you have not been immunized against hepatitis B (hepatitis B vaccine).  You have HIV or AIDS.  You use needles to inject street drugs.  You live with someone who has hepatitis B.  You have had sex with someone who has hepatitis B.  You get hemodialysis treatment.  You take certain medicines for conditions, including cancer, organ transplantation, and autoimmune conditions. Hepatitis C  Blood testing is recommended for:  Everyone born from [birth year] through 1964.  Anyone with known risk factors for hepatitis C. Sexually transmitted infections (STIs)  You should be screened for sexually transmitted infections (STIs) including gonorrhea and chlamydia if:  You are sexually active and are younger than 44 years of age.  You are older than 44 years of age and your health care provider tells you that you are at risk for this type of infection.  Your sexual activity has changed since you were last screened and you are at an increased risk for chlamydia or gonorrhea. Ask your health care provider if you are at risk.  If you do not have HIV, but are at risk, it may be recommended that you take a prescription medicine daily to prevent HIV infection. This is called pre-exposure prophylaxis  (PrEP). You are considered at risk if:  You are sexually active and do not regularly use condoms or know the HIV status of your partner(s).  You take drugs by injection.  You are sexually active with a partner who has HIV. Talk with your health care provider about whether you are at high risk of being infected with HIV. If you choose to begin PrEP, you should first be tested for HIV. You should  then be tested every 3 months for as long as you are taking PrEP.  PREGNANCY   If you are premenopausal and you may become pregnant, ask your health care provider about preconception counseling.  If you may become pregnant, take 400 to 800 micrograms (mcg) of folic acid every day.  If you want to prevent pregnancy, talk to your health care provider about birth control (contraception). OSTEOPOROSIS AND MENOPAUSE   Osteoporosis is a disease in which the bones lose minerals and strength with aging. This can result in serious bone fractures. Your risk for osteoporosis can be identified using a bone density scan.  If you are 71 years of age or older, or if you are at risk for osteoporosis and fractures, ask your health care provider if you should be screened.  Ask your health care provider whether you should take a calcium or vitamin D supplement to lower your risk for osteoporosis.  Menopause may have certain physical symptoms and risks.  Hormone replacement therapy may reduce some of these symptoms and risks. Talk to your health care provider about whether hormone replacement therapy is right for you.  HOME CARE INSTRUCTIONS   Schedule regular health, dental, and eye exams.  Stay current with your immunizations.   Do not use any tobacco products including cigarettes, chewing tobacco, or electronic cigarettes.  If you are pregnant, do not drink alcohol.  If you are breastfeeding, limit how much and how often you drink alcohol.  Limit alcohol intake to no more than 1 drink per day for  nonpregnant women. One drink equals 12 ounces of beer, 5 ounces of wine, or 1 ounces of hard liquor.  Do not use street drugs.  Do not share needles.  Ask your health care provider for help if you need support or information about quitting drugs.  Tell your health care provider if you often feel depressed.  Tell your health care provider if you have ever been abused or do not feel safe at home.   This information is not intended to replace advice given to you by your health care provider. Make sure you discuss any questions you have with your health care provider.   Document Released: 07/27/2010 Document Revised: 02/01/2014 Document Reviewed: 12/13/2012 Elsevier Interactive Patient Education [employer].

## 2015-09-30 NOTE — Progress Notes (Signed)
Pre visit review using our clinic review tool, if applicable. No additional management support is needed unless otherwise documented below in the visit note. 

## 2015-09-30 NOTE — Assessment & Plan Note (Signed)
Checking her vitamin levels as ongoing surveillance post-op.

## 2015-09-30 NOTE — Assessment & Plan Note (Signed)
Weight is stable and her severe osteoarthritis limits her ability to exercise and lose weight.

## 2015-09-30 NOTE — Assessment & Plan Note (Signed)
BP at goal on her metoprolol 25 mg BID.

## 2015-09-30 NOTE — Assessment & Plan Note (Signed)
Checking labs, colonoscopy due at 51. She knows about mammogram screening. She did flu shot today. Counseled about sun safety and mole surveillance as well as the dangers of distracted driving. Given screening recommendations at the visit.

## 2015-10-01 LAB — HIV ANTIBODY (ROUTINE TESTING W REFLEX): HIV 1&2 Ab, 4th Generation: NONREACTIVE

## 2015-10-10 LAB — COPPER, FREE: Copper - Free, Serum/Plasma: 530 mcg/L

## 2015-10-23 ENCOUNTER — Telehealth: Payer: BLUE CROSS/BLUE SHIELD | Admitting: Physician Assistant

## 2015-10-23 DIAGNOSIS — J019 Acute sinusitis, unspecified: Secondary | ICD-10-CM

## 2015-10-23 DIAGNOSIS — B9689 Other specified bacterial agents as the cause of diseases classified elsewhere: Secondary | ICD-10-CM

## 2015-10-23 MED ORDER — FLUCONAZOLE 150 MG PO TABS: 150.0000 mg | ORAL_TABLET | Freq: Once | ORAL | 0 refills | Status: AC

## 2015-10-23 MED ORDER — AMOXICILLIN-POT CLAVULANATE 875-125 MG PO TABS: 1.0000 | ORAL_TABLET | Freq: Two times a day (BID) | ORAL | 0 refills | Status: DC

## 2015-10-23 NOTE — Progress Notes (Signed)
We are sorry that you are not feeling well.  Here is how we plan to help!  Based on what you have shared with me it looks like you have sinusitis.  Sinusitis is inflammation and infection in the sinus cavities of the head.  Based on your presentation I believe you most likely have Acute Bacterial Sinusitis.  This is an infection caused by bacteria and is treated with antibiotics. I have prescribed Augmentin 875mg /125mg  one tablet twice daily with food, for 7 days. I have also sent in a Diflucan to help in case of yeast. You may use an oral decongestant such as Mucinex D or if you have glaucoma or high blood pressure use plain Mucinex. Saline nasal spray help and can safely be used as often as needed for congestion.  If you develop worsening sinus pain, fever or notice severe headache and vision changes, or if symptoms are not better after completion of antibiotic, please schedule an appointment with a health care provider.    Sinus infections are not as easily transmitted as other respiratory infection, however we still recommend that you avoid close contact with loved ones, especially the very young and elderly.  Remember to wash your hands thoroughly throughout the day as this is the number one way to prevent the spread of infection!  Home Care:  Only take medications as instructed by your medical team.  Complete the entire course of an antibiotic.  Do not take these medications with alcohol.  A steam or ultrasonic humidifier can help congestion.  You can place a towel over your head and breathe in the steam from hot water coming from a faucet.  Avoid close contacts especially the very young and the elderly.  Cover your mouth when you cough or sneeze.  Always remember to wash your hands.  Get Help Right Away If:  You develop worsening fever or sinus pain.  You develop a severe head ache or visual changes.  Your symptoms persist after you have completed your treatment plan.  Make sure  you  Understand these instructions.  Will watch your condition.  Will get help right away if you are not doing well or get worse.  Your e-visit answers were reviewed by a board certified advanced clinical practitioner to complete your personal care plan.  Depending on the condition, your plan could have included both over the counter or prescription medications.  If there is a problem please reply  once you have received a response from your provider.  Your safety is important to us.  If you have drug allergies check your prescription carefully.    You can use MyChart to ask questions about today's visit, request a non-urgent call back, or ask for a work or school excuse for 24 hours related to this e-Visit. If it has been greater than 24 hours you will need to follow up with your provider, or enter a new e-Visit to address those concerns.  You will get an e-mail in the next two days asking about your experience.  I hope that your e-visit has been valuable and will speed your recovery. Thank you for using e-visits.

## 2015-11-11 ENCOUNTER — Encounter: Payer: Self-pay | Admitting: Internal Medicine

## 2015-11-11 DIAGNOSIS — J3089 Other allergic rhinitis: Secondary | ICD-10-CM

## 2015-11-28 ENCOUNTER — Other Ambulatory Visit: Payer: Self-pay | Admitting: Internal Medicine

## 2016-01-05 ENCOUNTER — Ambulatory Visit (INDEPENDENT_AMBULATORY_CARE_PROVIDER_SITE_OTHER): Payer: BLUE CROSS/BLUE SHIELD | Admitting: Allergy and Immunology

## 2016-01-05 ENCOUNTER — Encounter: Payer: Self-pay | Admitting: Allergy and Immunology

## 2016-01-05 ENCOUNTER — Telehealth: Payer: Self-pay

## 2016-01-05 VITALS — BP 140/88 | HR 60 | Temp 97.8°F | Ht 64.0 in | Wt 350.4 lb

## 2016-01-05 DIAGNOSIS — J4540 Moderate persistent asthma, uncomplicated: Secondary | ICD-10-CM

## 2016-01-05 DIAGNOSIS — J320 Chronic maxillary sinusitis: Secondary | ICD-10-CM | POA: Diagnosis not present

## 2016-01-05 DIAGNOSIS — J3089 Other allergic rhinitis: Secondary | ICD-10-CM | POA: Diagnosis not present

## 2016-01-05 DIAGNOSIS — J329 Chronic sinusitis, unspecified: Secondary | ICD-10-CM | POA: Insufficient documentation

## 2016-01-05 DIAGNOSIS — I10 Essential (primary) hypertension: Secondary | ICD-10-CM

## 2016-01-05 MED ORDER — MONTELUKAST SODIUM 10 MG PO TABS: 10.0000 mg | ORAL_TABLET | Freq: Every day | ORAL | 5 refills | Status: DC

## 2016-01-05 MED ORDER — AZELASTINE-FLUTICASONE 137-50 MCG/ACT NA SUSP: 1.0000 | Freq: Two times a day (BID) | NASAL | 5 refills | Status: DC | PRN

## 2016-01-05 MED ORDER — BECLOMETHASONE DIPROPIONATE 80 MCG/ACT IN AERS: 2.0000 | INHALATION_SPRAY | Freq: Two times a day (BID) | RESPIRATORY_TRACT | 5 refills | Status: DC

## 2016-01-05 NOTE — Telephone Encounter (Signed)
Pt seen in our office today. Pt was prescribed Dymista. Received PA and processed on today.

## 2016-01-05 NOTE — Assessment & Plan Note (Addendum)
Today's spirometry results, assessed while asymptomatic, suggest under-perception of bronchoconstriction.  A prescription has been provided for Qvar (beclomethasone) 80 g, 2 inhalations twice a day. To maximize pulmonary deposition, a spacer has been provided along with instructions for its proper administration with an HFA inhaler.  Montelukast has been prescribed (as above).  Continue albuterol HFA, 1-2 inhalations every 4-6 hours as needed and 15 minutes prior to exercise/exertion.  Subjective and objective measures of pulmonary function will be followed and the treatment plan will be adjusted accordingly.

## 2016-01-05 NOTE — Assessment & Plan Note (Signed)
   Continue antihypertensives as prescribed.  The use of systemic decongestants such as pseudoephedrine and phenylephrine will be avoided.

## 2016-01-05 NOTE — Progress Notes (Signed)
New Patient Note  RE: Amy LatheMelissa I Townsend MRN: [id number] DOB: March 14, 1970 Date of Office Visit: 01/05/2016  Referring provider: Christia ReadingBates, Dwight, MD Primary care provider: Myrlene BrokerElizabeth A Crawford, MD  Chief Complaint: Sinus Problem; Nasal Congestion; and Asthma   History of present illness: Amy BaxterMelissa Townsend is a 45 y.o. female seen today in consultation requested by Christia Readingwight Bates, MD. She reports that over the past several years she has been experiencing frequent nasal congestion, rhinorrhea, postnasal drainage, sneezing, nasal pruritus, ocular pruritus, as well as sinus pressure over the cheek bones and between the eyes.  These symptoms occur year around without significant seasonal variation.  She has tried fluticasone nasal spray, cetirizine, loratadine, and fexofenadine without adequate symptom relief.  She states that she develops sinus infections  requiring antibiotics every 3 months on average.  She denies a history of recurrent lower respiratory infections, GI infections, skin infections, or genitourinary infections.  She had childhood asthma which seemed to improve through early adulthood, however over the past 5 years the lower respiratory symptoms have recurred and progressed.  Her asthma symptoms are triggered by chemicals, strong aromas, upper respiratory tract infections, and moderate exertion.  She takes albuterol as needed with rapid symptom relief.   Assessment and plan: Perennial and seasonal allergic rhinitis  Aeroallergen avoidance measures have been discussed and provided in written form.  A prescription has been provided for Dymista (azelastine/fluticasone) nasal spray, 1 spray per nostril twice daily as needed. Proper nasal spray technique has been discussed and demonstrated.  Nasal saline lavage (NeilMed) has been recommended prior to medicated nasal sprays and as needed along with instructions for proper administration.  A prescription has been provided for montelukast 10  mg daily at bedtime.  For thick post nasal drainage, add guaifenesin 1200 mg (Mucinex Maximum Strength)  twice daily as needed with adequate hydration as discussed.  The risks and benefits of aeroallergen immunotherapy have been discussed. The patient is motivated to initiate immunotherapy if insurance coverage is favorable. She will let us know how she would like to proceed.  Chronic sinusitis Most likely secondary to perennial and seasonal allergic rhinitis.   Treatment plan as outlined above.  If symptoms persist or progress despite treatment plan as outlined above, we will check appropriate labs to assess immunocompetence.  Moderate persistent asthma Today's spirometry results, assessed while asymptomatic, suggest under-perception of bronchoconstriction.  A prescription has been provided for Qvar (beclomethasone) 80 g,  2 inhalations twice a day. To maximize pulmonary deposition, a spacer has been provided along with instructions for its proper administration with an HFA inhaler.  Montelukast has been prescribed (as above).  Continue albuterol HFA, 1-2 inhalations every 4-6 hours as needed and 15 minutes prior to exercise/exertion.  Subjective and objective measures of pulmonary function will be followed and the treatment plan will be adjusted accordingly.  Essential hypertension  Continue antihypertensives as prescribed.  The use of systemic decongestants such as pseudoephedrine and phenylephrine will be avoided.   Meds ordered this encounter  Medications  . Azelastine-Fluticasone 137-50 MCG/ACT SUSP    Sig: Place 1 spray into the nose 2 (two) times daily as needed.    Dispense:  23 g    Refill:  5  . montelukast (SINGULAIR) 10 MG tablet    Sig: Take 1 tablet (10 mg total) by mouth at bedtime.    Dispense:  30 tablet    Refill:  5  . beclomethasone (QVAR) 80 MCG/ACT inhaler    Sig: Inhale 2 puffs into  the lungs 2 (two) times daily.    Dispense:  1 Inhaler    Refill:   5    Diagnostics: Spirometry: FVC was 2.83 L and FEV1 was 1.70 L (62% predicted) with significant (680 mL, 40%) postbronchodilator improvement.  The test was performed while the patient was asymptomatic.  Please see scanned spirometry results for details. Epicutaneous testing: Positive to grass pollens, cat hair, dog epithelia, mouse epithelia, and dust mite antigen. Intradermal testing: Positive to ragweed pollen, weed pollen, tree pollen, molds, and cockroach antigen.    Physical examination: Blood pressure 140/88, pulse 60, temperature 97.8 F (36.6 C), temperature source Oral, height 5\' 4"  (1.626 m), weight (!) 350 lb 6.4 oz (158.9 kg), SpO2 99 %.  General: Alert, interactive, in no acute distress. HEENT: TMs pearly gray, turbinates edematous and pale with thick discharge, post-pharynx erythematous. Neck: Supple without lymphadenopathy. Lungs: Mildly decreased breath sounds bilaterally without wheezing, rhonchi or rales. CV: Normal S1, S2 without murmurs. Abdomen: Nondistended, nontender. Skin: Warm and dry, without lesions or rashes. Extremities:  No clubbing, cyanosis or edema. Neuro:   Grossly intact.  Review of systems:  Review of systems negative except as noted in HPI / PMHx or noted below: Review of Systems  Constitutional: Negative.   HENT: Negative.   Eyes: Negative.   Respiratory: Negative.   Cardiovascular: Negative.   Gastrointestinal: Negative.   Genitourinary: Negative.   Musculoskeletal: Negative.   Skin: Negative.   Neurological: Negative.   Endo/Heme/Allergies: Negative.   Psychiatric/Behavioral: Negative.     Past medical history:  Past Medical History:  Diagnosis Date  . Anemia   . Asthma   . Bulging disc   . Hypertension   . Obesity   . PONV (postoperative nausea and vomiting)    PT IS [facility]    Past surgical history:  Past Surgical History:  Procedure Laterality Date  . CHOLECYSTECTOMY    . DILITATION & CURRETTAGE/HYSTROSCOPY  WITH NOVASURE ABLATION N/A 09/20/2013   Procedure: DILATATION & CURETTAGE/HYSTEROSCOPY WITH NOVASURE ABLATION;  Surgeon: Juluis Mire, MD;  Location: [facility];  Service: Gynecology;  Laterality: N/A;  . GASTRIC BYPASS    . LAPAROSCOPIC TUBAL LIGATION Bilateral 09/20/2013   Procedure: LAPAROSCOPIC TUBAL LIGATION;  Surgeon: Juluis Mire, MD;  Location: [facility];  Service: Gynecology;  Laterality: Bilateral;    Family history: Family History  Problem Relation Age of Onset  . Hypertension Sister     Social history: Social History   Social History  . Marital status: Divorced    Spouse name: N/A  . Number of children: N/A  . Years of education: N/A   Occupational History  . Not on file.   Social History Main Topics  . Smoking status: Never Smoker  . Smokeless tobacco: Never Used  . Alcohol use No  . Drug use: No  . Sexual activity: Not on file   Other Topics Concern  . Not on file   Social History Narrative  . No narrative on file   Environmental History: The patient lives in a 44 year old house with hardwood floors throughout, carpeting in the bedroom, gas heat, and central air.  There are 2 dogs in the home, the poodle has access to her bedroom.  She is a nonsmoker.    Medication List       Accurate as of 01/05/16 12:48 PM. Always use your most recent med list.          albuterol 108 (90 Base) MCG/ACT inhaler Commonly known  as:  PROVENTIL HFA;VENTOLIN HFA Inhale 1 puff into the lungs every 6 (six) hours as needed for wheezing or shortness of breath.   ALPRAZolam 0.25 MG tablet Commonly known as:  XANAX take 1 tablet by mouth once daily if needed for anxiety   Azelastine-Fluticasone 137-50 MCG/ACT Susp Place 1 spray into the nose 2 (two) times daily as needed.   beclomethasone 80 MCG/ACT inhaler Commonly known as:  QVAR Inhale 2 puffs into the lungs 2 (two) times daily.   buPROPion 150 MG 24 hr tablet Commonly known as:  WELLBUTRIN XL TAKE ONE TABLET BY MOUTH  ONCE DAILY   celecoxib 200 MG capsule Commonly known as:  CELEBREX Take 1 capsule (200 mg total) by mouth 2 (two) times daily.   cetirizine 10 MG tablet Commonly known as:  ZYRTEC Take 10 mg by mouth daily.   cyanocobalamin 1000 MCG/ML injection Commonly known as:  (VITAMIN B-12) INJECT 1 ML INTO THE MUSCLE EVERY 30 DAYS   diclofenac 1.3 % Ptch Commonly known as:  FLECTOR Place 1 patch onto the skin 2 (two) times daily.   fluticasone 50 MCG/ACT nasal spray Commonly known as:  FLONASE Place 1 spray into both nostrils daily.   hyoscyamine 0.125 MG SL tablet Commonly known as:  LEVSIN SL take 1 tablet by mouth every 4 hours if needed   metoprolol 50 MG tablet Commonly known as:  LOPRESSOR Take 0.5 tablets (25 mg total) by mouth 2 (two) times daily. Reported on 03/27/2015   montelukast 10 MG tablet Commonly known as:  SINGULAIR Take 1 tablet (10 mg total) by mouth at bedtime.   sodium chloride 0.65 % Soln nasal spray Commonly known as:  OCEAN Place 1 spray into both nostrils as needed for congestion.   traMADol 50 MG tablet Commonly known as:  ULTRAM TAKE ONE TABLET BY MOUTH AT BEDTIME AS NEEDED       Known medication allergies: No Known Allergies  I appreciate the opportunity to take part in Caty's care. Please do not hesitate to contact me with questions.  Sincerely,   R. Jorene Guestarter Hudson Majkowski, MD

## 2016-01-05 NOTE — Assessment & Plan Note (Addendum)
   Aeroallergen avoidance measures have been discussed and provided in written form.  A prescription has been provided for Dymista (azelastine/fluticasone) nasal spray, 1 spray per nostril twice daily as needed. Proper nasal spray technique has been discussed and demonstrated.  Nasal saline lavage (NeilMed) has been recommended prior to medicated nasal sprays and as needed along with instructions for proper administration.  A prescription has been provided for montelukast 10 mg daily at bedtime.  For thick post nasal drainage, add guaifenesin 1200 mg (Mucinex Maximum Strength)  twice daily as needed with adequate hydration as discussed.  The risks and benefits of aeroallergen immunotherapy have been discussed. The patient is motivated to initiate immunotherapy if insurance coverage is favorable. She will let us know how she would like to proceed.

## 2016-01-05 NOTE — Patient Instructions (Addendum)
Perennial and seasonal allergic rhinitis  Aeroallergen avoidance measures have been discussed and provided in written form.  A prescription has been provided for Dymista (azelastine/fluticasone) nasal spray, 1 spray per nostril twice daily as needed. Proper nasal spray technique has been discussed and demonstrated.  Nasal saline lavage (NeilMed) has been recommended prior to medicated nasal sprays and as needed along with instructions for proper administration.  A prescription has been provided for montelukast 10 mg daily at bedtime.  For thick post nasal drainage, add guaifenesin 1200 mg (Mucinex Maximum Strength)  twice daily as needed with adequate hydration as discussed.  The risks and benefits of aeroallergen immunotherapy have been discussed. The patient is motivated to initiate immunotherapy if insurance coverage is favorable. She will let us know how she would like to proceed.  Chronic sinusitis Most likely secondary to perennial and seasonal allergic rhinitis.   Treatment plan as outlined above.  If symptoms persist or progress despite treatment plan as outlined above, we will check appropriate labs to assess immunocompetence.  Moderate persistent asthma Today's spirometry results, assessed while asymptomatic, suggest under-perception of bronchoconstriction.  A prescription has been provided for Qvar (beclomethasone) 80 g,  2 inhalations twice a day. To maximize pulmonary deposition, a spacer has been provided along with instructions for its proper administration with an HFA inhaler.  Montelukast has been prescribed (as above).  Continue albuterol HFA, 1-2 inhalations every 4-6 hours as needed and 15 minutes prior to exercise/exertion.  Subjective and objective measures of pulmonary function will be followed and the treatment plan will be adjusted accordingly.  Essential hypertension  Continue antihypertensives as prescribed.  The use of systemic decongestants such as  pseudoephedrine and phenylephrine will be avoided.   Return in about 2 months (around 03/07/2016), or if symptoms worsen or fail to improve.  Reducing Pollen Exposure  The American Academy of Allergy, Asthma and Immunology suggests the following steps to reduce your exposure to pollen during allergy seasons.    1. Do not hang sheets or clothing out to dry; pollen may collect on these items. 2. Do not mow lawns or spend time around freshly cut grass; mowing stirs up pollen. 3. Keep windows closed at night.  Keep car windows closed while driving. 4. Minimize morning activities outdoors, a time when pollen counts are usually at their highest. 5. Stay indoors as much as possible when pollen counts or humidity is high and on windy days when pollen tends to remain in the air longer. 6. Use air conditioning when possible.  Many air conditioners have filters that trap the pollen spores. 7. Use a HEPA room air filter to remove pollen form the indoor air you breathe.   Control of House Dust Mite Allergen  House dust mites play a major role in allergic asthma and rhinitis.  They occur in environments with high humidity wherever human skin, the food for dust mites is found. High levels have been detected in dust obtained from mattresses, pillows, carpets, upholstered furniture, bed covers, clothes and soft toys.  The principal allergen of the house dust mite is found in its feces.  A gram of dust may contain 1,000 mites and 250,000 fecal particles.  Mite antigen is easily measured in the air during house cleaning activities.    1. Encase mattresses, including the box spring, and pillow, in an air tight cover.  Seal the zipper end of the encased mattresses with wide adhesive tape. 2. Wash the bedding in water of 130 degrees Farenheit weekly.  Avoid cotton comforters/quilts and flannel bedding: the most ideal bed covering is the dacron comforter. 3. Remove all upholstered furniture from the  bedroom. 4. Remove carpets, carpet padding, rugs, and non-washable window drapes from the bedroom.  Wash drapes weekly or use plastic window coverings. 5. Remove all non-washable stuffed toys from the bedroom.  Wash stuffed toys weekly. 6. Have the room cleaned frequently with a vacuum cleaner and a damp dust-mop.  The patient should not be in a room which is being cleaned and should wait 1 hour after cleaning before going into the room. 7. Close and seal all heating outlets in the bedroom.  Otherwise, the room will become filled with dust-laden air.  An electric heater can be used to heat the room. Reduce indoor humidity to less than 50%.  Do not use a humidifier.  Control of Dog or Cat Allergen  Avoidance is the best way to manage a dog or cat allergy. If you have a dog or cat and are allergic to dog or cats, consider removing the dog or cat from the home. If you have a dog or cat but don't want to find it a new home, or if your family wants a pet even though someone in the household is allergic, here are some strategies that may help keep symptoms at bay:  1. Keep the pet out of your bedroom and restrict it to only a few rooms. Be advised that keeping the dog or cat in only one room will not limit the allergens to that room. 2. Don't pet, hug or kiss the dog or cat; if you do, wash your hands with soap and water. 3. High-efficiency particulate air (HEPA) cleaners run continuously in a bedroom or living room can reduce allergen levels over time. 4. Regular use of a high-efficiency vacuum cleaner or a central vacuum can reduce allergen levels. 5. Giving your dog or cat a bath at least once a week can reduce airborne allergen.  Control of Mold Allergen  Mold and fungi can grow on a variety of surfaces provided certain temperature and moisture conditions exist.  Outdoor molds grow on plants, decaying vegetation and soil.  The major outdoor mold, Alternaria and Cladosporium, are found in very high  numbers during hot and dry conditions.  Generally, a late Summer - Fall peak is seen for common outdoor fungal spores.  Rain will temporarily lower outdoor mold spore count, but counts rise rapidly when the rainy period ends.  The most important indoor molds are Aspergillus and Penicillium.  Dark, humid and poorly ventilated basements are ideal sites for mold growth.  The next most common sites of mold growth are the bathroom and the kitchen.  Outdoor MicrosoftMold Control 5. Use air conditioning and keep windows closed 6. Avoid exposure to decaying vegetation. 7. Avoid leaf raking. 8. Avoid grain handling. 9. Consider wearing a face mask if working in moldy areas.  Indoor Mold Control 1. Maintain humidity below 50%. 2. Clean washable surfaces with 5% bleach solution. 3. Remove sources e.g. Contaminated carpets.  Control of Cockroach Allergen  Cockroach allergen has been identified as an important cause of acute attacks of asthma, especially in urban settings.  There are fifty-five species of cockroach that exist in the [place], however only three, the [place], [place] and Oriental species produce allergen that can affect patients with Asthma.  Allergens can be obtained from fecal particles, egg casings and secretions from cockroaches.    1. Remove food sources. 2. Reduce access  to water. 3. Seal access and entry points. 4. Spray runways with 0.5-1% Diazinon or Chlorpyrifos 5. Blow boric acid power under stoves and refrigerator. 6. Place bait stations (hydramethylnon) at feeding sites.

## 2016-01-05 NOTE — Assessment & Plan Note (Signed)
Most likely secondary to perennial and seasonal allergic rhinitis.   Treatment plan as outlined above.  If symptoms persist or progress despite treatment plan as outlined above, we will check appropriate labs to assess immunocompetence.

## 2016-02-05 ENCOUNTER — Other Ambulatory Visit: Payer: Self-pay | Admitting: Internal Medicine

## 2016-03-08 ENCOUNTER — Ambulatory Visit (INDEPENDENT_AMBULATORY_CARE_PROVIDER_SITE_OTHER): Payer: BLUE CROSS/BLUE SHIELD | Admitting: Allergy and Immunology

## 2016-03-08 ENCOUNTER — Encounter: Payer: Self-pay | Admitting: Allergy and Immunology

## 2016-03-08 VITALS — BP 152/90 | HR 61 | Temp 97.7°F | Resp 17

## 2016-03-08 DIAGNOSIS — J4540 Moderate persistent asthma, uncomplicated: Secondary | ICD-10-CM | POA: Diagnosis not present

## 2016-03-08 DIAGNOSIS — I10 Essential (primary) hypertension: Secondary | ICD-10-CM | POA: Diagnosis not present

## 2016-03-08 DIAGNOSIS — J3089 Other allergic rhinitis: Secondary | ICD-10-CM

## 2016-03-08 NOTE — Assessment & Plan Note (Signed)
Improved.  Continue appropriate allergen avoidance measures, Dymista nasal spray as needed, nasal saline irrigation as needed, montelukast (as above), and guaifenesin as needed.  If allergen avoidance measures and medications fail to adequately relieve symptoms, aeroallergen immunotherapy will be considered.

## 2016-03-08 NOTE — Patient Instructions (Addendum)
Moderate persistent asthma  Continue Qvar 80 g, 2 inhalations via spacer device twice a day, montelukast 10 mg daily bedtime, and albuterol HFA, 1-2 inhalations every 4-6 hours as needed.  Subjective and objective measures of pulmonary function will be followed and the treatment plan will be adjusted accordingly.  Perennial and seasonal allergic rhinitis Improved.  Continue appropriate allergen avoidance measures, Dymista nasal spray as needed, nasal saline irrigation as needed, montelukast (as above), and guaifenesin as needed.  If allergen avoidance measures and medications fail to adequately relieve symptoms, aeroallergen immunotherapy will be considered.  Essential hypertension  Continue antihypertensives as prescribed.  The use of systemic decongestants such as pseudoephedrine and phenylephrine will be avoided.   Return in about 4 months (around 07/06/2016), or if symptoms worsen or fail to improve.

## 2016-03-08 NOTE — Assessment & Plan Note (Signed)
   Continue Qvar 80 g, 2 inhalations via spacer device twice a day, montelukast 10 mg daily bedtime, and albuterol HFA, 1-2 inhalations every 4-6 hours as needed.  Subjective and objective measures of pulmonary function will be followed and the treatment plan will be adjusted accordingly.

## 2016-03-08 NOTE — Progress Notes (Signed)
Follow-up Note  RE: [name] MRN: [id number] DOB: January 10, 1971 Date of Office Visit: 03/08/2016  Primary care provider: Myrlene BrokerElizabeth A Crawford, MD Referring provider: Myrlene Brokerrawford, Elizabeth A, *  History of present illness: [name] is a 46 y.o. female with persistent asthma and allergic rhinoconjunctivitis presenting today for follow up.  She was last seen in this clinic in December 2017.  She reports that her nasal/sinus symptoms "have improved 120%" in the interval since her previous visit on the current treatment plan.  She also states that her asthma has been well controlled while taking Qvar 80 g, 2 inhalations via spacer device twice a day, and montelukast 10 mg daily bedtime.  She has only required albuterol rescue if she has missed doses of her controller medications.   Assessment and plan: Moderate persistent asthma  Continue Qvar 80 g, 2 inhalations via spacer device twice a day, montelukast 10 mg daily bedtime, and albuterol HFA, 1-2 inhalations every 4-6 hours as needed.  Subjective and objective measures of pulmonary function will be followed and the treatment plan will be adjusted accordingly.  Perennial and seasonal allergic rhinitis Improved.  Continue appropriate allergen avoidance measures, Dymista nasal spray as needed, nasal saline irrigation as needed, montelukast (as above), and guaifenesin as needed.  If allergen avoidance measures and medications fail to adequately relieve symptoms, aeroallergen immunotherapy will be considered.  Essential hypertension  Continue antihypertensives as prescribed.  The use of systemic decongestants such as pseudoephedrine and phenylephrine will be avoided.   Diagnostics: Spirometry reveals FVC of 3.29 L and an FEV1 of 1.63 L.  Moderate airways obstruction consistent with previous study.  Please see scanned spirometry results for details.    Physical examination: Blood pressure (!) 152/90, pulse 61,  temperature 97.7 F (36.5 C), temperature source Oral, resp. rate 17, SpO2 96 %.  General: Alert, interactive, in no acute distress. HEENT: TMs pearly gray, turbinates minimally edematous without discharge, post-pharynx unremarkable. Neck: Supple without lymphadenopathy. Lungs: Mildly decreased breath sounds bilaterally without wheezing, rhonchi or rales. CV: Normal S1, S2 without murmurs. Skin: Warm and dry, without lesions or rashes.  The following portions of the patient's history were reviewed and updated as appropriate: allergies, current medications, past family history, past medical history, past social history, past surgical history and problem list.  Allergies as of 03/08/2016   No Known Allergies     Medication List       Accurate as of 03/08/16 10:58 AM. Always use your most recent med list.          albuterol 108 (90 Base) MCG/ACT inhaler Commonly known as:  PROVENTIL HFA;VENTOLIN HFA Inhale 1 puff into the lungs every 6 (six) hours as needed for wheezing or shortness of breath.   ALPRAZolam 0.25 MG tablet Commonly known as:  XANAX take 1 tablet by mouth once daily if needed for anxiety   Azelastine-Fluticasone 137-50 MCG/ACT Susp Place 1 spray into the nose 2 (two) times daily as needed.   beclomethasone 80 MCG/ACT inhaler Commonly known as:  QVAR Inhale 2 puffs into the lungs 2 (two) times daily.   buPROPion 150 MG 24 hr tablet Commonly known as:  WELLBUTRIN XL TAKE ONE TABLET BY MOUTH ONCE DAILY   celecoxib 200 MG capsule Commonly known as:  CELEBREX Take 1 capsule (200 mg total) by mouth 2 (two) times daily.   cetirizine 10 MG tablet Commonly known as:  ZYRTEC Take 10 mg by mouth daily.   cyanocobalamin 1000 MCG/ML injection Commonly  known as:  (VITAMIN B-12) INJECT 1 ML INTO THE MUSCLE EVERY 30 DAYS   diclofenac 1.3 % Ptch Commonly known as:  FLECTOR Place 1 patch onto the skin 2 (two) times daily.   fluticasone 50 MCG/ACT nasal  spray Commonly known as:  FLONASE Place 1 spray into both nostrils daily.   hyoscyamine 0.125 MG SL tablet Commonly known as:  LEVSIN SL DISSOLVE ONE TABLET IN MOUTH EVERY 4 HOURS IF  NEEDED   metoprolol 50 MG tablet Commonly known as:  LOPRESSOR Take 0.5 tablets (25 mg total) by mouth 2 (two) times daily. Reported on 03/27/2015   montelukast 10 MG tablet Commonly known as:  SINGULAIR Take 1 tablet (10 mg total) by mouth at bedtime.   sodium chloride 0.65 % Soln nasal spray Commonly known as:  OCEAN Place 1 spray into both nostrils as needed for congestion.   traMADol 50 MG tablet Commonly known as:  ULTRAM TAKE ONE TABLET BY MOUTH AT BEDTIME AS NEEDED       No Known Allergies  I appreciate the opportunity to take part in [name]'s care. Please do not hesitate to contact me with questions.  Sincerely,   R. Jorene Guest, MD

## 2016-03-08 NOTE — Assessment & Plan Note (Signed)
   Continue antihypertensives as prescribed.  The use of systemic decongestants such as pseudoephedrine and phenylephrine will be avoided. 

## 2016-03-10 ENCOUNTER — Other Ambulatory Visit: Payer: Self-pay | Admitting: Internal Medicine

## 2016-03-30 ENCOUNTER — Ambulatory Visit (INDEPENDENT_AMBULATORY_CARE_PROVIDER_SITE_OTHER): Payer: BLUE CROSS/BLUE SHIELD | Admitting: Internal Medicine

## 2016-03-30 ENCOUNTER — Encounter: Payer: Self-pay | Admitting: Internal Medicine

## 2016-03-30 VITALS — BP 142/90 | HR 98 | Temp 98.2°F | Ht 64.0 in | Wt 348.0 lb

## 2016-03-30 DIAGNOSIS — L987 Excessive and redundant skin and subcutaneous tissue: Secondary | ICD-10-CM | POA: Insufficient documentation

## 2016-03-30 DIAGNOSIS — J4540 Moderate persistent asthma, uncomplicated: Secondary | ICD-10-CM

## 2016-03-30 NOTE — Assessment & Plan Note (Signed)
Referral to plastic surgery for consultation for skin removal. She does have recurrent infections although none presently.

## 2016-03-30 NOTE — Patient Instructions (Signed)
We will get you in with the plastic surgeon to talk to them about the options for the skin.

## 2016-03-30 NOTE — Assessment & Plan Note (Signed)
Doing better on the flonase, singulair, qvar and seeing asthma specialist to help get her under control. No flares this winter which is usually her worst time of year.

## 2016-03-30 NOTE — Progress Notes (Signed)
Pre visit review using our clinic review tool, if applicable. No additional management support is needed unless otherwise documented below in the visit note. 

## 2016-03-30 NOTE — Progress Notes (Signed)
   Subjective:    Patient ID: Beryle LatheMelissa I Crossland, female    DOB: [birth year], 46 y.o.   MRN: [id number]  HPI The patient is a 46 YO female coming in for follow up of her breathing. She is seeing allergy and ENT specialist now and they have adjusted her regimen. She is doing well this winter and usually this is her hardest time of year. She denies getting a bad cold or flu this year. She would also like to talk about her skin folds. She did lose a lot of weight after gastric bypass and has skin folds left on her stomach. She does get infections there from time to time. She has tried many things to help keep moisture out of there in the past with limited success. Summer is the worst for this.   Review of Systems  Constitutional: Negative.   HENT: Positive for congestion. Negative for ear discharge, ear pain, nosebleeds, postnasal drip, rhinorrhea, sneezing, sore throat and tinnitus.   Eyes: Negative.   Respiratory: Negative for cough, choking, chest tightness and shortness of breath.   Cardiovascular: Negative.   Gastrointestinal: Negative.   Musculoskeletal: Negative.   Skin: Negative.   Neurological: Negative.       Objective:   Physical Exam  Constitutional: She is oriented to person, place, and time. She appears well-developed and well-nourished.  Overweight  HENT:  Head: Normocephalic and atraumatic.  Eyes: EOM are normal.  Neck: Normal range of motion.  Cardiovascular: Normal rate and regular rhythm.   Pulmonary/Chest: Effort normal and breath sounds normal. No respiratory distress. She has no wheezes. She has no rales.  Abdominal: Soft. Bowel sounds are normal. She exhibits no distension. There is no tenderness. There is no rebound and no guarding.  Excessive skin folds  Neurological: She is alert and oriented to person, place, and time.  Skin: Skin is warm and dry.   Vitals:   03/30/16 0802  BP: (!) 142/90  Pulse: 98  Temp: 98.2 F (36.8 C)  TempSrc: Oral  SpO2: 99%    Weight: (!) 348 lb (157.9 kg)  Height: 5\' 4"  (1.626 m)      Assessment & Plan:

## 2016-05-17 ENCOUNTER — Encounter: Payer: Self-pay | Admitting: Internal Medicine

## 2016-05-27 ENCOUNTER — Encounter: Payer: Self-pay | Admitting: Internal Medicine

## 2016-05-29 NOTE — Progress Notes (Signed)
[facility] [address] [place], [place] [address] Phone: [phone] Subjective:    I'm seeing this patient by the request  of:    CC: knee pain  ONG:[id number]  [name] is a 46 y.o. female coming in with complaint of knee pain. Past medical history significant for chondral malacia. Patient states now right knee. Patient states that the pain seems to be somewhat severe. Patient was going down the stairs and had an auto pop and sensation. Since then having difficulty with range of motion. Feels that the knee is unstable. Unable to lead with this knee when going up or down stairs. Patient states that it is very uncomfortable at night as well. Rates the severity pain as 5 out of 10. Better with rest.      Past Medical History:  Diagnosis Date  . Anemia   . Asthma   . Bulging disc   . Hypertension   . Obesity   . PONV (postoperative nausea and vomiting)    PT IS [facility]   Past Surgical History:  Procedure Laterality Date  . CHOLECYSTECTOMY    . DILITATION & CURRETTAGE/HYSTROSCOPY WITH NOVASURE ABLATION N/A 09/20/2013   Procedure: DILATATION & CURETTAGE/HYSTEROSCOPY WITH NOVASURE ABLATION;  Surgeon: Juluis Mire, MD;  Location: [facility];  Service: Gynecology;  Laterality: N/A;  . GASTRIC BYPASS    . LAPAROSCOPIC TUBAL LIGATION Bilateral 09/20/2013   Procedure: LAPAROSCOPIC TUBAL LIGATION;  Surgeon: Juluis Mire, MD;  Location: [facility];  Service: Gynecology;  Laterality: Bilateral;   Social History   Social History  . Marital status: Divorced    Spouse name: N/A  . Number of children: N/A  . Years of education: N/A   Social History Main Topics  . Smoking status: Never Smoker  . Smokeless tobacco: Never Used  . Alcohol use No  . Drug use: No  . Sexual activity: Not Asked   Other Topics Concern  . None   Social History Narrative  . None   No Known Allergies Family History  Problem Relation Age of Onset  . Hypertension  Sister     Past medical history, social, surgical and family history all reviewed in electronic medical record.  No pertanent information unless stated regarding to the chief complaint.   Review of Systems:Review of systems updated and as accurate as of 05/31/16  No headache, visual changes, nausea, vomiting, diarrhea, constipation, dizziness, abdominal pain, skin rash, fevers, chills, night sweats, weight loss, swollen lymph nodes, body aches, joint swelling, chest pain, shortness of breath, mood changes. Positive muscle aches  Objective  Blood pressure 136/89, pulse 64, resp. rate 16, height 5\' 4"  (1.626 m), weight (!) 352 lb 4 oz (159.8 kg), SpO2 99 %. Systems examined below as of 05/31/16   General: No apparent distress alert and oriented x3 mood and affect normal, dressed appropriately.  HEENT: Pupils equal, extraocular movements intact  Respiratory: Patient's speak in full sentences and does not appear short of breath  Cardiovascular: No lower extremity edema, non tender, no erythema  Skin: Warm dry intact with no signs of infection or rash on extremities or on axial skeleton.  Abdomen: Soft nontender  Neuro: Cranial nerves II through XII are intact, neurovascularly intact in all extremities with 2+ DTRs and 2+ pulses.  Lymph: No lymphadenopathy of posterior or anterior cervical chain or axillae bilaterally.  Gait Antalgic gait MSK:  Non tender with full range of motion and good stability and symmetric strength  and tone of shoulders, elbows, wrist, hip, and ankles bilaterally.  Knee:Right valgus deformity noted. Large thigh to calf ratio.  Tender to palpation over medial and PF joint line.  ROM full in flexion and extension and lower leg rotation. instability with valgus force.  painful patellar compression. Patellar glide with moderate crepitus. Patellar and quadriceps tendons unremarkable. Hamstring and quadriceps strength is normal. Contralateral knee shows mild arthritic  changes but no pain   MSK US performed of: Right knee This study was ordered, performed, and interpreted by Terrilee FilesZach Smith D.O.  Knee: All structures visualized. Mild to moderate narrowing of the patellofemoral joint Patellar Tendon unremarkable on long and transverse views without effusion. No abnormality of prepatellar bursa. LCL and MCL unremarkable on long and transverse views. No abnormality of origin of medial or lateral head of the gastrocnemius.  IMPRESSION:  Patellofemoral arthritis     Procedure: Real-time Ultrasound Guided Injection of right knee Device: GE Logiq Q7 Ultrasound guided injection is preferred based studies that show increased duration, increased effect, greater accuracy, decreased procedural pain, increased response rate, and decreased cost with ultrasound guided versus blind injection.  Verbal informed consent obtained.  Time-out conducted.  Noted no overlying erythema, induration, or other signs of local infection.  Skin prepped in a sterile fashion.  Local anesthesia: Topical Ethyl chloride.  With sterile technique and under real time ultrasound guidance: With a 22-gauge 2 inch needle patient was injected with 4 cc of 0.5% Marcaine and 1 cc of Kenalog 40 mg/dL. This was from a superior lateral approach.  Completed without difficulty  Pain immediately resolved suggesting accurate placement of the medication.  Advised to call if fevers/chills, erythema, induration, drainage, or persistent bleeding.  Images permanently stored and available for review in the ultrasound unit.  Impression: Technically successful ultrasound guided injection. Impression and Recommendations:     This case required medical decision making of moderate complexity.      Note: This dictation was prepared with Dragon dictation along with smaller phrase technology. Any transcriptional errors that result from this process are unintentional.

## 2016-05-31 ENCOUNTER — Ambulatory Visit (INDEPENDENT_AMBULATORY_CARE_PROVIDER_SITE_OTHER): Payer: BLUE CROSS/BLUE SHIELD | Admitting: Family Medicine

## 2016-05-31 ENCOUNTER — Ambulatory Visit: Payer: Self-pay

## 2016-05-31 ENCOUNTER — Encounter: Payer: Self-pay | Admitting: Family Medicine

## 2016-05-31 VITALS — BP 136/89 | HR 64 | Resp 16 | Ht 64.0 in | Wt 352.2 lb

## 2016-05-31 DIAGNOSIS — M222X1 Patellofemoral disorders, right knee: Secondary | ICD-10-CM | POA: Insufficient documentation

## 2016-05-31 DIAGNOSIS — M25562 Pain in left knee: Secondary | ICD-10-CM | POA: Diagnosis not present

## 2016-05-31 MED ORDER — DICLOFENAC SODIUM 2 % TD SOLN: 2.0000 "application " | Freq: Two times a day (BID) | TRANSDERMAL | 3 refills | Status: DC

## 2016-05-31 NOTE — Assessment & Plan Note (Signed)
Patient is more of a patellofemoral arthritis. There was some minimal swelling. We discussed icing regimen, home exercises, which activities to do a which ones to avoid. Injection given today. Discussed potential custom bracing but patient declined today. Discussed which activities to do a which was to avoid. Follow-up again in 4-6 weeks.

## 2016-05-31 NOTE — Patient Instructions (Addendum)
Good to see you  We injected the knee today  Ice is your friend, Ice 20 minutes 2 times daily. Usually after activity and before bed. Stay active but not a lot of exercise for next 48 hours.  pennsaid pinkie amount topically 2 times daily as needed.  Vitamin D 2000 IU daily  See me agai nin 4 weeks.

## 2016-06-07 ENCOUNTER — Encounter: Payer: Self-pay | Admitting: Allergy and Immunology

## 2016-06-07 ENCOUNTER — Ambulatory Visit (INDEPENDENT_AMBULATORY_CARE_PROVIDER_SITE_OTHER): Payer: BLUE CROSS/BLUE SHIELD | Admitting: Allergy and Immunology

## 2016-06-07 VITALS — BP 128/70 | HR 72 | Resp 16

## 2016-06-07 DIAGNOSIS — J4540 Moderate persistent asthma, uncomplicated: Secondary | ICD-10-CM

## 2016-06-07 DIAGNOSIS — J3089 Other allergic rhinitis: Secondary | ICD-10-CM

## 2016-06-07 MED ORDER — FLUTICASONE PROPIONATE HFA 110 MCG/ACT IN AERO: 2.0000 | INHALATION_SPRAY | Freq: Two times a day (BID) | RESPIRATORY_TRACT | 5 refills | Status: DC

## 2016-06-07 NOTE — Assessment & Plan Note (Signed)
   For now, continue Qvar 80 g, 2 inhalations via spacer device twice a day, montelukast 10 mg daily bedtime, and albuterol HFA, 1-2 inhalations every 4-6 hours as needed.  During respiratory tract infections or asthma flares, add Qvar 80g 3 inhalations 3 times per day until symptoms have returned to baseline.  As the patient's insurance no longer covers Qvar, a prescription will be provided for Flovent 110 g. When she runs out of Qvar she will transition to Flovent with the same directions for use.  Subjective and objective measures of pulmonary function will be followed and the treatment plan will be adjusted accordingly.

## 2016-06-07 NOTE — Patient Instructions (Signed)
Moderate persistent asthma  For now, continue Qvar 80 g, 2 inhalations via spacer device twice a day, montelukast 10 mg daily bedtime, and albuterol HFA, 1-2 inhalations every 4-6 hours as needed.  During respiratory tract infections or asthma flares, add Qvar 80g 3 inhalations 3 times per day until symptoms have returned to baseline.  As the patient's insurance no longer covers Qvar, a prescription will be provided for Flovent 110 g. When she runs out of Qvar she will transition to Flovent with the same directions for use.  Subjective and objective measures of pulmonary function will be followed and the treatment plan will be adjusted accordingly.  Perennial and seasonal allergic rhinitis Stable.  Continue appropriate allergen avoidance measures, Dymista nasal spray as needed, nasal saline irrigation as needed, montelukast daily, and guaifenesin as needed.  If allergen avoidance measures and medications fail to adequately relieve symptoms, aeroallergen immunotherapy will be considered.   Return in about 4 months (around 10/08/2016), or if symptoms worsen or fail to improve.

## 2016-06-07 NOTE — Progress Notes (Signed)
Follow-up Note  RE: [name] MRN: [id number] DOB: 1970-03-24 Date of Office Visit: 06/07/2016  Primary care provider: Myrlene Broker, MD Referring provider: Myrlene Broker, *  History of present illness: [name] is a 46 y.o. female with persistent asthma allergic rhinoconjunctivitis presenting today for follow up.  She was last seen in this clinic on 03/08/2016.  She reports that her asthma and allergy symptoms have been well-controlled in the interval since the previous visit with the exception of 7-10 days in early April when she was exposed to elevated pollen counts.  She was encouraged however that she was able to make it through this episode without requiring antibiotics, which is typically the case.  She is currently taking Qvar 80 g, 2 inhalations via spacer device twice a day, montelukast 10 mg daily at bedtime, Dymista nasal spray as needed, and Mucinex if needed.   Assessment and plan: Moderate persistent asthma  For now, continue Qvar 80 g, 2 inhalations via spacer device twice a day, montelukast 10 mg daily bedtime, and albuterol HFA, 1-2 inhalations every 4-6 hours as needed.  During respiratory tract infections or asthma flares, add Qvar 80g 3 inhalations 3 times per day until symptoms have returned to baseline.  As the patient's insurance no longer covers Qvar, a prescription will be provided for Flovent 110 g. When she runs out of Qvar she will transition to Flovent with the same directions for use.  Subjective and objective measures of pulmonary function will be followed and the treatment plan will be adjusted accordingly.  Perennial and seasonal allergic rhinitis Stable.  Continue appropriate allergen avoidance measures, Dymista nasal spray as needed, nasal saline irrigation as needed, montelukast daily, and guaifenesin as needed.  If allergen avoidance measures and medications fail to adequately relieve symptoms, aeroallergen  immunotherapy will be considered.   Meds ordered this encounter  Medications  . fluticasone (FLOVENT HFA) 110 MCG/ACT inhaler    Sig: Inhale 2 puffs into the lungs 2 (two) times daily.    Dispense:  1 Inhaler    Refill:  5    Diagnostics: Spirometry:  Normal with an FEV1 of 93% predicted.  Please see scanned spirometry results for details.    Physical examination: Blood pressure 128/70, pulse 72, resp. rate 16.  General: Alert, interactive, in no acute distress. HEENT: TMs pearly gray, turbinates moderately edematous with thick discharge, post-pharynx mildly erythematous. Neck: Supple without lymphadenopathy. Lungs: Clear to auscultation without wheezing, rhonchi or rales. CV: Normal S1, S2 without murmurs. Skin: Warm and dry, without lesions or rashes.  The following portions of the patient's history were reviewed and updated as appropriate: allergies, current medications, past family history, past medical history, past social history, past surgical history and problem list.  Allergies as of 06/07/2016   No Known Allergies     Medication List       Accurate as of 06/07/16 11:01 AM. Always use your most recent med list.          albuterol 108 (90 Base) MCG/ACT inhaler Commonly known as:  PROVENTIL HFA;VENTOLIN HFA Inhale 1 puff into the lungs every 6 (six) hours as needed for wheezing or shortness of breath.   ALPRAZolam 0.25 MG tablet Commonly known as:  XANAX take 1 tablet by mouth once daily if needed for anxiety   Azelastine-Fluticasone 137-50 MCG/ACT Susp Place 1 spray into the nose 2 (two) times daily as needed.   beclomethasone 80 MCG/ACT inhaler Commonly known as:  QVAR Inhale  2 puffs into the lungs 2 (two) times daily.   buPROPion 150 MG 24 hr tablet Commonly known as:  WELLBUTRIN XL TAKE ONE TABLET BY MOUTH ONCE DAILY   cetirizine 10 MG tablet Commonly known as:  ZYRTEC Take 10 mg by mouth daily.   cyanocobalamin 1000 MCG/ML injection Commonly  known as:  (VITAMIN B-12) INJECT 1 ML INTO THE MUSCLE EVERY 30 DAYS   Diclofenac Sodium 2 % Soln Commonly known as:  PENNSAID Place 2 application onto the skin 2 (two) times daily.   fluticasone 110 MCG/ACT inhaler Commonly known as:  FLOVENT HFA Inhale 2 puffs into the lungs 2 (two) times daily.   fluticasone 50 MCG/ACT nasal spray Commonly known as:  FLONASE Place 1 spray into both nostrils daily.   hyoscyamine 0.125 MG SL tablet Commonly known as:  LEVSIN SL DISSOLVE ONE TABLET IN MOUTH EVERY 4 HOURS IF  NEEDED   meloxicam 7.5 MG tablet Commonly known as:  MOBIC Take 7.5 mg by mouth daily.   metoprolol 50 MG tablet Commonly known as:  LOPRESSOR TAKE ONE-HALF TABLET BY MOUTH TWICE DAILY   montelukast 10 MG tablet Commonly known as:  SINGULAIR Take 1 tablet (10 mg total) by mouth at bedtime.   sodium chloride 0.65 % Soln nasal spray Commonly known as:  OCEAN Place 1 spray into both nostrils as needed for congestion.   traMADol 50 MG tablet Commonly known as:  ULTRAM TAKE ONE TABLET BY MOUTH AT BEDTIME AS NEEDED       No Known Allergies  I appreciate the opportunity to take part in [name]'s care. Please do not hesitate to contact me with questions.  Sincerely,   R. Jorene Guestarter Eulanda Dorion, MD

## 2016-06-07 NOTE — Assessment & Plan Note (Signed)
Stable.  Continue appropriate allergen avoidance measures, Dymista nasal spray as needed, nasal saline irrigation as needed, montelukast daily, and guaifenesin as needed.  If allergen avoidance measures and medications fail to adequately relieve symptoms, aeroallergen immunotherapy will be considered.

## 2016-07-01 ENCOUNTER — Encounter: Payer: Self-pay | Admitting: Allergy and Immunology

## 2016-07-01 ENCOUNTER — Encounter: Payer: Self-pay | Admitting: Internal Medicine

## 2016-07-02 ENCOUNTER — Other Ambulatory Visit: Payer: Self-pay

## 2016-07-02 DIAGNOSIS — J4540 Moderate persistent asthma, uncomplicated: Secondary | ICD-10-CM

## 2016-07-02 MED ORDER — FLUTICASONE PROPIONATE HFA 110 MCG/ACT IN AERO: 2.0000 | INHALATION_SPRAY | Freq: Two times a day (BID) | RESPIRATORY_TRACT | 5 refills | Status: DC

## 2016-07-02 MED ORDER — ALPRAZOLAM 0.25 MG PO TABS: ORAL_TABLET | ORAL | 0 refills | Status: DC

## 2016-07-03 NOTE — Progress Notes (Signed)
[facility] [address] [place], [place] [address] Phone: [phone] Subjective:     CC: knee pain f/u   [id number]  [name] is a 46 y.o. female coming in with complaint of knee pain. Past medical history significant for chondral malacia. Patient was given an injection in the right knee last exam. Since then seems to be doing relatively better for 2 weeks and then the pain started worsening again. Patient's incision still having instability.    Past Medical History:  Diagnosis Date  . Anemia   . Asthma   . Bulging disc   . Hypertension   . Obesity   . PONV (postoperative nausea and vomiting)    PT IS [place] [facility]   Past Surgical History:  Procedure Laterality Date  . CHOLECYSTECTOMY    . DILITATION & CURRETTAGE/HYSTROSCOPY WITH NOVASURE ABLATION N/A 09/20/2013   Procedure: DILATATION & CURETTAGE/HYSTEROSCOPY WITH NOVASURE ABLATION;  Surgeon: Juluis Mire, MD;  Location: [facility];  Service: Gynecology;  Laterality: N/A;  . GASTRIC BYPASS    . LAPAROSCOPIC TUBAL LIGATION Bilateral 09/20/2013   Procedure: LAPAROSCOPIC TUBAL LIGATION;  Surgeon: Juluis Mire, MD;  Location: [facility];  Service: Gynecology;  Laterality: Bilateral;   Social History   Social History  . Marital status: Divorced    Spouse name: N/A  . Number of children: N/A  . Years of education: N/A   Social History Main Topics  . Smoking status: Never Smoker  . Smokeless tobacco: Never Used  . Alcohol use No  . Drug use: No  . Sexual activity: Not Asked   Other Topics Concern  . None   Social History Narrative  . None   No Known Allergies Family History  Problem Relation Age of Onset  . Hypertension Sister     Past medical history, social, surgical and family history all reviewed in electronic medical record.  No pertanent information unless stated regarding to the chief complaint.   Review of Systems: No headache, visual changes, nausea, vomiting,  diarrhea, constipation, dizziness, abdominal pain, skin rash, fevers, chills, night sweats, weight loss, swollen lymph nodes, body aches, joint swelling,  chest pain, shortness of breath, mood changes.  Positive muscle aches  Objective  Blood pressure 122/78, pulse (!) 58, height 5\' 4"  (1.626 m), weight (!) 353 lb (160.1 kg), SpO2 99 %. Systems examined below as of 07/05/16   Systems examined below as of 07/05/16 General: NAD A&O x3 mood, affect normal  HEENT: Pupils equal, extraocular movements intact no nystagmus Respiratory: not short of breath at rest or with speaking Cardiovascular: No lower extremity edema, non tender Skin: Warm dry intact with no signs of infection or rash on extremities or on axial skeleton. Abdomen: Soft nontender, no masses Neuro: Cranial nerves  intact, neurovascularly intact in all extremities with 2+ DTRs and 2+ pulses. Gait Antalgic gait MSK:  Non tender with full range of motion and good stability and symmetric strength and tone of shoulders, elbows, wrist, hip, and ankles bilaterally.  Knee: Right valgus deformity noted. Large thigh to calf ratio.  Tender to palpation over medial and PF joint line.  ROM full in flexion and extension and lower leg rotation. instability with valgus force.  painful patellar compression. Patellar glide with moderate crepitus. Patellar and quadriceps tendons unremarkable. Hamstring and quadriceps strength is normal. Contralateral knee shows mild arthritic changes but no pain No change from previous exam   Procedure: Real-time Ultrasound Guided Injection of right knee  Device: GE Logiq Q7 Ultrasound guided injection is preferred based studies that show increased duration, increased effect, greater accuracy, decreased procedural pain, increased response rate, and decreased cost with ultrasound guided versus blind injection.  Verbal informed consent obtained.  Time-out conducted.  Noted no overlying erythema, induration, or  other signs of local infection.  Skin prepped in a sterile fashion.  Local anesthesia: Topical Ethyl chloride.  With sterile technique and under real time ultrasound guidance: With a 22-gauge 2 inch needle patient was injected with 16 mg/2.5 mL of Synvisc (sodium hyaluronate) in a prefilled syringe was injected easily into the knee through a 22-gauge needle. This was from a superior lateral approach.  Completed without difficulty  Pain immediately resolved suggesting accurate placement of the medication.  Advised to call if fevers/chills, erythema, induration, drainage, or persistent bleeding.  Images permanently stored and available for review in the ultrasound unit.  Impression: Technically successful ultrasound guided injection. Impression and Recommendations:     This case required medical decision making of moderate complexity.      Note: This dictation was prepared with Dragon dictation along with smaller phrase technology. Any transcriptional errors that result from this process are unintentional.

## 2016-07-05 ENCOUNTER — Other Ambulatory Visit: Payer: Self-pay | Admitting: Internal Medicine

## 2016-07-05 ENCOUNTER — Encounter: Payer: Self-pay | Admitting: Family Medicine

## 2016-07-05 ENCOUNTER — Ambulatory Visit (INDEPENDENT_AMBULATORY_CARE_PROVIDER_SITE_OTHER): Payer: BLUE CROSS/BLUE SHIELD | Admitting: Family Medicine

## 2016-07-05 DIAGNOSIS — M1711 Unilateral primary osteoarthritis, right knee: Secondary | ICD-10-CM | POA: Diagnosis not present

## 2016-07-05 NOTE — Assessment & Plan Note (Signed)
Patient is failed all conservative therapy. Patient continues to have pain with instability. Patient has elected to try viscous supplementation. Discuss potential side effects. Patient was given first injection today. Follow-up in one week for second in a series of 3 injections. Continue conservative therapy at this time.

## 2016-07-05 NOTE — Telephone Encounter (Signed)
Faxed script to [facility].../lmb 

## 2016-07-05 NOTE — Patient Instructions (Signed)
Good to see you  We started synvisc.  Ice is your friend.  Stay active.  We will see you again 1 time a week for another 2 weeks.  Will take some time to work.  See you Soon!

## 2016-07-05 NOTE — Telephone Encounter (Deleted)
Faxed script to [facility]

## 2016-07-12 ENCOUNTER — Ambulatory Visit (INDEPENDENT_AMBULATORY_CARE_PROVIDER_SITE_OTHER): Payer: BLUE CROSS/BLUE SHIELD | Admitting: Family Medicine

## 2016-07-12 ENCOUNTER — Encounter: Payer: Self-pay | Admitting: Family Medicine

## 2016-07-12 ENCOUNTER — Ambulatory Visit: Payer: Self-pay

## 2016-07-12 VITALS — BP 142/88 | HR 64 | Ht 64.0 in | Wt 358.0 lb

## 2016-07-12 DIAGNOSIS — M25561 Pain in right knee: Secondary | ICD-10-CM

## 2016-07-12 DIAGNOSIS — M1711 Unilateral primary osteoarthritis, right knee: Secondary | ICD-10-CM

## 2016-07-12 NOTE — Patient Instructions (Signed)
Good to see you  Ice is your friend.  See you again next week   

## 2016-07-12 NOTE — Progress Notes (Signed)
Tawana ScaleZach Veda Arrellano D.O. [facility] [address] [place], [place] [address] Phone: [phone] Subjective:     CC: knee pain f/u   MWN:[id number]:Subjective  [name] is a 46 y.o. female coming in with complaint of knee pain. Past medical history significant for chondral malacia And arthritis of the knee. Patient failed all conservative therapy and is here for second Synvisc injection. Patient states far minimal change.   Past Medical History:  Diagnosis Date  . Anemia   . Asthma   . Bulging disc   . Hypertension   . Obesity   . PONV (postoperative nausea and vomiting)    PT IS [place]   Past Surgical History:  Procedure Laterality Date  . CHOLECYSTECTOMY    . DILITATION & CURRETTAGE/HYSTROSCOPY WITH NOVASURE ABLATION N/A 09/20/2013   Procedure: DILATATION & CURETTAGE/HYSTEROSCOPY WITH NOVASURE ABLATION;  Surgeon: Juluis MireJohn S McComb, MD;  Location: [facility];  Service: Gynecology;  Laterality: N/A;  . GASTRIC BYPASS    . LAPAROSCOPIC TUBAL LIGATION Bilateral 09/20/2013   Procedure: LAPAROSCOPIC TUBAL LIGATION;  Surgeon: Juluis MireJohn S McComb, MD;  Location: [facility];  Service: Gynecology;  Laterality: Bilateral;   Social History   Social History  . Marital status: Divorced    Spouse name: N/A  . Number of children: N/A  . Years of education: N/A   Social History Main Topics  . Smoking status: Never Smoker  . Smokeless tobacco: Never Used  . Alcohol use No  . Drug use: No  . Sexual activity: Not on file   Other Topics Concern  . Not on file   Social History Narrative  . No narrative on file   No Known Allergies Family History  Problem Relation Age of Onset  . Hypertension Sister     Past medical history, social, surgical and family history all reviewed in electronic medical record.  No pertanent information unless stated regarding to the chief complaint.   Review of Systems: No headache, visual changes, nausea, vomiting, diarrhea, constipation, dizziness, abdominal  pain, skin rash, fevers, chills, night sweats, weight loss, swollen lymph nodes, body aches, joint swelling,  chest pain, shortness of breath, mood changes.  Positive muscle aches  Objective  There were no vitals taken for this visit.   Systems examined below as of 07/12/16 General: NAD A&O x3 mood, affect normal  HEENT: Pupils equal, extraocular movements intact no nystagmus Respiratory: not short of breath at rest or with speaking Cardiovascular: No lower extremity edema, non tender Skin: Warm dry intact with no signs of infection or rash on extremities or on axial skeleton. Abdomen: Soft nontender, no masses Neuro: Cranial nerves  intact, neurovascularly intact in all extremities with 2+ DTRs and 2+ pulses. Lymph: No lymphadenopathy appreciated today  Gait Antalgic gait MSK:  Non tender with full range of motion and good stability and symmetric strength and tone of shoulders, elbows, wrist, hip, and ankles bilaterally.  Knee: Right valgus deformity noted. Large thigh to calf ratio.  Tender to palpation over medial and PF joint line.  ROM full in flexion and extension and lower leg rotation. instability with valgus force.  painful patellar compression. Patellar glide with moderate crepitus. Patellar and quadriceps tendons unremarkable. Hamstring and quadriceps strength is normal. Contralateral knee shows   Procedure: Real-time Ultrasound Guided Injection of right knee Device: GE Logiq Q7 Ultrasound guided injection is preferred based studies that show increased duration, increased effect, greater accuracy, decreased procedural pain, increased response rate, and decreased cost with ultrasound  guided versus blind injection.  Verbal informed consent obtained.  Time-out conducted.  Noted no overlying erythema, induration, or other signs of local infection.  Skin prepped in a sterile fashion.  Local anesthesia: Topical Ethyl chloride.  With sterile technique and under real time  ultrasound guidance: With a 22-gauge 2 inch needle patient was injected with 16 mg/2.5 mL of Synvisc (sodium hyaluronate) in a prefilled syringe was injected easily into the knee through a 22-gauge needle. This was from a superior lateral approach.  Completed without difficulty  Pain immediately resolved suggesting accurate placement of the medication.  Advised to call if fevers/chills, erythema, induration, drainage, or persistent bleeding.  Images permanently stored and available for review in the ultrasound unit.  Impression: Technically successful ultrasound guided injection.  Impression and Recommendations:     This case required medical decision making of moderate complexity.      Note: This dictation was prepared with Dragon dictation along with smaller phrase technology. Any transcriptional errors that result from this process are unintentional.

## 2016-07-12 NOTE — Assessment & Plan Note (Signed)
Second in a series of 3 injections given. Patient has tolerated the procedure well. We discussed continuing conservative therapy. Follow-up again in 1 week for third and final injection.

## 2016-07-13 ENCOUNTER — Ambulatory Visit (INDEPENDENT_AMBULATORY_CARE_PROVIDER_SITE_OTHER): Payer: BLUE CROSS/BLUE SHIELD | Admitting: Nurse Practitioner

## 2016-07-13 ENCOUNTER — Other Ambulatory Visit: Payer: BLUE CROSS/BLUE SHIELD

## 2016-07-13 ENCOUNTER — Encounter: Payer: Self-pay | Admitting: Nurse Practitioner

## 2016-07-13 VITALS — BP 120/78 | HR 56 | Temp 98.7°F | Ht 64.0 in | Wt 357.0 lb

## 2016-07-13 DIAGNOSIS — R300 Dysuria: Secondary | ICD-10-CM | POA: Diagnosis not present

## 2016-07-13 DIAGNOSIS — N3000 Acute cystitis without hematuria: Secondary | ICD-10-CM

## 2016-07-13 LAB — POCT URINALYSIS DIPSTICK
BILIRUBIN UA: NEGATIVE
Blood, UA: NEGATIVE
GLUCOSE UA: NEGATIVE
Ketones, UA: NEGATIVE
LEUKOCYTES UA: NEGATIVE
NITRITE UA: NEGATIVE
Protein, UA: NEGATIVE
Spec Grav, UA: 1.02 (ref 1.010–1.025)
Urobilinogen, UA: 1 E.U./dL
pH, UA: 6 (ref 5.0–8.0)

## 2016-07-13 MED ORDER — PHENAZOPYRIDINE HCL 100 MG PO TABS: 100.0000 mg | ORAL_TABLET | Freq: Three times a day (TID) | ORAL | 0 refills | Status: DC | PRN

## 2016-07-13 NOTE — Patient Instructions (Signed)
Encourage adequate oral hydration.  You will be contacted about urine culture results.  Ok to hold oxybutin for 2days to see if symptoms improve.

## 2016-07-13 NOTE — Progress Notes (Signed)
Subjective:  Patient ID: [name], female    DOB: December 22, 1970  Age: 46 y.o. MRN: [id number]  CC: Urinary Tract Infection (sharp pain on bladder area,little odor going on for 5 days. )   Urinary Tract Infection   This is a new problem. The current episode started in the past 7 days. The problem occurs every urination. The problem has been unchanged. Quality: sharp pain with urination. There has been no fever. She is sexually active. There is no history of pyelonephritis. Associated symptoms include hesitancy. Pertinent negatives include no chills, discharge, flank pain, frequency, hematuria, nausea, possible pregnancy, sweats, urgency or vomiting. She has tried nothing for the symptoms. Her past medical history is significant for urinary stasis. There is no history of catheterization, kidney stones, recurrent UTIs, a single kidney or a urological procedure.  denies any perineal rash or ulcers.  Outpatient Medications Prior to Visit  Medication Sig Dispense Refill  . albuterol (PROVENTIL HFA;VENTOLIN HFA) 108 (90 Base) MCG/ACT inhaler Inhale 1 puff into the lungs every 6 (six) hours as needed for wheezing or shortness of breath. 1 Inhaler 0  . ALPRAZolam (XANAX) 0.25 MG tablet take 1 tablet by mouth once daily if needed for anxiety 30 tablet 0  . Azelastine-Fluticasone 137-50 MCG/ACT SUSP Place 1 spray into the nose 2 (two) times daily as needed. 23 g 5  . beclomethasone (QVAR) 80 MCG/ACT inhaler Inhale 2 puffs into the lungs 2 (two) times daily. 1 Inhaler 5  . buPROPion (WELLBUTRIN XL) 150 MG 24 hr tablet TAKE ONE TABLET BY MOUTH ONCE DAILY 90 tablet 1  . cetirizine (ZYRTEC) 10 MG tablet Take 10 mg by mouth daily.    . cyanocobalamin (,VITAMIN B-12,) 1000 MCG/ML injection INJECT 1 ML INTO THE MUSCLE EVERY 30 DAYS 1 mL 11  . Diclofenac Sodium (PENNSAID) 2 % SOLN Place 2 application onto the skin 2 (two) times daily. 112 g 3  . fluticasone (FLONASE) 50 MCG/ACT nasal spray Place 1 spray  into both nostrils daily. 16 g 0  . fluticasone (FLOVENT HFA) 110 MCG/ACT inhaler Inhale 2 puffs into the lungs 2 (two) times daily. 1 Inhaler 5  . hyoscyamine (LEVSIN SL) 0.125 MG SL tablet DISSOLVE ONE TABLET IN MOUTH EVERY 4 HOURS IF  NEEDED 30 tablet 2  . meloxicam (MOBIC) 7.5 MG tablet Take 7.5 mg by mouth daily.    . metoprolol (LOPRESSOR) 50 MG tablet TAKE ONE-HALF TABLET BY MOUTH TWICE DAILY 30 tablet 3  . montelukast (SINGULAIR) 10 MG tablet Take 1 tablet (10 mg total) by mouth at bedtime. 30 tablet 5  . sodium chloride (OCEAN) 0.65 % SOLN nasal spray Place 1 spray into both nostrils as needed for congestion. 480 mL 0  . traMADol (ULTRAM) 50 MG tablet TAKE ONE TABLET BY MOUTH AT BEDTIME AS NEEDED 30 tablet 3   No facility-administered medications prior to visit.     ROS See HPI  Objective:  BP 120/78   Pulse (!) 56   Temp 98.7 F (37.1 C)   Ht 5\' 4"  (1.626 m)   Wt (!) 357 lb (161.9 kg)   SpO2 96%   BMI 61.28 kg/m   BP Readings from Last 3 Encounters:  07/13/16 120/78  07/12/16 (!) 142/88  07/05/16 122/78    Wt Readings from Last 3 Encounters:  07/13/16 (!) 357 lb (161.9 kg)  07/12/16 (!) 358 lb (162.4 kg)  07/05/16 (!) 353 lb (160.1 kg)    Physical Exam  Constitutional: She is  oriented to person, place, and time.  Cardiovascular: Normal rate.   Pulmonary/Chest: Effort normal.  Abdominal: Soft. Bowel sounds are normal. She exhibits no distension. There is no tenderness.  Neurological: She is alert and oriented to person, place, and time.  Skin: Skin is warm and dry.  Vitals reviewed.   Lab Results  Component Value Date   WBC 5.8 03/27/2015   HGB 11.5 (L) 03/27/2015   HCT 36.3 03/27/2015   PLT 250.0 03/27/2015   GLUCOSE 83 09/30/2015   CHOL 124 09/30/2015   TRIG 48.0 09/30/2015   HDL 53.80 09/30/2015   LDLCALC 60 09/30/2015   ALT 10 09/30/2015   AST 15 09/30/2015   NA 139 09/30/2015   K 3.6 09/30/2015   CL 107 09/30/2015   CREATININE 0.64  09/30/2015   BUN 11 09/30/2015   CO2 28 09/30/2015   TSH 2.10 09/30/2015   HGBA1C 5.8 03/05/2014    No results found.  Assessment & Plan:   [name] was seen today for urinary tract infection.  Diagnoses and all orders for this visit:  Dysuria -     POCT urinalysis dipstick -     CULTURE, URINE COMPREHENSIVE; Future -     phenazopyridine (PYRIDIUM) 100 MG tablet; Take 1 tablet (100 mg total) by mouth 3 (three) times daily as needed for pain (with food).   I am having [name] start on phenazopyridine. I am also having her maintain her cetirizine, albuterol, fluticasone, sodium chloride, cyanocobalamin, Azelastine-Fluticasone, montelukast, beclomethasone, traMADol, hyoscyamine, metoprolol tartrate, Diclofenac Sodium, meloxicam, fluticasone, ALPRAZolam, buPROPion, and oxybutynin.  Meds ordered this encounter  Medications  . oxybutynin (DITROPAN-XL) 10 MG 24 hr tablet    Sig: Take 10 mg by mouth at bedtime.  . phenazopyridine (PYRIDIUM) 100 MG tablet    Sig: Take 1 tablet (100 mg total) by mouth 3 (three) times daily as needed for pain (with food).    Dispense:  10 tablet    Refill:  0    Order Specific Question:   Supervising Provider    Answer:   [name] [[id number]]    Follow-up: Return if symptoms worsen or fail to improve.  Amy Penna, NP

## 2016-07-15 LAB — CULTURE, URINE COMPREHENSIVE

## 2016-07-16 ENCOUNTER — Telehealth: Payer: Self-pay | Admitting: Nurse Practitioner

## 2016-07-16 ENCOUNTER — Encounter: Payer: Self-pay | Admitting: Nurse Practitioner

## 2016-07-16 DIAGNOSIS — N3000 Acute cystitis without hematuria: Secondary | ICD-10-CM

## 2016-07-16 MED ORDER — NITROFURANTOIN MONOHYD MACRO 100 MG PO CAPS: 100.0000 mg | ORAL_CAPSULE | Freq: Two times a day (BID) | ORAL | 0 refills | Status: DC

## 2016-07-16 MED ORDER — NITROFURANTOIN MONOHYD MACRO 100 MG PO CAPS: 100.0000 mg | ORAL_CAPSULE | Freq: Two times a day (BID) | ORAL | 0 refills | Status: AC

## 2016-07-16 NOTE — Telephone Encounter (Signed)
Pt request this med send to [facility] and [facility].

## 2016-07-19 ENCOUNTER — Encounter: Payer: Self-pay | Admitting: Family Medicine

## 2016-07-19 ENCOUNTER — Ambulatory Visit (INDEPENDENT_AMBULATORY_CARE_PROVIDER_SITE_OTHER): Payer: BLUE CROSS/BLUE SHIELD | Admitting: Family Medicine

## 2016-07-19 DIAGNOSIS — M1711 Unilateral primary osteoarthritis, right knee: Secondary | ICD-10-CM

## 2016-07-19 MED ORDER — FLUCONAZOLE 150 MG PO TABS: 150.0000 mg | ORAL_TABLET | Freq: Once | ORAL | 0 refills | Status: AC

## 2016-07-19 NOTE — Progress Notes (Signed)
[name] D.O. [facility] [address] [place], [place] [address] Phone: [phone] Subjective:     CC: knee pain f/u   BMW:[id number]:Subjective  [name] is a 46 y.o. female coming in with complaint of knee pain. Past medical history significant for chondral malacia And arthritis of the knee. Patient failed all conservative therapy and is here for Third and final Synvisc injection. States that she is feeling better. States that it is 20-30% better. Happy with the results of far.   Past Medical History:  Diagnosis Date  . Anemia   . Asthma   . Bulging disc   . Hypertension   . Obesity   . PONV (postoperative nausea and vomiting)    PT IS [place]   Past Surgical History:  Procedure Laterality Date  . CHOLECYSTECTOMY    . DILITATION & CURRETTAGE/HYSTROSCOPY WITH NOVASURE ABLATION N/A 09/20/2013   Procedure: DILATATION & CURETTAGE/HYSTEROSCOPY WITH NOVASURE ABLATION;  Surgeon: Juluis MireJohn S McComb, MD;  Location: [facility];  Service: Gynecology;  Laterality: N/A;  . GASTRIC BYPASS    . LAPAROSCOPIC TUBAL LIGATION Bilateral 09/20/2013   Procedure: LAPAROSCOPIC TUBAL LIGATION;  Surgeon: Juluis MireJohn S McComb, MD;  Location: [facility];  Service: Gynecology;  Laterality: Bilateral;   Social History   Social History  . Marital status: Divorced    Spouse name: N/A  . Number of children: N/A  . Years of education: N/A   Social History Main Topics  . Smoking status: Never Smoker  . Smokeless tobacco: Never Used  . Alcohol use No  . Drug use: No  . Sexual activity: Not Asked   Other Topics Concern  . None   Social History Narrative  . None   No Known Allergies Family History  Problem Relation Age of Onset  . Hypertension Sister     Past medical history, social, surgical and family history all reviewed in electronic medical record.  No pertanent information unless stated regarding to the chief complaint.   Review of Systems: No headache, visual changes, nausea,  vomiting, diarrhea, constipation, dizziness, abdominal pain, skin rash, fevers, chills, night sweats, weight loss, swollen lymph nodes, body aches, joint swelling, muscle aches, chest pain, shortness of breath, mood changes.    Objective  Blood pressure 140/86, pulse 62, height 5\' 5"  (1.651 m), weight (!) 358 lb (162.4 kg).   Systems examined below as of 07/19/16 General: NAD A&O x3 mood, affect normal morbidly obese.  HEENT: Pupils equal, extraocular movements intact no nystagmus Respiratory: not short of breath at rest or with speaking Cardiovascular: No lower extremity edema, non tender Skin: Warm dry intact with no signs of infection or rash on extremities or on axial skeleton. Abdomen: Soft nontender, no masses Neuro: Cranial nerves  intact, neurovascularly intact in all extremities with 2+ DTRs and 2+ pulses. Lymph: No lymphadenopathy appreciated today  Gait normal with good balance and coordination.  MSK: Non tender with full range of motion and good stability and symmetric strength and tone of shoulders, elbows, wrist, hips and ankles bilaterally.   Knee: Right valgus deformity noted. Large thigh to calf ratio.  Tender to palpation over medial and PF joint line.  ROM full in flexion and extension and lower leg rotation. instability with valgus force.  painful patellar compression. Patellar glide with moderate crepitus. Patellar and quadriceps tendons unremarkable. Hamstring and quadriceps strength is normal. Contralateral knee shows arthritis as well.   After informed written and verbal consent, patient was seated on exam table.  Right knee was prepped with alcohol swab and utilizing anterolateral approach, patient's right knee space was injected with16 mg/2.5 mL of Synvisc (sodium hyaluronate) in a prefilled syringe was injected easily into the knee through a 22-gauge needle..Patient tolerated the procedure well without immediate complications.  Impression and Recommendations:       This case required medical decision making of moderate complexity.      Note: This dictation was prepared with Dragon dictation along with smaller phrase technology. Any transcriptional errors that result from this process are unintentional.

## 2016-07-19 NOTE — Patient Instructions (Signed)
God to see you  [name] is your friend.  Stay active.  You get a break from me See me again in 4 weeks!

## 2016-07-19 NOTE — Assessment & Plan Note (Signed)
Responded well to the Synvisc injection Done with series Continue current conservative  Follow-up in 4 weeks

## 2016-07-26 ENCOUNTER — Encounter: Payer: Self-pay | Admitting: Internal Medicine

## 2016-08-24 ENCOUNTER — Encounter: Payer: Self-pay | Admitting: Family Medicine

## 2016-08-24 ENCOUNTER — Ambulatory Visit (INDEPENDENT_AMBULATORY_CARE_PROVIDER_SITE_OTHER): Payer: BLUE CROSS/BLUE SHIELD | Admitting: Family Medicine

## 2016-08-24 DIAGNOSIS — M1711 Unilateral primary osteoarthritis, right knee: Secondary | ICD-10-CM | POA: Diagnosis not present

## 2016-08-24 NOTE — Progress Notes (Signed)
[name] D.O. [name] [address] [place], [place] [address] Phone: [phone] Subjective:     CC: knee pain f/u   BJY:[id number]:Subjective  Amy I [name] is a 46 y.o. female coming in with complaint of knee pain. Past medical history significant for chondral malacia And arthritis of the knee. Failed all conservative therapy in one month ago was finishing with viscous supplementation. Patient has made significant progress at this time but is still having some pain after going on vacation. Patient has noticed some mild pain medially.   Past Medical History:  Diagnosis Date  . Anemia   . Asthma   . Bulging disc   . Hypertension   . Obesity   . PONV (postoperative nausea and vomiting)    PT IS [place] IN[place]   Past Surgical History:  Procedure Laterality Date  . CHOLECYSTECTOMY    . DILITATION & CURRETTAGE/HYSTROSCOPY WITH NOVASURE ABLATION N/A 09/20/2013   Procedure: DILATATION & CURETTAGE/HYSTEROSCOPY WITH NOVASURE ABLATION;  Surgeon: Juluis MireJohn S McComb, MD;  Location: [facility];  Service: Gynecology;  Laterality: N/A;  . GASTRIC BYPASS    . LAPAROSCOPIC TUBAL LIGATION Bilateral 09/20/2013   Procedure: LAPAROSCOPIC TUBAL LIGATION;  Surgeon: Juluis MireJohn S McComb, MD;  Location: [facility];  Service: Gynecology;  Laterality: Bilateral;   Social History   Social History  . Marital status: Divorced    Spouse name: N/A  . Number of children: N/A  . Years of education: N/A   Social History Main Topics  . Smoking status: Never Smoker  . Smokeless tobacco: Never Used  . Alcohol use No  . Drug use: No  . Sexual activity: Not Asked   Other Topics Concern  . None   Social History Narrative  . None   No Known Allergies Family History  Problem Relation Age of Onset  . Hypertension Sister     Past medical history, social, surgical and family history all reviewed in electronic medical record.  No pertanent information unless stated regarding to the chief complaint.   Review of  Systems: No headache, visual changes, nausea, vomiting, diarrhea, constipation, dizziness, abdominal pain, skin rash, fevers, chills, night sweats, weight loss, swollen lymph nodes, body aches, joint swelling, muscle aches, chest pain, shortness of breath, mood changes.    Objective  Blood pressure 132/84, pulse 63, height 5\' 5"  (1.651 m), weight (!) 358 lb (162.4 kg), SpO2 99 %.   Systems examined below as of 08/24/16 General: NAD A&O x3 mood, affect normal morbidly obese.  HEENT: Pupils equal, extraocular movements intact no nystagmus Respiratory: not short of breath at rest or with speaking Cardiovascular: No lower extremity edema, non tender Skin: Warm dry intact with no signs of infection or rash on extremities or on axial skeleton. Abdomen: Soft nontender, no masses Neuro: Cranial nerves  intact, neurovascularly intact in all extremities with 2+ DTRs and 2+ pulses. Lymph: No lymphadenopathy appreciated today  GaitAntalgic gait MSK: Non tender with full range of motion and good stability and symmetric strength and tone of shoulders, elbows, wrist, hips and ankles bilaterally.   Knee: Right valgus deformity noted.  Tender to palpation over medial medial joint line ROM full in flexion and extension and lower leg rotation. instability with valgus force.  painful patellar compression. Patellar glide with moderate crepitus. Patellar and quadriceps tendons unremarkable. Hamstring and quadriceps strength is normal. Contralateral knee shows arthritis as well. But nontender     Impression and Recommendations:     This case required medical decision  making of moderate complexity.      Note: This dictation was prepared with Dragon dictation along with smaller phrase technology. Any transcriptional errors that result from this process are unintentional.

## 2016-08-24 NOTE — Assessment & Plan Note (Signed)
Mild exacerbation at this time. Continue conservative therapy. We discussed icing regimen. Patient will do meloxicam regularly. Patient come back and see me again in 4 weeks for further evaluation and treatment.

## 2016-08-24 NOTE — Patient Instructions (Signed)
Good to see you  Gustavus Bryantce is your friend.  meloxicam daily for 10 days then as needed Tylenol 500mg  3 times a day  Continue the vitamin D  I have to say it-  Good shoes! See me again in 4 weeks

## 2016-08-26 ENCOUNTER — Encounter: Payer: Self-pay | Admitting: Family Medicine

## 2016-08-26 MED ORDER — FUROSEMIDE 20 MG PO TABS: 20.0000 mg | ORAL_TABLET | Freq: Every day | ORAL | 3 refills | Status: DC

## 2016-08-30 MED ORDER — FUROSEMIDE 20 MG PO TABS: 20.0000 mg | ORAL_TABLET | Freq: Every day | ORAL | 3 refills | Status: AC

## 2016-08-30 MED ORDER — MELOXICAM 7.5 MG PO TABS: 7.5000 mg | ORAL_TABLET | Freq: Every day | ORAL | 1 refills | Status: DC

## 2016-08-30 NOTE — Addendum Note (Signed)
Addended by: Judi SaaSMITH, Roxsana Riding M on: 08/30/2016 07:47 AM   Modules accepted: Orders

## 2016-09-08 ENCOUNTER — Other Ambulatory Visit: Payer: Self-pay | Admitting: Obstetrics and Gynecology

## 2016-09-08 DIAGNOSIS — N63 Unspecified lump in unspecified breast: Secondary | ICD-10-CM

## 2016-09-13 ENCOUNTER — Ambulatory Visit
Admission: RE | Admit: 2016-09-13 | Discharge: 2016-09-13 | Disposition: A | Discharge status: Home / Self Care | Payer: BLUE CROSS/BLUE SHIELD | Source: Ambulatory Visit | Attending: Obstetrics and Gynecology | Admitting: Obstetrics and Gynecology

## 2016-09-13 DIAGNOSIS — N63 Unspecified lump in unspecified breast: Secondary | ICD-10-CM

## 2016-09-22 ENCOUNTER — Other Ambulatory Visit: Payer: Self-pay | Admitting: Allergy and Immunology

## 2016-09-22 DIAGNOSIS — J4540 Moderate persistent asthma, uncomplicated: Secondary | ICD-10-CM

## 2016-09-22 DIAGNOSIS — J3089 Other allergic rhinitis: Secondary | ICD-10-CM

## 2016-09-24 ENCOUNTER — Ambulatory Visit: Payer: BLUE CROSS/BLUE SHIELD | Admitting: Family Medicine

## 2016-09-28 ENCOUNTER — Ambulatory Visit: Payer: BLUE CROSS/BLUE SHIELD | Admitting: Internal Medicine

## 2016-10-11 ENCOUNTER — Ambulatory Visit: Payer: BLUE CROSS/BLUE SHIELD | Admitting: Allergy and Immunology

## 2016-10-18 ENCOUNTER — Ambulatory Visit: Payer: BLUE CROSS/BLUE SHIELD | Admitting: Internal Medicine

## 2016-10-19 ENCOUNTER — Ambulatory Visit (INDEPENDENT_AMBULATORY_CARE_PROVIDER_SITE_OTHER): Payer: BLUE CROSS/BLUE SHIELD | Admitting: Allergy and Immunology

## 2016-10-19 ENCOUNTER — Encounter: Payer: Self-pay | Admitting: Allergy and Immunology

## 2016-10-19 VITALS — BP 124/76 | HR 82 | Resp 16

## 2016-10-19 DIAGNOSIS — J3089 Other allergic rhinitis: Secondary | ICD-10-CM

## 2016-10-19 DIAGNOSIS — J4540 Moderate persistent asthma, uncomplicated: Secondary | ICD-10-CM | POA: Diagnosis not present

## 2016-10-19 NOTE — Patient Instructions (Signed)
Moderate persistent asthma Well-controlled on current treatment plan.  We will stepdown therapy at this time.  Decrease Flovent 110 g to one inhalation via spacer device twice a day.   If lower respiratory symptoms progress in frequency and/or severity, the patient is to resume the previous dose.  During respiratory tract infections or asthma flares, increase Flovent 110g to 3 inhalations 3 times per day until symptoms have returned to baseline.  Continue montelukast 10 mg daily at bedtime and albuterol HFA, 1-2 inhalations every 4-6 hours as needed.  Subjective and objective measures of pulmonary function will be followed and the treatment plan will be adjusted accordingly.  Perennial and seasonal allergic rhinitis Stable.  Continue appropriate allergen avoidance measures, fluticasone nasal spray as needed, nasal saline irrigation as needed, montelukast daily, and guaifenesin as needed.  If allergen avoidance measures and medications fail to adequately relieve symptoms, aeroallergen immunotherapy will be considered.   Return in about 5 months (around 03/21/2017), or if symptoms worsen or fail to improve.

## 2016-10-19 NOTE — Progress Notes (Signed)
Follow-up Note  RE: [name] MRN: [id number] DOB: [birth year] Date of Office Visit: 10/19/2016  Primary care provider: Myrlene Broker, MD Referring provider: Myrlene Broker, *  History of present illness: [name] is a 46 y.o. female with persistent asthma and allergic rhinoconjunctivitis presenting today for follow up.  She was last seen in this clinic on 06/07/2016.  She reports that in the interval since her previous visit her asthma has been well-controlled.  She requires albuterol rescue 2 or 3 times per month on average and denies nocturnal awakenings due to lower respiratory symptoms.  She has had no prednisone requirement or ER visits since her previous visit.  She is currently taking Flovent 110 g, 2 inhalations via spacer device twice a day, and montelukast 10 mg daily.  Her nasal allergy symptoms have been well-controlled with fluticasone nasal spray, montelukast, and nasal saline irrigation as needed.   Assessment and plan: Moderate persistent asthma Well-controlled on current treatment plan.  We will stepdown therapy at this time.  Decrease Flovent 110 g to one inhalation via spacer device twice a day.   If lower respiratory symptoms progress in frequency and/or severity, the patient is to resume the previous dose.  During respiratory tract infections or asthma flares, increase Flovent 110g to 3 inhalations 3 times per day until symptoms have returned to baseline.  Continue montelukast 10 mg daily at bedtime and albuterol HFA, 1-2 inhalations every 4-6 hours as needed.  Subjective and objective measures of pulmonary function will be followed and the treatment plan will be adjusted accordingly.  Perennial and seasonal allergic rhinitis Stable.  Continue appropriate allergen avoidance measures, fluticasone nasal spray as needed, nasal saline irrigation as needed, montelukast daily, and guaifenesin as needed.  If allergen avoidance measures  and medications fail to adequately relieve symptoms, aeroallergen immunotherapy will be considered.   Diagnostics: Spirometry:  Normal with an FEV1 of 97% predicted.  Please see scanned spirometry results for details.    Physical examination: Blood pressure 124/76, pulse 82, resp. rate 16, SpO2 98 %.  General: Alert, interactive, in no acute distress. HEENT: TMs pearly gray, turbinates minimally edematous without discharge, post-pharynx unremarkable. Neck: Supple without lymphadenopathy. Lungs: Clear to auscultation without wheezing, rhonchi or rales. CV: Normal S1, S2 without murmurs. Skin: Warm and dry, without lesions or rashes.  The following portions of the patient's history were reviewed and updated as appropriate: allergies, current medications, past family history, past medical history, past social history, past surgical history and problem list.  Allergies as of 10/19/2016   No Known Allergies     Medication List       Accurate as of 10/19/16  1:34 PM. Always use your most recent med list.          albuterol 108 (90 Base) MCG/ACT inhaler Commonly known as:  PROVENTIL HFA;VENTOLIN HFA Inhale 1 puff into the lungs every 6 (six) hours as needed for wheezing or shortness of breath.   ALPRAZolam 0.25 MG tablet Commonly known as:  XANAX take 1 tablet by mouth once daily if needed for anxiety   Azelastine-Fluticasone 137-50 MCG/ACT Susp Place 1 spray into the nose 2 (two) times daily as needed.   beclomethasone 80 MCG/ACT inhaler Commonly known as:  QVAR Inhale 2 puffs into the lungs 2 (two) times daily.   buPROPion 150 MG 24 hr tablet Commonly known as:  WELLBUTRIN XL TAKE ONE TABLET BY MOUTH ONCE DAILY   cetirizine 10 MG tablet Commonly known  as:  ZYRTEC Take 10 mg by mouth daily.   cyanocobalamin 1000 MCG/ML injection Commonly known as:  (VITAMIN B-12) INJECT 1 ML INTO THE MUSCLE EVERY 30 DAYS   Diclofenac Sodium 2 % Soln Commonly known as:   PENNSAID Place 2 application onto the skin 2 (two) times daily.   fluticasone 110 MCG/ACT inhaler Commonly known as:  FLOVENT HFA Inhale 2 puffs into the lungs 2 (two) times daily.   fluticasone 50 MCG/ACT nasal spray Commonly known as:  FLONASE Place 1 spray into both nostrils daily.   furosemide 20 MG tablet Commonly known as:  LASIX Take 1 tablet (20 mg total) by mouth daily.   hyoscyamine 0.125 MG SL tablet Commonly known as:  LEVSIN SL DISSOLVE ONE TABLET IN MOUTH EVERY 4 HOURS IF  NEEDED   meloxicam 7.5 MG tablet Commonly known as:  MOBIC Take 1 tablet (7.5 mg total) by mouth daily.   metoprolol tartrate 50 MG tablet Commonly known as:  LOPRESSOR TAKE ONE-HALF TABLET BY MOUTH TWICE DAILY   montelukast 10 MG tablet Commonly known as:  SINGULAIR TAKE ONE TABLET BY MOUTH AT BEDTIME   oxybutynin 10 MG 24 hr tablet Commonly known as:  DITROPAN-XL Take 10 mg by mouth at bedtime.   phenazopyridine 100 MG tablet Commonly known as:  PYRIDIUM Take 1 tablet (100 mg total) by mouth 3 (three) times daily as needed for pain (with food).   sodium chloride 0.65 % Soln nasal spray Commonly known as:  OCEAN Place 1 spray into both nostrils as needed for congestion.   traMADol 50 MG tablet Commonly known as:  ULTRAM TAKE ONE TABLET BY MOUTH AT BEDTIME AS NEEDED            Discharge Care Instructions        Start     Ordered   10/19/16 0000  Spirometry with Graph    Question Answer Comment  Where should this test be performed? Other   Basic spirometry Yes   Spirometry pre & post bronchodilator No      10/19/16 1333      No Known Allergies  I appreciate the opportunity to take part in Chrislynn's care. Please do not hesitate to contact me with questions.  Sincerely,   R. Jorene Guest, MD

## 2016-10-19 NOTE — Assessment & Plan Note (Signed)
Stable.  Continue appropriate allergen avoidance measures, fluticasone nasal spray as needed, nasal saline irrigation as needed, montelukast daily, and guaifenesin as needed.  If allergen avoidance measures and medications fail to adequately relieve symptoms, aeroallergen immunotherapy will be considered.

## 2016-10-19 NOTE — Assessment & Plan Note (Signed)
Well-controlled on current treatment plan.  We will stepdown therapy at this time.  Decrease Flovent 110 g to one inhalation via spacer device twice a day.   If lower respiratory symptoms progress in frequency and/or severity, the patient is to resume the previous dose.  During respiratory tract infections or asthma flares, increase Flovent 110g to 3 inhalations 3 times per day until symptoms have returned to baseline.  Continue montelukast 10 mg daily at bedtime and albuterol HFA, 1-2 inhalations every 4-6 hours as needed.  Subjective and objective measures of pulmonary function will be followed and the treatment plan will be adjusted accordingly.

## 2016-10-22 ENCOUNTER — Ambulatory Visit (INDEPENDENT_AMBULATORY_CARE_PROVIDER_SITE_OTHER): Payer: BLUE CROSS/BLUE SHIELD | Admitting: Internal Medicine

## 2016-10-22 ENCOUNTER — Other Ambulatory Visit (INDEPENDENT_AMBULATORY_CARE_PROVIDER_SITE_OTHER): Payer: BLUE CROSS/BLUE SHIELD

## 2016-10-22 ENCOUNTER — Encounter: Payer: Self-pay | Admitting: Internal Medicine

## 2016-10-22 VITALS — BP 124/70 | HR 56 | Temp 98.1°F | Ht 65.0 in | Wt 356.0 lb

## 2016-10-22 DIAGNOSIS — F4323 Adjustment disorder with mixed anxiety and depressed mood: Secondary | ICD-10-CM | POA: Diagnosis not present

## 2016-10-22 DIAGNOSIS — Z9884 Bariatric surgery status: Secondary | ICD-10-CM | POA: Diagnosis not present

## 2016-10-22 DIAGNOSIS — Z23 Encounter for immunization: Secondary | ICD-10-CM

## 2016-10-22 DIAGNOSIS — I10 Essential (primary) hypertension: Secondary | ICD-10-CM

## 2016-10-22 DIAGNOSIS — L987 Excessive and redundant skin and subcutaneous tissue: Secondary | ICD-10-CM | POA: Diagnosis not present

## 2016-10-22 DIAGNOSIS — E538 Deficiency of other specified B group vitamins: Secondary | ICD-10-CM

## 2016-10-22 LAB — CBC
HEMATOCRIT: 36.1 % (ref 36.0–46.0)
HEMOGLOBIN: 11.2 g/dL — AB (ref 12.0–15.0)
MCHC: 31.1 g/dL (ref 30.0–36.0)
MCV: 79.1 fl (ref 78.0–100.0)
PLATELETS: 267 10*3/uL (ref 150.0–400.0)
RBC: 4.57 Mil/uL (ref 3.87–5.11)
RDW: 15.9 % — ABNORMAL HIGH (ref 11.5–15.5)
WBC: 6.4 10*3/uL (ref 4.0–10.5)

## 2016-10-22 LAB — COMPREHENSIVE METABOLIC PANEL
ALBUMIN: 3.6 g/dL (ref 3.5–5.2)
ALT: 9 U/L (ref 0–35)
AST: 15 U/L (ref 0–37)
Alkaline Phosphatase: 89 U/L (ref 39–117)
BUN: 8 mg/dL (ref 6–23)
CALCIUM: 8.7 mg/dL (ref 8.4–10.5)
CHLORIDE: 103 meq/L (ref 96–112)
CO2: 30 mEq/L (ref 19–32)
CREATININE: 0.57 mg/dL (ref 0.40–1.20)
GFR: 121.81 mL/min (ref >60.00)
Glucose, Bld: 85 mg/dL (ref 70–99)
POTASSIUM: 3.8 meq/L (ref 3.5–5.1)
Sodium: 138 mEq/L (ref 135–145)
Total Bilirubin: 0.4 mg/dL (ref 0.2–1.2)
Total Protein: 6.2 g/dL (ref 6.0–8.3)

## 2016-10-22 LAB — VITAMIN B12: Vitamin B-12: 205 pg/mL — ABNORMAL LOW (ref 211–911)

## 2016-10-22 LAB — LIPID PANEL
CHOLESTEROL: 143 mg/dL (ref 0–200)
HDL: 61.2 mg/dL (ref >39.00)
LDL CALC: 70 mg/dL (ref 0–99)
NonHDL: 81.76
TRIGLYCERIDES: 61 mg/dL (ref 0.0–149.0)
Total CHOL/HDL Ratio: 2
VLDL: 12.2 mg/dL (ref 0.0–40.0)

## 2016-10-22 LAB — HEMOGLOBIN A1C: Hgb A1c MFr Bld: 5.6 % (ref 4.6–6.5)

## 2016-10-22 LAB — TSH: TSH: 2.88 u[IU]/mL (ref 0.35–4.50)

## 2016-10-22 NOTE — Assessment & Plan Note (Signed)
BP at goal on her metoprolol and lasix. Checking CMP and adjust as needed.

## 2016-10-22 NOTE — Patient Instructions (Signed)
We are checking the labs today and have given you the letter for the appeal for the surgery.   Call us if you think you need a change to the wellbutrin.

## 2016-10-22 NOTE — Assessment & Plan Note (Signed)
Feel that her paniculectomy would be medically necessary given recurrent infections and excoriations and letter given today. Plastic surgeon willing to do surgery if insurance will approve.

## 2016-10-22 NOTE — Assessment & Plan Note (Signed)
Needs monitoring of electrolytes, vitamins, HgA1c, lipid panel.

## 2016-10-22 NOTE — Assessment & Plan Note (Signed)
Some increasing stress with loss of job and she is okay with wellbutrin 150 mg daily. Offered increase to 300 mg daily.

## 2016-10-22 NOTE — Assessment & Plan Note (Signed)
Checking B12 today, taking oral replacement s/p gastric bypass.

## 2016-10-22 NOTE — Progress Notes (Signed)
   Subjective:    Patient ID: [name], female    DOB: 10/13/1970, 46 y.o.   MRN: [id number]  HPI The patient is a 46 YO female coming in for follow up of her medical problems including her b12 deficiency (s/p gastric bypass, takes B12 pills daily, absorption is poor, denies new numbness or weakness), and sugars (impaired in the past, needs Hga1c to screen for progression to diabetes, not exercising, diet is similar to prior), and her infections in her panus (referred to plastic surgery and they agree with paniculectomy but insurance would not approve, she would like letter for approval, no current infection, some excoriations, several infections per year typically),   Review of Systems  Constitutional: Negative.   HENT: Negative.   Eyes: Negative.   Respiratory: Negative for cough, chest tightness and shortness of breath.   Cardiovascular: Negative for chest pain, palpitations and leg swelling.  Gastrointestinal: Negative for abdominal distention, abdominal pain, constipation, diarrhea, nausea and vomiting.  Musculoskeletal: Negative.   Skin: Negative.   Neurological: Negative.   Psychiatric/Behavioral: Negative.       Objective:   Physical Exam  Constitutional: She is oriented to person, place, and time. She appears well-developed and well-nourished.  Obese  HENT:  Head: Normocephalic and atraumatic.  Eyes: EOM are normal.  Neck: Normal range of motion.  Cardiovascular: Normal rate and regular rhythm.   Pulmonary/Chest: Effort normal and breath sounds normal. No respiratory distress. She has no wheezes. She has no rales.  Abdominal: Soft. Bowel sounds are normal. She exhibits no distension. There is no tenderness. There is no rebound.  Musculoskeletal: She exhibits no edema.  Neurological: She is alert and oriented to person, place, and time. Coordination normal.  Skin: Skin is warm and dry.  Psychiatric: She has a normal mood and affect.   Vitals:   10/22/16 0938  BP:  124/70  Pulse: (!) 56  Temp: 98.1 F (36.7 C)  TempSrc: Oral  SpO2: 100%  Weight: (!) 356 lb (161.5 kg)  Height:  (1.651 m)      Assessment & Plan:  Flu shot given at visit

## 2016-10-25 ENCOUNTER — Encounter: Payer: Self-pay | Admitting: Internal Medicine

## 2016-10-26 ENCOUNTER — Other Ambulatory Visit: Payer: Self-pay | Admitting: Family

## 2016-10-26 MED ORDER — CYANOCOBALAMIN 1000 MCG/ML IJ SOLN: INTRAMUSCULAR | 11 refills | Status: DC

## 2016-10-26 MED ORDER — ALPRAZOLAM 0.25 MG PO TABS: ORAL_TABLET | ORAL | 2 refills | Status: DC

## 2016-10-28 ENCOUNTER — Encounter: Payer: Self-pay | Admitting: Nurse Practitioner

## 2016-10-28 ENCOUNTER — Ambulatory Visit (INDEPENDENT_AMBULATORY_CARE_PROVIDER_SITE_OTHER): Payer: BLUE CROSS/BLUE SHIELD | Admitting: Nurse Practitioner

## 2016-10-28 VITALS — BP 120/76 | HR 54 | Temp 98.3°F | Ht 65.0 in | Wt 355.0 lb

## 2016-10-28 DIAGNOSIS — J029 Acute pharyngitis, unspecified: Secondary | ICD-10-CM

## 2016-10-28 LAB — POCT RAPID STREP A (OFFICE): RAPID STREP A SCREEN: NEGATIVE

## 2016-10-28 NOTE — Patient Instructions (Signed)
With negative strep swab, we will treat this as viral infection. Use chloraseptic lozenges of salt water gargles to soothe throat.  Call office if symptoms do no improve in 5-7days.  Pharyngitis Pharyngitis is a sore throat (pharynx). There is redness, pain, and swelling of your throat. Follow these instructions at home:  Drink enough fluids to keep your pee (urine) clear or pale yellow.  Only take medicine as told by your doctor. ? You may get sick again if you do not take medicine as told. Finish your medicines, even if you start to feel better. ? Do not take aspirin.  Rest.  Rinse your mouth (gargle) with salt water ( tsp of salt per 1 qt of water) every 1-2 hours. This will help the pain.  If you are not at risk for choking, you can suck on hard candy or sore throat lozenges. Contact a doctor if:  You have large, tender lumps on your neck.  You have a rash.  You cough up green, yellow-brown, or bloody spit. Get help right away if:  You have a stiff neck.  You drool or cannot swallow liquids.  You throw up (vomit) or are not able to keep medicine or liquids down.  You have very bad pain that does not go away with medicine.  You have problems breathing (not from a stuffy nose). This information is not intended to replace advice given to you by your health care provider. Make sure you discuss any questions you have with your health care provider. Document Released: 06/30/2007 Document Revised: 06/19/2015 Document Reviewed: 09/18/2012 Elsevier Interactive Patient Education  2017 [employer].

## 2016-10-28 NOTE — Progress Notes (Signed)
Subjective:  Patient ID: [name], female    DOB: [birth year]  Age: 46 y.o. MRN: [id number]  CC: Sore Throat (sore throat,swelling on neck--going on 2 days. )   Sore Throat   This is a new problem. The current episode started in the past 7 days. The problem has been gradually worsening. The pain is worse on the left side. There has been no fever. Associated symptoms include congestion and swollen glands. Pertinent negatives include no coughing, ear discharge, ear pain, hoarse voice, plugged ear sensation, stridor or trouble swallowing. She has had no exposure to strep or mono. She has tried nothing for the symptoms.   Has sick grandson with similar symptoms. evaluated by pediatrician: states it was viral infection. He was not tested for strep.  Outpatient Medications Prior to Visit  Medication Sig Dispense Refill  . albuterol (PROVENTIL HFA;VENTOLIN HFA) 108 (90 Base) MCG/ACT inhaler Inhale 1 puff into the lungs every 6 (six) hours as needed for wheezing or shortness of breath. 1 Inhaler 0  . ALPRAZolam (XANAX) 0.25 MG tablet take 1 tablet by mouth once daily if needed for anxiety 30 tablet 2  . Azelastine-Fluticasone 137-50 MCG/ACT SUSP Place 1 spray into the nose 2 (two) times daily as needed. 23 g 5  . beclomethasone (QVAR) 80 MCG/ACT inhaler Inhale 2 puffs into the lungs 2 (two) times daily. 1 Inhaler 5  . buPROPion (WELLBUTRIN XL) 150 MG 24 hr tablet TAKE ONE TABLET BY MOUTH ONCE DAILY 90 tablet 1  . cetirizine (ZYRTEC) 10 MG tablet Take 10 mg by mouth daily.    . cyanocobalamin (,VITAMIN B-12,) 1000 MCG/ML injection INJECT 1 ML INTO THE MUSCLE EVERY 30 DAYS 1 mL 11  . Diclofenac Sodium (PENNSAID) 2 % SOLN Place 2 application onto the skin 2 (two) times daily. 112 g 3  . fluticasone (FLONASE) 50 MCG/ACT nasal spray Place 1 spray into both nostrils daily. 16 g 0  . fluticasone (FLOVENT HFA) 110 MCG/ACT inhaler Inhale 2 puffs into the lungs 2 (two) times daily. 1 Inhaler 5  .  furosemide (LASIX) 20 MG tablet Take 1 tablet (20 mg total) by mouth daily. 30 tablet 3  . hyoscyamine (LEVSIN SL) 0.125 MG SL tablet DISSOLVE ONE TABLET IN MOUTH EVERY 4 HOURS IF  NEEDED 30 tablet 2  . meloxicam (MOBIC) 7.5 MG tablet Take 1 tablet (7.5 mg total) by mouth daily. 90 tablet 1  . metoprolol (LOPRESSOR) 50 MG tablet TAKE ONE-HALF TABLET BY MOUTH TWICE DAILY 30 tablet 3  . montelukast (SINGULAIR) 10 MG tablet TAKE ONE TABLET BY MOUTH AT BEDTIME 30 tablet 2  . oxybutynin (DITROPAN-XL) 10 MG 24 hr tablet Take 10 mg by mouth at bedtime.    . phenazopyridine (PYRIDIUM) 100 MG tablet Take 1 tablet (100 mg total) by mouth 3 (three) times daily as needed for pain (with food). 10 tablet 0  . sodium chloride (OCEAN) 0.65 % SOLN nasal spray Place 1 spray into both nostrils as needed for congestion. 480 mL 0  . traMADol (ULTRAM) 50 MG tablet TAKE ONE TABLET BY MOUTH AT BEDTIME AS NEEDED 30 tablet 3   No facility-administered medications prior to visit.     ROS See HPI  Objective:  BP 120/76   Pulse (!) 54   Temp 98.3 F (36.8 C)   Ht  (1.651 m)   Wt (!) 355 lb (161 kg)   SpO2 98%   BMI 59.08 kg/m   BP Readings from Last  3 Encounters:  10/28/16 120/76  10/22/16 124/70  10/19/16 124/76    Wt Readings from Last 3 Encounters:  10/28/16 (!) 355 lb (161 kg)  10/22/16 (!) 356 lb (161.5 kg)  08/24/16 (!) 358 lb (162.4 kg)    Physical Exam  Constitutional: She is oriented to person, place, and time.  HENT:  Right Ear: Tympanic membrane, external ear and ear canal normal.  Left Ear: Tympanic membrane, external ear and ear canal normal.  Nose: Nose normal.  Mouth/Throat: Uvula is midline. No trismus in the jaw. Posterior oropharyngeal erythema present. No oropharyngeal exudate.  Neck: Normal range of motion. Neck supple. No thyromegaly present.  Cardiovascular: Normal rate.   Pulmonary/Chest: Effort normal.  Lymphadenopathy:    She has cervical adenopathy.  Neurological:  She is alert and oriented to person, place, and time.  Skin: Skin is warm and dry. No rash noted.  Vitals reviewed.   Lab Results  Component Value Date   WBC 6.4 10/22/2016   HGB 11.2 (L) 10/22/2016   HCT 36.1 10/22/2016   PLT 267.0 10/22/2016   GLUCOSE 85 10/22/2016   CHOL 143 10/22/2016   TRIG 61.0 10/22/2016   HDL 61.20 10/22/2016   LDLCALC 70 10/22/2016   ALT 9 10/22/2016   AST 15 10/22/2016   NA 138 10/22/2016   K 3.8 10/22/2016   CL 103 10/22/2016   CREATININE 0.57 10/22/2016   BUN 8 10/22/2016   CO2 30 10/22/2016   TSH 2.88 10/22/2016   HGBA1C 5.6 10/22/2016    US Breast Ltd Uni Right Inc Axilla  Result Date: 09/13/2016 CLINICAL DATA:  46 year old female with palpable right breast lump discovered on self-examination. EXAM: 2D DIGITAL DIAGNOSTIC RIGHT MAMMOGRAM WITH CAD AND ADJUNCT TOMO ULTRASOUND RIGHT BREAST COMPARISON:  Previous exam(s). ACR Breast Density Category a: The breast tissue is almost entirely fatty. FINDINGS: 2D and 3D full field and spot compression views of the right breast demonstrate no suspicious mass, distortion or worrisome calcifications. Mammographic images were processed with CAD. On physical exam, minimal thickening without discrete palpable mass identified in the outer right breast, in the area of patient concern. Targeted ultrasound is performed, showing no solid or cystic mass, distortion or abnormal shadowing within the outer right breast in the area of patient concern. IMPRESSION: No mammographic, suspicious palpable or sonographic abnormality within the outer right breast, in the area of patient concern. No mammographic evidence of right breast malignancy. RECOMMENDATION: Bilateral screening mammograms in 3 months to resume annual mammogram schedule. I have discussed the findings and recommendations with the patient. Results were also provided in writing at the conclusion of the visit. If applicable, a reminder letter will be sent to the patient  regarding the next appointment. BI-RADS CATEGORY  1: Negative. Electronically Signed   By: Harmon Pier M.D.   On: 09/13/2016 09:50   Mm Diag Breast Tomo Uni Right  Result Date: 09/13/2016 CLINICAL DATA:  46 year old female with palpable right breast lump discovered on self-examination. EXAM: 2D DIGITAL DIAGNOSTIC RIGHT MAMMOGRAM WITH CAD AND ADJUNCT TOMO ULTRASOUND RIGHT BREAST COMPARISON:  Previous exam(s). ACR Breast Density Category a: The breast tissue is almost entirely fatty. FINDINGS: 2D and 3D full field and spot compression views of the right breast demonstrate no suspicious mass, distortion or worrisome calcifications. Mammographic images were processed with CAD. On physical exam, minimal thickening without discrete palpable mass identified in the outer right breast, in the area of patient concern. Targeted ultrasound is performed, showing no solid or cystic mass,  distortion or abnormal shadowing within the outer right breast in the area of patient concern. IMPRESSION: No mammographic, suspicious palpable or sonographic abnormality within the outer right breast, in the area of patient concern. No mammographic evidence of right breast malignancy. RECOMMENDATION: Bilateral screening mammograms in 3 months to resume annual mammogram schedule. I have discussed the findings and recommendations with the patient. Results were also provided in writing at the conclusion of the visit. If applicable, a reminder letter will be sent to the patient regarding the next appointment. BI-RADS CATEGORY  1: Negative. Electronically Signed   By: Harmon Pier M.D.   On: 09/13/2016 09:50    Assessment & Plan:   Tiondra was seen today for sore throat.  Diagnoses and all orders for this visit:  Acute pharyngitis, unspecified etiology -     POCT rapid strep A   I am having Ms. Winkel maintain her cetirizine, albuterol, fluticasone, sodium chloride, Azelastine-Fluticasone, beclomethasone, traMADol, hyoscyamine,  metoprolol tartrate, Diclofenac Sodium, fluticasone, buPROPion, oxybutynin, phenazopyridine, meloxicam, furosemide, montelukast, cyanocobalamin, and ALPRAZolam.  No orders of the defined types were placed in this encounter.   Follow-up: Return if symptoms worsen or fail to improve.  Alysia Penna, NP

## 2016-11-03 ENCOUNTER — Encounter: Payer: Self-pay | Admitting: Allergy and Immunology

## 2016-11-03 ENCOUNTER — Other Ambulatory Visit: Payer: Self-pay

## 2016-11-03 MED ORDER — ALBUTEROL SULFATE HFA 108 (90 BASE) MCG/ACT IN AERS: 1.0000 | INHALATION_SPRAY | Freq: Four times a day (QID) | RESPIRATORY_TRACT | 0 refills | Status: DC | PRN

## 2016-11-22 ENCOUNTER — Ambulatory Visit (INDEPENDENT_AMBULATORY_CARE_PROVIDER_SITE_OTHER): Payer: BLUE CROSS/BLUE SHIELD | Admitting: Nurse Practitioner

## 2016-11-22 ENCOUNTER — Encounter: Payer: Self-pay | Admitting: Nurse Practitioner

## 2016-11-22 VITALS — BP 120/80 | HR 52 | Temp 97.7°F | Ht 65.0 in | Wt 354.0 lb

## 2016-11-22 DIAGNOSIS — S7012XA Contusion of left thigh, initial encounter: Secondary | ICD-10-CM | POA: Diagnosis not present

## 2016-11-22 NOTE — Patient Instructions (Addendum)
Please contact us if bruising spreads, or pain worsens or if develops leg swelling or pain with walking.  Avoid aspirin or ibuprofen or naproxen or BC powder for 1week.  Contusion A contusion is a deep bruise. Contusions are the result of a blunt injury to tissues and muscle fibers under the skin. The injury causes bleeding under the skin. The skin overlying the contusion may turn blue, purple, or yellow. Minor injuries will give you a painless contusion, but more severe contusions may stay painful and swollen for a few weeks. What are the causes? This condition is usually caused by a blow, trauma, or direct force to an area of the body. What are the signs or symptoms? Symptoms of this condition include:  Swelling of the injured area.  Pain and tenderness in the injured area.  Discoloration. The area may have redness and then turn blue, purple, or yellow.  How is this diagnosed? This condition is diagnosed based on a physical exam and medical history. An X-ray, CT scan, or MRI may be needed to determine if there are any associated injuries, such as broken bones (fractures). How is this treated? Specific treatment for this condition depends on what area of the body was injured. In general, the best treatment for a contusion is resting, icing, applying pressure to (compression), and elevating the injured area. This is often called the RICE strategy. Over-the-counter anti-inflammatory medicines may also be recommended for pain control. Follow these instructions at home:  Rest the injured area.  If directed, apply ice to the injured area: ? Put ice in a plastic bag. ? Place a towel between your skin and the bag. ? Leave the ice on for 20 minutes, 2-3 times per day.  If directed, apply light compression to the injured area using an elastic bandage. Make sure the bandage is not wrapped too tightly. Remove and reapply the bandage as directed by your health care provider.  If possible, raise  (elevate) the injured area above the level of your heart while you are sitting or lying down.  Take over-the-counter and prescription medicines only as told by your health care provider. Contact a health care provider if:  Your symptoms do not improve after several days of treatment.  Your symptoms get worse.  You have difficulty moving the injured area. Get help right away if:  You have severe pain.  You have numbness in a hand or foot.  Your hand or foot turns pale or cold. This information is not intended to replace advice given to you by your health care provider. Make sure you discuss any questions you have with your health care provider. Document Released: 10/21/2004 Document Revised: 05/22/2015 Document Reviewed: 05/29/2014 Elsevier Interactive Patient Education  2017 [employer]

## 2016-11-22 NOTE — Progress Notes (Signed)
Subjective:  Patient ID: [name], female    DOB: 03-26-1970  Age: 46 y.o. MRN: [id number]  CC: Leg Swelling (left lower leg swelling,painful at time,bruises/ notice on Friday. )   Leg Pain   The incident occurred 2 days ago. The injury mechanism is unknown. The pain is present in the left thigh. The quality of the pain is described as burning. The pain is mild. The pain has been improving since onset. Pertinent negatives include no inability to bear weight, loss of motion, loss of sensation, muscle weakness, numbness or tingling. Nothing aggravates the symptoms. She has tried nothing for the symptoms.  denies any recent travel, no leg injury.  Outpatient Medications Prior to Visit  Medication Sig Dispense Refill  . albuterol (PROVENTIL HFA;VENTOLIN HFA) 108 (90 Base) MCG/ACT inhaler Inhale 1 puff into the lungs every 6 (six) hours as needed for wheezing or shortness of breath. 1 Inhaler 0  . ALPRAZolam (XANAX) 0.25 MG tablet take 1 tablet by mouth once daily if needed for anxiety 30 tablet 2  . Azelastine-Fluticasone 137-50 MCG/ACT SUSP Place 1 spray into the nose 2 (two) times daily as needed. 23 g 5  . beclomethasone (QVAR) 80 MCG/ACT inhaler Inhale 2 puffs into the lungs 2 (two) times daily. 1 Inhaler 5  . buPROPion (WELLBUTRIN XL) 150 MG 24 hr tablet TAKE ONE TABLET BY MOUTH ONCE DAILY 90 tablet 1  . cetirizine (ZYRTEC) 10 MG tablet Take 10 mg by mouth daily.    . cyanocobalamin (,VITAMIN B-12,) 1000 MCG/ML injection INJECT 1 ML INTO THE MUSCLE EVERY 30 DAYS 1 mL 11  . Diclofenac Sodium (PENNSAID) 2 % SOLN Place 2 application onto the skin 2 (two) times daily. 112 g 3  . fluticasone (FLONASE) 50 MCG/ACT nasal spray Place 1 spray into both nostrils daily. 16 g 0  . fluticasone (FLOVENT HFA) 110 MCG/ACT inhaler Inhale 2 puffs into the lungs 2 (two) times daily. 1 Inhaler 5  . furosemide (LASIX) 20 MG tablet Take 1 tablet (20 mg total) by mouth daily. 30 tablet 3  . hyoscyamine  (LEVSIN SL) 0.125 MG SL tablet DISSOLVE ONE TABLET IN MOUTH EVERY 4 HOURS IF  NEEDED 30 tablet 2  . meloxicam (MOBIC) 7.5 MG tablet Take 1 tablet (7.5 mg total) by mouth daily. 90 tablet 1  . metoprolol (LOPRESSOR) 50 MG tablet TAKE ONE-HALF TABLET BY MOUTH TWICE DAILY 30 tablet 3  . montelukast (SINGULAIR) 10 MG tablet TAKE ONE TABLET BY MOUTH AT BEDTIME 30 tablet 2  . oxybutynin (DITROPAN-XL) 10 MG 24 hr tablet Take 10 mg by mouth at bedtime.    . sodium chloride (OCEAN) 0.65 % SOLN nasal spray Place 1 spray into both nostrils as needed for congestion. 480 mL 0  . traMADol (ULTRAM) 50 MG tablet TAKE ONE TABLET BY MOUTH AT BEDTIME AS NEEDED 30 tablet 3  . phenazopyridine (PYRIDIUM) 100 MG tablet Take 1 tablet (100 mg total) by mouth 3 (three) times daily as needed for pain (with food). (Patient not taking: Reported on 11/22/2016) 10 tablet 0   No facility-administered medications prior to visit.     ROS See HPI  Objective:  BP 120/80   Pulse (!) 52   Temp 97.7 F (36.5 C)   Ht 5\' 5"  (1.651 m)   Wt (!) 354 lb (160.6 kg)   SpO2 99%   BMI 58.91 kg/m   BP Readings from Last 3 Encounters:  11/22/16 120/80  10/28/16 120/76  10/22/16 124/70  Wt Readings from Last 3 Encounters:  11/22/16 (!) 354 lb (160.6 kg)  10/28/16 (!) 355 lb (161 kg)  10/22/16 (!) 356 lb (161.5 kg)    Physical Exam  Constitutional: She is oriented to person, place, and time.  Musculoskeletal:       Left knee: Normal.       Left ankle: Normal.       Left upper leg: Normal.       Left lower leg: Normal.       Left foot: Normal.  Neurological: She is alert and oriented to person, place, and time.  Skin: Skin is warm, dry and intact. Bruising noted.       Lab Results  Component Value Date   WBC 6.4 10/22/2016   HGB 11.2 (L) 10/22/2016   HCT 36.1 10/22/2016   PLT 267.0 10/22/2016   GLUCOSE 85 10/22/2016   CHOL 143 10/22/2016   TRIG 61.0 10/22/2016   HDL 61.20 10/22/2016   LDLCALC 70 10/22/2016    ALT 9 10/22/2016   AST 15 10/22/2016   NA 138 10/22/2016   K 3.8 10/22/2016   CL 103 10/22/2016   CREATININE 0.57 10/22/2016   BUN 8 10/22/2016   CO2 30 10/22/2016   TSH 2.88 10/22/2016   HGBA1C 5.6 10/22/2016    [place] Breast Ltd Uni Right Inc Axilla  Result Date: 09/13/2016 CLINICAL DATA:  46 year old female with palpable right breast lump discovered on self-examination. EXAM: 2D DIGITAL DIAGNOSTIC RIGHT MAMMOGRAM WITH CAD AND ADJUNCT TOMO ULTRASOUND RIGHT BREAST COMPARISON:  Previous exam(s). ACR Breast Density Category a: The breast tissue is almost entirely fatty. FINDINGS: 2D and 3D full field and spot compression views of the right breast demonstrate no suspicious mass, distortion or worrisome calcifications. Mammographic images were processed with CAD. On physical exam, minimal thickening without discrete palpable mass identified in the outer right breast, in the area of patient concern. Targeted ultrasound is performed, showing no solid or cystic mass, distortion or abnormal shadowing within the outer right breast in the area of patient concern. IMPRESSION: No mammographic, suspicious palpable or sonographic abnormality within the outer right breast, in the area of patient concern. No mammographic evidence of right breast malignancy. RECOMMENDATION: Bilateral screening mammograms in 3 months to resume annual mammogram schedule. I have discussed the findings and recommendations with the patient. Results were also provided in writing at the conclusion of the visit. If applicable, a reminder letter will be sent to the patient regarding the next appointment. BI-RADS CATEGORY  1: Negative. Electronically Signed   By: Harmon PierJeffrey  Hu M.D.   On: 09/13/2016 09:50   Mm Diag Breast Tomo Uni Right  Result Date: 09/13/2016 CLINICAL DATA:  46 year old female with palpable right breast lump discovered on self-examination. EXAM: 2D DIGITAL DIAGNOSTIC RIGHT MAMMOGRAM WITH CAD AND ADJUNCT TOMO ULTRASOUND RIGHT  BREAST COMPARISON:  Previous exam(s). ACR Breast Density Category a: The breast tissue is almost entirely fatty. FINDINGS: 2D and 3D full field and spot compression views of the right breast demonstrate no suspicious mass, distortion or worrisome calcifications. Mammographic images were processed with CAD. On physical exam, minimal thickening without discrete palpable mass identified in the outer right breast, in the area of patient concern. Targeted ultrasound is performed, showing no solid or cystic mass, distortion or abnormal shadowing within the outer right breast in the area of patient concern. IMPRESSION: No mammographic, suspicious palpable or sonographic abnormality within the outer right breast, in the area of patient concern. No mammographic evidence of  right breast malignancy. RECOMMENDATION: Bilateral screening mammograms in 3 months to resume annual mammogram schedule. I have discussed the findings and recommendations with the patient. Results were also provided in writing at the conclusion of the visit. If applicable, a reminder letter will be sent to the patient regarding the next appointment. BI-RADS CATEGORY  1: Negative. Electronically Signed   By: Harmon Pier M.D.   On: 09/13/2016 09:50    Assessment & Plan:   Ayala was seen today for leg swelling.  Diagnoses and all orders for this visit:  Contusion of left thigh, initial encounter   I am having Ms. Desmarais maintain her cetirizine, fluticasone, sodium chloride, Azelastine-Fluticasone, beclomethasone, traMADol, hyoscyamine, metoprolol tartrate, Diclofenac Sodium, fluticasone, buPROPion, oxybutynin, phenazopyridine, meloxicam, furosemide, montelukast, cyanocobalamin, ALPRAZolam, and albuterol.  No orders of the defined types were placed in this encounter.   Follow-up: No Follow-up on file.  Alysia Penna, NP

## 2016-11-27 ENCOUNTER — Telehealth: Payer: Self-pay | Admitting: Surgical

## 2016-11-27 ENCOUNTER — Emergency Department (HOSPITAL_BASED_OUTPATIENT_CLINIC_OR_DEPARTMENT_OTHER)
Admit: 2016-11-27 | Discharge: 2016-11-27 | Disposition: A | Discharge status: Home / Self Care | Payer: BLUE CROSS/BLUE SHIELD | Attending: Emergency Medicine | Admitting: Emergency Medicine

## 2016-11-27 ENCOUNTER — Other Ambulatory Visit: Payer: Self-pay

## 2016-11-27 ENCOUNTER — Ambulatory Visit: Payer: BLUE CROSS/BLUE SHIELD | Admitting: Family Medicine

## 2016-11-27 ENCOUNTER — Emergency Department (HOSPITAL_COMMUNITY)
Admission: EM | Admit: 2016-11-27 | Discharge: 2016-11-27 | Disposition: A | Discharge status: Home / Self Care | Payer: BLUE CROSS/BLUE SHIELD | Attending: Emergency Medicine | Admitting: Emergency Medicine

## 2016-11-27 ENCOUNTER — Encounter (HOSPITAL_COMMUNITY): Payer: Self-pay

## 2016-11-27 DIAGNOSIS — M7989 Other specified soft tissue disorders: Secondary | ICD-10-CM | POA: Diagnosis not present

## 2016-11-27 DIAGNOSIS — M79605 Pain in left leg: Secondary | ICD-10-CM | POA: Diagnosis present

## 2016-11-27 DIAGNOSIS — Z79899 Other long term (current) drug therapy: Secondary | ICD-10-CM | POA: Diagnosis not present

## 2016-11-27 DIAGNOSIS — J45909 Unspecified asthma, uncomplicated: Secondary | ICD-10-CM | POA: Insufficient documentation

## 2016-11-27 DIAGNOSIS — M79609 Pain in unspecified limb: Secondary | ICD-10-CM | POA: Diagnosis not present

## 2016-11-27 DIAGNOSIS — I10 Essential (primary) hypertension: Secondary | ICD-10-CM | POA: Insufficient documentation

## 2016-11-27 NOTE — ED Notes (Signed)
Pt transported to vascular.  °

## 2016-11-27 NOTE — ED Triage Notes (Signed)
Pt seem PCP 10/30/018 for bruise on LLE, dianosis was thought to be superficial clot.  Pain and swelling worse in back of calf, knee and lower half of upper left leg.

## 2016-11-27 NOTE — ED Notes (Signed)
Pt wheeled to restroom in wheelchair

## 2016-11-27 NOTE — ED Notes (Signed)
Patient given discharge instructions and verbalized understanding.  Patient stable to discharge at this time.  Patient is alert and oriented to baseline.  No distressed noted at this time.  All belongings taken with the patient at discharge.   

## 2016-11-27 NOTE — ED Provider Notes (Signed)
[facility] EMERGENCY DEPARTMENT Provider Note   CSN: [id number] Arrival date & time: 11/27/16  1130     History   Chief Complaint Chief Complaint  Patient presents with  . Leg Pain    HPI [name] is a 46 y.o. female.  HPI Patient presents with some pain in the veins on her left lower extremity.  There is a history of varicose veins on this leg.  She was seen at her primary care doctor a few days ago after she had a bruise in the area.  No known trauma.  She states over the last couple days the pain has gotten more in the back of her calf and left lower leg.  Bruise is actually gotten smaller.  States the veins however more swollen.  Previous history of superficial vein thrombosis but in the other leg.  No DVT history.  Has a history of asthma but has no real change in her shortness of breath.  Patient is morbidly obese Past Medical History:  Diagnosis Date  . Anemia   . Asthma   . Bulging disc   . Hypertension   . Obesity   . PONV (postoperative nausea and vomiting)    PT IS [place]    Patient Active Problem List   Diagnosis Date Noted  . Degenerative arthritis of right knee 07/05/2016  . Patellofemoral disorder, right 05/31/2016  . Excessive and redundant skin and subcutaneous tissue 03/30/2016  . Chronic sinusitis 01/05/2016  . Routine general medical examination at a health care facility 09/30/2015  . Adjustment disorder with mixed anxiety and depressed mood 07/04/2015  . B12 deficiency 09/25/2014  . Chondromalacia patellae of left knee 05/28/2014  . GERD (gastroesophageal reflux disease) 03/05/2014  . Morbid obesity (HCC) 03/05/2014  . Perennial and seasonal allergic rhinitis 03/05/2014  . Left knee pain 03/05/2014  . Essential hypertension 03/05/2014  . Moderate persistent asthma 03/05/2014  . Hx of gastric bypass 03/05/2014    Past Surgical History:  Procedure Laterality Date  . CHOLECYSTECTOMY    . DILITATION &  CURRETTAGE/HYSTROSCOPY WITH NOVASURE ABLATION N/A 09/20/2013   Procedure: DILATATION & CURETTAGE/HYSTEROSCOPY WITH NOVASURE ABLATION;  Surgeon: Juluis Mire, MD;  Location: [facility];  Service: Gynecology;  Laterality: N/A;  . GASTRIC BYPASS    . LAPAROSCOPIC TUBAL LIGATION Bilateral 09/20/2013   Procedure: LAPAROSCOPIC TUBAL LIGATION;  Surgeon: Juluis Mire, MD;  Location: [facility];  Service: Gynecology;  Laterality: Bilateral;    OB History    No data available       Home Medications    Prior to Admission medications   Medication Sig Start Date End Date Taking? Authorizing Provider  albuterol (PROVENTIL HFA;VENTOLIN HFA) 108 (90 Base) MCG/ACT inhaler Inhale 1 puff into the lungs every 6 (six) hours as needed for wheezing or shortness of breath. 11/03/16   Bobbitt, Heywood Iles, MD  ALPRAZolam Prudy Feeler) 0.25 MG tablet take 1 tablet by mouth once daily if needed for anxiety 10/26/16   Myrlene Broker, MD  Azelastine-Fluticasone 137-50 MCG/ACT SUSP Place 1 spray into the nose 2 (two) times daily as needed. 01/05/16   Bobbitt, Heywood Iles, MD  beclomethasone (QVAR) 80 MCG/ACT inhaler Inhale 2 puffs into the lungs 2 (two) times daily. 01/05/16   Bobbitt, Heywood Iles, MD  buPROPion (WELLBUTRIN XL) 150 MG 24 hr tablet TAKE ONE TABLET BY MOUTH ONCE DAILY 07/06/16   Veryl Speak, FNP  cetirizine (ZYRTEC) 10 MG tablet Take 10 mg by mouth  daily.    [provider]  cyanocobalamin (,VITAMIN B-12,) 1000 MCG/ML injection INJECT 1 ML INTO THE MUSCLE EVERY 30 DAYS 10/26/16   Myrlene Brokerrawford, Elizabeth A, MD  Diclofenac Sodium (PENNSAID) 2 % SOLN Place 2 application onto the skin 2 (two) times daily. 05/31/16   Judi SaaSmith, Zachary M, DO  fluticasone (FLONASE) 50 MCG/ACT nasal spray Place 1 spray into both nostrils daily. 03/25/15   Horton, Mayer Maskerourtney F, MD  fluticasone (FLOVENT HFA) 110 MCG/ACT inhaler Inhale 2 puffs into the lungs 2 (two) times daily. 07/02/16   Bobbitt, Heywood Ilesalph Carter, MD  furosemide (LASIX) 20  MG tablet Take 1 tablet (20 mg total) by mouth daily. 08/30/16   Judi SaaSmith, Zachary M, DO  hyoscyamine (LEVSIN SL) 0.125 MG SL tablet DISSOLVE ONE TABLET IN MOUTH EVERY 4 HOURS IF  NEEDED 02/06/16   Myrlene Brokerrawford, Elizabeth A, MD  meloxicam (MOBIC) 7.5 MG tablet Take 1 tablet (7.5 mg total) by mouth daily. 08/30/16   Judi SaaSmith, Zachary M, DO  metoprolol (LOPRESSOR) 50 MG tablet TAKE ONE-HALF TABLET BY MOUTH TWICE DAILY 03/10/16   Myrlene Brokerrawford, Elizabeth A, MD  montelukast (SINGULAIR) 10 MG tablet TAKE ONE TABLET BY MOUTH AT BEDTIME 09/22/16   Bobbitt, Heywood Ilesalph Carter, MD  oxybutynin (DITROPAN-XL) 10 MG 24 hr tablet Take 10 mg by mouth at bedtime.    [provider]  phenazopyridine (PYRIDIUM) 100 MG tablet Take 1 tablet (100 mg total) by mouth 3 (three) times daily as needed for pain (with food). Patient not taking: Reported on 11/22/2016 07/13/16   Nche, Bonna Gainsharlotte Lum, NP  sodium chloride (OCEAN) 0.65 % SOLN nasal spray Place 1 spray into both nostrils as needed for congestion. 03/25/15   Horton, Mayer Maskerourtney F, MD  traMADol (ULTRAM) 50 MG tablet TAKE ONE TABLET BY MOUTH AT BEDTIME AS NEEDED 02/06/16   Myrlene Brokerrawford, Elizabeth A, MD    Family History Family History  Problem Relation Age of Onset  . Breast cancer Mother   . Hypertension Sister     Social History Social History  Substance Use Topics  . Smoking status: Never Smoker  . Smokeless tobacco: Never Used  . Alcohol use No     Allergies   Patient has no known allergies.   Review of Systems Review of Systems  Constitutional: Negative for appetite change and fever.  Respiratory: Positive for shortness of breath.   Cardiovascular: Negative for chest pain.  Gastrointestinal: Negative for abdominal pain.  Musculoskeletal: Positive for arthralgias. Negative for gait problem.  Skin: Negative for rash.  Neurological: Negative for syncope and numbness.     Physical Exam Updated Vital Signs BP (!) 141/74   Pulse (!) 51   Temp 98.2 F (36.8 C) (Oral)    Resp 18   SpO2 100%   Physical Exam  Constitutional: She appears well-developed.  Patient is morbidly obese  HENT:  Head: Atraumatic.  Neck: No JVD present.  Pulmonary/Chest: She has no wheezes. She has no rales.  Musculoskeletal: She exhibits no edema.  Patient has variceal veins on her lower upper leg.  No real tenderness.  There is a bruised area medially.  No real tenderness.  Slight edema bilaterally on both lower legs.  No erythema.  Skin: Skin is warm.     ED Treatments / Results  Labs (all labs ordered are listed, but only abnormal results are displayed) Labs Reviewed - No data to display  EKG  EKG Interpretation None       Radiology No results found.  Procedures Procedures (including  critical care time)  Medications Ordered in ED Medications - No data to display   Initial Impression / Assessment and Plan / ED Course  I have reviewed the triage vital signs and the nursing notes.  Pertinent labs & imaging results that were available during my care of the patient were reviewed by me and considered in my medical decision making (see chart for details).     Patient with leg pain.  Worried about DVT.  Negative Doppler.  Also had episode of chest tightness upon discharge.  EKG reassuring.  Doubt cardiac cause.  May be an anxiety component.  Will discharge home. Final Clinical Impressions(s) / ED Diagnoses   Final diagnoses:  Left leg pain    New Prescriptions Discharge Medication List as of 11/27/2016  2:58 PM       Benjiman Core, MD 11/27/16 1700

## 2016-11-27 NOTE — Progress Notes (Signed)
Preliminary results by tech - Left Lower Ext. Venous Duplex Completed. Negative for deep and superficial vein thrombosis in the left leg. Daimien Patmon, BS, RDMS, RVT  

## 2016-11-27 NOTE — Telephone Encounter (Signed)
Patient was scheduled for Saturday clinic. [facility] was diagnosed with superficial blood clot earlier this week. Patient told them that she is now having swelling and pain down her leg. After speaking with Dr. Dallas Schimkeopeland about this I called the patient and advised to go to ED. I explained that we did not have resources to rule out blood clot. Patient verbalized understanding and said that she would go to the ED.

## 2016-11-30 ENCOUNTER — Other Ambulatory Visit (INDEPENDENT_AMBULATORY_CARE_PROVIDER_SITE_OTHER): Payer: BLUE CROSS/BLUE SHIELD

## 2016-11-30 ENCOUNTER — Ambulatory Visit (INDEPENDENT_AMBULATORY_CARE_PROVIDER_SITE_OTHER): Payer: BLUE CROSS/BLUE SHIELD | Admitting: Internal Medicine

## 2016-11-30 ENCOUNTER — Encounter: Payer: Self-pay | Admitting: Internal Medicine

## 2016-11-30 VITALS — BP 124/80 | HR 73 | Temp 98.3°F | Ht 65.0 in | Wt 351.0 lb

## 2016-11-30 DIAGNOSIS — G4452 New daily persistent headache (NDPH): Secondary | ICD-10-CM | POA: Insufficient documentation

## 2016-11-30 LAB — SEDIMENTATION RATE: SED RATE: 23 mm/h — AB (ref 0–20)

## 2016-11-30 MED ORDER — FLUCONAZOLE 150 MG PO TABS: 150.0000 mg | ORAL_TABLET | Freq: Once | ORAL | 0 refills | Status: AC

## 2016-11-30 MED ORDER — AMOXICILLIN-POT CLAVULANATE 875-125 MG PO TABS: 1.0000 | ORAL_TABLET | Freq: Two times a day (BID) | ORAL | 0 refills | Status: DC

## 2016-11-30 NOTE — Assessment & Plan Note (Signed)
Checking ESR to rule out temporal arteritis. Suspect sinusitis is the cause and treating with augmentin.

## 2016-11-30 NOTE — Progress Notes (Signed)
   Subjective:    Patient ID: [name], female    DOB: 1970-05-26, 46 y.o.   MRN: [id number]  HPI The patient is a 46 YO female coming in for headache going on for 1 week or so. She thought it was sinus problems but it has not gotten better. She is still taking her allergy medication. She is having nose drainage and some throat drainage. She denies cough or SOB. She denies fevers or chills. She is concerned about the headache. She has tried tylenol and excedrin which did not help the headache. She denies injury or fall. No vision changes.  Review of Systems  Constitutional: Positive for activity change and appetite change. Negative for chills, fatigue, fever and unexpected weight change.  HENT: Positive for congestion, ear discharge, ear pain, postnasal drip, rhinorrhea, sinus pressure and sinus pain. Negative for sneezing, sore throat, tinnitus, trouble swallowing and voice change.   Eyes: Negative.   Respiratory: Negative for cough, chest tightness, shortness of breath and wheezing.   Cardiovascular: Negative.   Gastrointestinal: Negative.   Neurological: Positive for headaches.      Objective:   Physical Exam  Constitutional: She is oriented to person, place, and time. She appears well-developed and well-nourished.  HENT:  Head: Normocephalic and atraumatic.  Oropharynx with redness and tonsillar swelling, no exudate. Nose with crusting. Sinus pressure frontal and temporal.   Eyes: EOM are normal.  Neck: Normal range of motion.  Cardiovascular: Normal rate and regular rhythm.  Pulmonary/Chest: Effort normal and breath sounds normal. No respiratory distress. She has no wheezes. She has no rales.  Abdominal: Soft. Bowel sounds are normal. She exhibits no distension. There is no tenderness. There is no rebound.  Musculoskeletal: She exhibits tenderness. She exhibits no edema.  Some pain on the temporal region bilaterally  Lymphadenopathy:    She has no cervical adenopathy.    Neurological: She is alert and oriented to person, place, and time. Coordination normal.  Skin: Skin is warm and dry.   Vitals:   11/30/16 0950  BP: 124/80  Pulse: 73  Temp: 98.3 F (36.8 C)  TempSrc: Oral  SpO2: 100%  Weight: (!) 351 lb (159.2 kg)  Height: 5\' 5"  (1.651 m)      Assessment & Plan:

## 2016-11-30 NOTE — Patient Instructions (Signed)
We are checking the blood work today.  We have sent in augmentin to take 1 pill twice a day for 10 days to clear the sinuses.

## 2016-12-01 ENCOUNTER — Encounter: Payer: Self-pay | Admitting: Internal Medicine

## 2017-01-06 ENCOUNTER — Ambulatory Visit: Payer: BLUE CROSS/BLUE SHIELD | Admitting: Internal Medicine

## 2017-01-06 ENCOUNTER — Encounter: Payer: Self-pay | Admitting: Internal Medicine

## 2017-01-06 DIAGNOSIS — M79604 Pain in right leg: Secondary | ICD-10-CM

## 2017-01-06 NOTE — Progress Notes (Signed)
   Subjective:    Patient ID: [name], female    DOB: [birth year], 46 y.o.   MRN: [id number]  HPI The patient is a 46 YO female coming in for right leg pain with fall. She was going up 3 steps and fell into the top step landing on her shin. She was able to walk after the fall and denies hitting her head. She denies LOC. No fevers or chills. Denies dizziness or lightheadedness. No redness on the leg. She used tylenol and ice for the area and has been on her feet and walking on it. She does have some 3/10 pain. Overall getting some better since it happened about 1-2 weeks ago.   Review of Systems  Constitutional: Positive for activity change. Negative for appetite change, chills, fatigue, fever and unexpected weight change.  Respiratory: Negative.   Cardiovascular: Negative.   Gastrointestinal: Negative.   Musculoskeletal: Positive for arthralgias and myalgias. Negative for back pain, gait problem and joint swelling.  Skin: Negative.   Neurological: Negative.       Objective:   Physical Exam  Constitutional: She is oriented to person, place, and time. She appears well-developed and well-nourished.  HENT:  Head: Normocephalic and atraumatic.  Eyes: EOM are normal.  Neck: Normal range of motion.  Cardiovascular: Normal rate and regular rhythm.  Pulmonary/Chest: Effort normal and breath sounds normal. No respiratory distress. She has no wheezes. She has no rales.  Abdominal: Soft. Bowel sounds are normal. She exhibits no distension. There is no tenderness. There is no rebound.  Musculoskeletal: She exhibits no edema.  Small bruising on the right shin, bones intact, able to bear weight and ROM in foot normal as well as the knee unchanged from prior. No swelling, calf tenderness or rash on the skin  Neurological: She is alert and oriented to person, place, and time. Coordination normal.  Skin: Skin is warm and dry.  Psychiatric: She has a normal mood and affect.   Vitals:   01/06/17 1606  BP: 120/82  Pulse: 61  Temp: 98 F (36.7 C)  TempSrc: Oral  SpO2: 100%  Weight: (!) 356 lb (161.5 kg)  Height: 5\' 5"  (1.651 m)      Assessment & Plan:

## 2017-01-06 NOTE — Patient Instructions (Signed)
You did not break anything and no blood clot.   You can use tylenol or ice on the area for pain.  If you notice more swelling, red rash or fevers call the office as this could be a very rare complication.

## 2017-01-07 ENCOUNTER — Encounter: Payer: Self-pay | Admitting: Internal Medicine

## 2017-01-07 DIAGNOSIS — M79604 Pain in right leg: Secondary | ICD-10-CM | POA: Insufficient documentation

## 2017-01-07 NOTE — Assessment & Plan Note (Signed)
She will use ice and otc pain medications. Offered refill of tramadol for pain. She was just relieved that no bones are broken. Suspect bruising and possible sprain. Continue with activity as tolerated.

## 2017-01-24 ENCOUNTER — Encounter: Payer: Self-pay | Admitting: Internal Medicine

## 2017-01-27 MED ORDER — TRAMADOL HCL 50 MG PO TABS: 50.0000 mg | ORAL_TABLET | Freq: Every evening | ORAL | 3 refills | Status: DC | PRN

## 2017-02-02 ENCOUNTER — Other Ambulatory Visit: Payer: Self-pay | Admitting: Internal Medicine

## 2017-02-12 ENCOUNTER — Other Ambulatory Visit: Payer: Self-pay | Admitting: Allergy and Immunology

## 2017-02-12 DIAGNOSIS — J4540 Moderate persistent asthma, uncomplicated: Secondary | ICD-10-CM

## 2017-02-12 DIAGNOSIS — J3089 Other allergic rhinitis: Secondary | ICD-10-CM

## 2017-04-01 ENCOUNTER — Encounter: Payer: Self-pay | Admitting: Internal Medicine

## 2017-04-01 ENCOUNTER — Ambulatory Visit: Payer: BLUE CROSS/BLUE SHIELD | Admitting: Internal Medicine

## 2017-04-01 DIAGNOSIS — J320 Chronic maxillary sinusitis: Secondary | ICD-10-CM

## 2017-04-01 MED ORDER — FLUCONAZOLE 150 MG PO TABS: 150.0000 mg | ORAL_TABLET | ORAL | 0 refills | Status: DC

## 2017-04-01 MED ORDER — AMOXICILLIN-POT CLAVULANATE 875-125 MG PO TABS: 1.0000 | ORAL_TABLET | Freq: Two times a day (BID) | ORAL | 0 refills | Status: DC

## 2017-04-01 MED ORDER — PHENTERMINE HCL 37.5 MG PO CAPS: 37.5000 mg | ORAL_CAPSULE | ORAL | 2 refills | Status: DC

## 2017-04-01 NOTE — Progress Notes (Signed)
   Subjective:    Patient ID: [name], female    DOB: November 27, 1970, 47 y.o.   MRN: [id number]  HPI The patient is a 47 YO female coming in for right ear and sinus pain for the last month or so. Overall worsening. Some hearing changes in the right ear. She has been taking her singulair, zyrtec regularly. Having headaches still. Denies fevers but some chills over the month. She denies cough or SOB. Taking sudafed which provides temporary relief.   Review of Systems  Constitutional: Positive for activity change, appetite change and chills. Negative for fatigue, fever and unexpected weight change.  HENT: Positive for congestion, postnasal drip, rhinorrhea and sinus pressure. Negative for ear discharge, ear pain, sinus pain, sneezing, sore throat, tinnitus, trouble swallowing and voice change.   Eyes: Negative.   Respiratory: Positive for cough. Negative for chest tightness, shortness of breath and wheezing.   Cardiovascular: Negative.   Gastrointestinal: Negative.   Musculoskeletal: Positive for myalgias.  Neurological: Negative.       Objective:   Physical Exam  Constitutional: She is oriented to person, place, and time. She appears well-developed and well-nourished.  HENT:  Head: Normocephalic and atraumatic.  Oropharynx with redness and clear drainage, nose with swollen turbinates, TMs normal bilaterally. Frontal sinus tenderness  Eyes: EOM are normal.  Neck: Normal range of motion. No thyromegaly present.  Cardiovascular: Normal rate and regular rhythm.  Pulmonary/Chest: Effort normal and breath sounds normal. No respiratory distress. She has no wheezes. She has no rales.  Abdominal: Soft.  Musculoskeletal: She exhibits tenderness.  Lymphadenopathy:    She has no cervical adenopathy.  Neurological: She is alert and oriented to person, place, and time.  Skin: Skin is warm and dry.   Vitals:   04/01/17 1535  BP: 140/90  Pulse: (!) 55  Temp: 98.3 F (36.8 C)  TempSrc:  Oral  SpO2: 99%  Weight: (!) 355 lb (161 kg)  Height: 5\' 5"  (1.651 m)      Assessment & Plan:

## 2017-04-01 NOTE — Patient Instructions (Signed)
We have sent in the augmentin to take 1 pill twice a day for 2 weeks.  We have sent in the phentermine to take 1 pill daily. Come back in 3 months.

## 2017-04-02 NOTE — Assessment & Plan Note (Signed)
Acute on chronic sinusitis today. Rx for augmentin 2 weeks and diflucan in case of yeast infection. Continue dymista, zyrtec, singulair.

## 2017-04-05 ENCOUNTER — Encounter: Payer: Self-pay | Admitting: Internal Medicine

## 2017-04-06 MED ORDER — RANITIDINE HCL 300 MG PO TABS: 300.0000 mg | ORAL_TABLET | Freq: Every day | ORAL | 6 refills | Status: DC

## 2017-04-18 ENCOUNTER — Ambulatory Visit: Payer: BLUE CROSS/BLUE SHIELD | Admitting: Allergy and Immunology

## 2017-04-18 ENCOUNTER — Encounter: Payer: Self-pay | Admitting: Allergy and Immunology

## 2017-04-18 VITALS — BP 132/88 | HR 68 | Resp 18 | Ht 64.0 in | Wt 339.2 lb

## 2017-04-18 DIAGNOSIS — J3089 Other allergic rhinitis: Secondary | ICD-10-CM

## 2017-04-18 DIAGNOSIS — J019 Acute sinusitis, unspecified: Secondary | ICD-10-CM | POA: Insufficient documentation

## 2017-04-18 DIAGNOSIS — J4540 Moderate persistent asthma, uncomplicated: Secondary | ICD-10-CM

## 2017-04-18 DIAGNOSIS — J0100 Acute maxillary sinusitis, unspecified: Secondary | ICD-10-CM | POA: Diagnosis not present

## 2017-04-18 MED ORDER — FLUTICASONE PROPIONATE 93 MCG/ACT NA EXHU: 2.0000 | INHALANT_SUSPENSION | Freq: Two times a day (BID) | NASAL | 5 refills | Status: DC

## 2017-04-18 MED ORDER — PREDNISONE 1 MG PO TABS: 10.0000 mg | ORAL_TABLET | Freq: Every day | ORAL | Status: DC

## 2017-04-18 MED ORDER — MONTELUKAST SODIUM 10 MG PO TABS: 10.0000 mg | ORAL_TABLET | Freq: Every day | ORAL | 5 refills | Status: DC

## 2017-04-18 NOTE — Patient Instructions (Signed)
Acute sinusitis  Prednisone has been provided, 40 mg x3 days, 20 mg x1 day, 10 mg x1 day, then stop.  [facility], 2 actuations per nostril twice a day. Proper technique has been discussed and demonstrated.  Nasal saline lavage (NeilMed) has been recommended as needed and prior to medicated nasal sprays along with instructions for proper administration.  The patient has been asked to contact me if her symptoms persist, progress, or if she becomes febrile. Otherwise, she may return for follow up in 4 months.  Moderate persistent asthma Well-controlled .  Continue Flovent 110 g, 2 inhalations via spacer device daily., montelukast 10 mg daily at bedtime, and albuterol every 6 hours if needed.  During respiratory tract infections or asthma flares, increase Flovent 110g to 3 inhalations 3 times per day until symptoms have returned to baseline.  Subjective and objective measures of pulmonary function will be followed and the treatment plan will be adjusted accordingly.  Perennial and seasonal allergic rhinitis  Continue appropriate allergen avoidance measures.  Treatment plan as outlined above for acute sinusitis.  If allergen avoidance measures and medications fail to adequately relieve symptoms, aeroallergen immunotherapy will be considered.   Return in about 4 months (around 08/18/2017), or if symptoms worsen or fail to improve.

## 2017-04-18 NOTE — Assessment & Plan Note (Signed)
Well-controlled .  Continue Flovent 110 g, 2 inhalations via spacer device daily., montelukast 10 mg daily at bedtime, and albuterol every 6 hours if needed.  During respiratory tract infections or asthma flares, increase Flovent 110g to 3 inhalations 3 times per day until symptoms have returned to baseline.  Subjective and objective measures of pulmonary function will be followed and the treatment plan will be adjusted accordingly.

## 2017-04-18 NOTE — Assessment & Plan Note (Signed)
   Prednisone has been provided, 40 mg x3 days, 20 mg x1 day, 10 mg x1 day, then stop.  [facility] 2 actuations per nostril twice a day. Proper technique has been discussed and demonstrated.  Nasal saline lavage (NeilMed) has been recommended as needed and prior to medicated nasal sprays along with instructions for proper administration.  The patient has been asked to contact me if her symptoms persist, progress, or if she becomes febrile. Otherwise, she may return for follow up in 4 months.

## 2017-04-18 NOTE — Assessment & Plan Note (Signed)
   Continue appropriate allergen avoidance measures.  Treatment plan as outlined above for acute sinusitis.  If allergen avoidance measures and medications fail to adequately relieve symptoms, aeroallergen immunotherapy will be considered. 

## 2017-04-18 NOTE — Progress Notes (Signed)
Follow-up Note  RE: [name] MRN: [id number] DOB: Jan 28, 1970 Date of Office Visit: 04/18/2017  Primary care provider: Myrlene Broker, MD Referring provider: Myrlene Broker, *  History of present illness: [name] is a 47 y.o. female with persistent asthma and allergic rhinitis presenting today for a sick visit.  She was last seen in this clinic on October 19, 2016.  She reports that her asthma has been well controlled in the interval since her previous visit while taking Flovent 110 g, 2 inhalations via spacer device daily and montelukast 10 mg daily bedtime.  While on this regimen, she rarely requires albuterol rescue and denies limitations in daily activities and nocturnal awakenings due to lower respiratory symptoms.  However, over the past month she has been experiencing sinus pressure, ear pressure, nasal congestion, and postnasal drainage.  She took a course of Augmentin approximately 3 weeks ago without perceived benefit.  She denies fevers, chills, and discolored mucus production.  Assessment and plan: Acute sinusitis  Prednisone has been provided, 40 mg x3 days, 20 mg x1 day, 10 mg x1 day, then stop.  A prescription has been provided for [facility], 2 actuations per nostril twice a day. Proper technique has been discussed and demonstrated.  Nasal saline lavage (NeilMed) has been recommended as needed and prior to medicated nasal sprays along with instructions for proper administration.  The patient has been asked to contact me if her symptoms persist, progress, or if she becomes febrile. Otherwise, she may return for follow up in 4 months.  Moderate persistent asthma Well-controlled .  Continue Flovent 110 g, 2 inhalations via spacer device daily., montelukast 10 mg daily at bedtime, and albuterol every 6 hours if needed.  During respiratory tract infections or asthma flares, increase Flovent 110g to 3 inhalations 3 times per day until symptoms  have returned to baseline.  Subjective and objective measures of pulmonary function will be followed and the treatment plan will be adjusted accordingly.  Perennial and seasonal allergic rhinitis  Continue appropriate allergen avoidance measures.  Treatment plan as outlined above for acute sinusitis.  If allergen avoidance measures and medications fail to adequately relieve symptoms, aeroallergen immunotherapy will be considered.   Meds ordered this encounter  Medications  . montelukast (SINGULAIR) 10 MG tablet    Sig: Take 1 tablet (10 mg total) by mouth at bedtime.    Dispense:  30 tablet    Refill:  5  . Fluticasone Propionate (XHANCE) 93 MCG/ACT EXHU    Sig: Place 2 sprays into both nostrils 2 (two) times daily.    Dispense:  32 mL    Refill:  5    [phone]  . predniSONE (DELTASONE) tablet 10 mg    Diagnostics: Spirometry:  Normal with an FEV1 of 97% predicted.  Please see scanned spirometry results for details.    Physical examination: Blood pressure 132/88, pulse 68, resp. rate 18, height 5\' 4"  (1.626 m), weight (!) 339 lb 3.2 oz (153.9 kg), SpO2 98 %.  General: Alert, interactive, in no acute distress. HEENT: TMs pearly gray, turbinates edematous with thick discharge, post-pharynx moderately erythematous. Neck: Supple without lymphadenopathy. Lungs: Clear to auscultation without wheezing, rhonchi or rales. CV: Normal S1, S2 without murmurs. Skin: Warm and dry, without lesions or rashes.  The following portions of the patient's history were reviewed and updated as appropriate: allergies, current medications, past family history, past medical history, past social history, past surgical history and problem list.  Allergies as of  04/18/2017   No Known Allergies     Medication List        Accurate as of 04/18/17  9:08 PM. Always use your most recent med list.          PROAIR HFA 108 (90 Base) MCG/ACT inhaler Generic drug:  albuterol Inhale 2 puffs into the  lungs every 6 (six) hours as needed for wheezing or shortness of breath.   albuterol 108 (90 Base) MCG/ACT inhaler Commonly known as:  PROVENTIL HFA;VENTOLIN HFA Inhale 1 puff into the lungs every 6 (six) hours as needed for wheezing or shortness of breath.   ALPRAZolam 0.25 MG tablet Commonly known as:  XANAX take 1 tablet by mouth once daily if needed for anxiety   amoxicillin-clavulanate 875-125 MG tablet Commonly known as:  AUGMENTIN Take 1 tablet by mouth 2 (two) times daily.   Azelastine-Fluticasone 137-50 MCG/ACT Susp Place 1 spray into the nose 2 (two) times daily as needed.   beclomethasone 80 MCG/ACT inhaler Commonly known as:  QVAR Inhale 2 puffs into the lungs 2 (two) times daily.   buPROPion 150 MG 24 hr tablet Commonly known as:  WELLBUTRIN XL TAKE ONE TABLET BY MOUTH ONCE DAILY   cetirizine 10 MG tablet Commonly known as:  ZYRTEC Take 10 mg by mouth daily.   cyanocobalamin 1000 MCG/ML injection Commonly known as:  (VITAMIN B-12) INJECT 1 ML INTO THE MUSCLE EVERY 30 DAYS   Diclofenac Sodium 2 % Soln Commonly known as:  PENNSAID Place 2 application onto the skin 2 (two) times daily.   fluconazole 150 MG tablet Commonly known as:  DIFLUCAN Take 1 tablet (150 mg total) by mouth every 3 (three) days.   fluticasone 110 MCG/ACT inhaler Commonly known as:  FLOVENT HFA Inhale 2 puffs into the lungs 2 (two) times daily.   fluticasone 50 MCG/ACT nasal spray Commonly known as:  FLONASE Place 1 spray into both nostrils daily.   Fluticasone Propionate 93 MCG/ACT Exhu Commonly known as:  XHANCE Place 2 sprays into both nostrils 2 (two) times daily.   furosemide 20 MG tablet Commonly known as:  LASIX Take 1 tablet (20 mg total) by mouth daily.   hyoscyamine 0.125 MG SL tablet Commonly known as:  LEVSIN SL DISSOLVE ONE TABLET IN MOUTH EVERY 4 HOURS IF  NEEDED   meloxicam 7.5 MG tablet Commonly known as:  MOBIC Take 1 tablet (7.5 mg total) by mouth daily.    metoprolol tartrate 50 MG tablet Commonly known as:  LOPRESSOR TAKE 1/2 (ONE-HALF) TABLET BY MOUTH TWICE DAILY   montelukast 10 MG tablet Commonly known as:  SINGULAIR Take 1 tablet (10 mg total) by mouth at bedtime.   oxybutynin 10 MG 24 hr tablet Commonly known as:  DITROPAN-XL Take 10 mg by mouth at bedtime.   phenazopyridine 100 MG tablet Commonly known as:  PYRIDIUM Take 1 tablet (100 mg total) by mouth 3 (three) times daily as needed for pain (with food).   phentermine 37.5 MG capsule Take 1 capsule (37.5 mg total) by mouth every morning.   ranitidine 300 MG tablet Commonly known as:  ZANTAC Take 1 tablet (300 mg total) by mouth at bedtime.   sodium chloride 0.65 % Soln nasal spray Commonly known as:  OCEAN Place 1 spray into both nostrils as needed for congestion.   traMADol 50 MG tablet Commonly known as:  ULTRAM Take 1 tablet (50 mg total) by mouth at bedtime as needed.       No Known  Allergies  Review of systems: Review of systems negative except as noted in HPI / PMHx or noted below: Constitutional: Negative.  HENT: Negative.   Eyes: Negative.  Respiratory: Negative.   Cardiovascular: Negative.  Gastrointestinal: Negative.  Genitourinary: Negative.  Musculoskeletal: Negative.  Neurological: Negative.  Endo/Heme/Allergies: Negative.  Cutaneous: Negative.  Past Medical History:  Diagnosis Date  . Anemia   . Asthma   . Bulging disc   . Hypertension   . Obesity   . PONV (postoperative nausea and vomiting)    PT IS [place]    Family History  Problem Relation Age of Onset  . Breast cancer Mother   . Hypertension Sister   . Asthma Brother   . Allergic rhinitis Neg Hx   . Eczema Neg Hx   . Immunodeficiency Neg Hx   . Urticaria Neg Hx   . Angioedema Neg Hx     Social History   Socioeconomic History  . Marital status: Divorced    Spouse name: Not on file  . Number of children: Not on file  . Years of education: Not on file  .  Highest education level: Not on file  Occupational History  . Not on file  Social Needs  . Financial resource strain: Not on file  . Food insecurity:    Worry: Not on file    Inability: Not on file  . Transportation needs:    Medical: Not on file    Non-medical: Not on file  Tobacco Use  . Smoking status: Never Smoker  . Smokeless tobacco: Never Used  Substance and Sexual Activity  . Alcohol use: No  . Drug use: No  . Sexual activity: Not on file  Lifestyle  . Physical activity:    Days per week: Not on file    Minutes per session: Not on file  . Stress: Not on file  Relationships  . Social connections:    Talks on phone: Not on file    Gets together: Not on file    Attends religious service: Not on file    Active member of club or organization: Not on file    Attends meetings of clubs or organizations: Not on file    Relationship status: Not on file  . Intimate partner violence:    Fear of current or ex partner: Not on file    Emotionally abused: Not on file    Physically abused: Not on file    Forced sexual activity: Not on file  Other Topics Concern  . Not on file  Social History Narrative  . Not on file    I appreciate the opportunity to take part in [facility] care. Please do not hesitate to contact me with questions.  Sincerely,   R. Jorene Guest, MD

## 2017-04-20 ENCOUNTER — Encounter: Payer: Self-pay | Admitting: Internal Medicine

## 2017-04-21 ENCOUNTER — Encounter: Payer: Self-pay | Admitting: Allergy and Immunology

## 2017-04-27 ENCOUNTER — Telehealth: Payer: Self-pay | Admitting: *Deleted

## 2017-04-27 NOTE — Telephone Encounter (Signed)
Patient has [employer], Dr. Nunzio CobbsBobbitt would you like to send in fluticasone instead?

## 2017-04-27 NOTE — Telephone Encounter (Signed)
error 

## 2017-04-28 NOTE — Telephone Encounter (Signed)
Patient is calling back about her xhance not being covered by her insurance. Also patient is wondering if her qvar will be sent in.  Please Advise

## 2017-04-29 ENCOUNTER — Telehealth: Payer: Self-pay | Admitting: *Deleted

## 2017-04-29 DIAGNOSIS — J0100 Acute maxillary sinusitis, unspecified: Secondary | ICD-10-CM

## 2017-04-29 DIAGNOSIS — J3089 Other allergic rhinitis: Secondary | ICD-10-CM

## 2017-04-29 MED ORDER — FLUTICASONE PROPIONATE 93 MCG/ACT NA EXHU: 2.0000 | INHALANT_SUSPENSION | Freq: Two times a day (BID) | NASAL | 12 refills | Status: DC

## 2017-04-29 NOTE — Telephone Encounter (Signed)
Pt stated she spoke with xhance pharmacy and it was still costing her $100, I reordered the med with 12 month refills as it was ordered incorrectly the first time and have her opt into the 12 month auto refills and it should be free for the whole year. She can then contact the [facility] pharmacy and confirm.

## 2017-05-01 ENCOUNTER — Encounter: Payer: Self-pay | Admitting: Internal Medicine

## 2017-05-01 DIAGNOSIS — G4452 New daily persistent headache (NDPH): Secondary | ICD-10-CM

## 2017-05-16 ENCOUNTER — Other Ambulatory Visit: Payer: Self-pay | Admitting: Allergy and Immunology

## 2017-05-22 ENCOUNTER — Ambulatory Visit
Admission: RE | Admit: 2017-05-22 | Discharge: 2017-05-22 | Disposition: A | Discharge status: Home / Self Care | Payer: BLUE CROSS/BLUE SHIELD | Source: Ambulatory Visit | Attending: Internal Medicine | Admitting: Internal Medicine

## 2017-05-22 DIAGNOSIS — G4452 New daily persistent headache (NDPH): Secondary | ICD-10-CM

## 2017-05-23 ENCOUNTER — Telehealth: Payer: Self-pay

## 2017-05-23 ENCOUNTER — Encounter: Payer: Self-pay | Admitting: Internal Medicine

## 2017-05-23 NOTE — Telephone Encounter (Signed)
Pt's insurance is not covering [place]. She has the Obama healthcare plan but will be starting a new job tomorrow with new benefits. She states she will lower her dose to 1 spray BID so her sample will last longer. Advised pt to bring new insurance card then we can resubmit rx. Does this sound OK with you?

## 2017-05-23 NOTE — Telephone Encounter (Signed)
Yes, that sounds fine.  Thanks.

## 2017-05-31 ENCOUNTER — Encounter: Payer: Self-pay | Admitting: Internal Medicine

## 2017-06-01 MED ORDER — SUMATRIPTAN SUCCINATE 50 MG PO TABS: 50.0000 mg | ORAL_TABLET | ORAL | 0 refills | Status: AC | PRN

## 2017-07-24 ENCOUNTER — Encounter: Payer: Self-pay | Admitting: Internal Medicine

## 2017-08-10 ENCOUNTER — Encounter: Payer: Self-pay | Admitting: Internal Medicine

## 2017-08-22 ENCOUNTER — Ambulatory Visit: Payer: BLUE CROSS/BLUE SHIELD | Admitting: Allergy and Immunology

## 2017-09-17 ENCOUNTER — Ambulatory Visit: Payer: BC Managed Care – PPO | Admitting: Family Medicine

## 2017-09-17 ENCOUNTER — Encounter: Payer: Self-pay | Admitting: Family Medicine

## 2017-09-17 VITALS — BP 142/88 | HR 70 | Temp 98.2°F | Ht 64.0 in | Wt 319.0 lb

## 2017-09-17 DIAGNOSIS — J320 Chronic maxillary sinusitis: Secondary | ICD-10-CM | POA: Diagnosis not present

## 2017-09-17 MED ORDER — CEFDINIR 300 MG PO CAPS: 300.0000 mg | ORAL_CAPSULE | Freq: Two times a day (BID) | ORAL | 0 refills | Status: DC

## 2017-09-17 MED ORDER — PREDNISONE 5 MG PO TABS: ORAL_TABLET | ORAL | 0 refills | Status: DC

## 2017-09-17 MED ORDER — FLUCONAZOLE 150 MG PO TABS: ORAL_TABLET | ORAL | 0 refills | Status: DC

## 2017-09-17 NOTE — Progress Notes (Signed)
[name] is a 47 y.o. female here for an acute visit.  History of Present Illness:   Sinusitis  This is a chronic problem. The current episode started 1 to 4 weeks ago. The problem has been gradually worsening since onset. There has been no fever. The pain is mild. Associated symptoms include chills, congestion and sinus pressure. Pertinent negatives include no coughing, diaphoresis, ear pain, headaches, neck pain, shortness of breath, sneezing, sore throat or swollen glands. Past treatments include nothing.   PMHx, SurgHx, SocialHx, Medications, and Allergies were reviewed in the Visit Navigator and updated as appropriate.  Current Medications:   .  albuterol (PROAIR HFA) 108 (90 Base) MCG/ACT inhaler, Inhale 2 puffs into the lungs every 6 (six) hours as needed for wheezing or shortness of breath., Disp: , Rfl:  .  albuterol (PROVENTIL HFA;VENTOLIN HFA) 108 (90 Base) MCG/ACT inhaler, INHALE 1 PUFF BY MOUTH EVERY 6 HOURS AS NEEDED FOR WHEEZING OR SHORTNESS OF BREATH, Disp: 18 g, Rfl: 0 .  ALPRAZolam (XANAX) 0.25 MG tablet, take 1 tablet by mouth once daily if needed for anxiety, Disp: 30 tablet, Rfl: 2 .  cetirizine (ZYRTEC) 10 MG tablet, Take 10 mg by mouth daily., Disp: , Rfl:  .  cyanocobalamin (,VITAMIN B-12,) 1000 MCG/ML injection, INJECT 1 ML INTO THE MUSCLE EVERY 30 DAYS, Disp: 1 mL, Rfl: 11 .  Diclofenac Sodium (PENNSAID) 2 % SOLN, Place 2 application onto the skin 2 (two) times daily., Disp: 112 g, Rfl: 3 .  fluticasone (FLONASE) 50 MCG/ACT nasal spray, Place 1 spray into both nostrils daily., Disp: 16 g, Rfl: 0 .  fluticasone (FLOVENT HFA) 110 MCG/ACT inhaler, Inhale 2 puffs into the lungs 2 (two) times daily., Disp: 1 Inhaler, Rfl: 5 .  Fluticasone Propionate (XHANCE) 93 MCG/ACT EXHU, Place 2 sprays into both nostrils 2 (two) times daily., Disp: 32 mL, Rfl: 12 .  furosemide (LASIX) 20 MG tablet, Take 1 tablet (20 mg total) by mouth daily., Disp: 30 tablet, Rfl: 3 .   hyoscyamine (LEVSIN SL) 0.125 MG SL tablet, DISSOLVE ONE TABLET IN MOUTH EVERY 4 HOURS IF  NEEDED, Disp: 30 tablet, Rfl: 2 .  meloxicam (MOBIC) 7.5 MG tablet, Take 1 tablet (7.5 mg total) by mouth daily., Disp: 90 tablet, Rfl: 1 .  metoprolol tartrate (LOPRESSOR) 50 MG tablet, TAKE 1/2 (ONE-HALF) TABLET BY MOUTH TWICE DAILY, Disp: 30 tablet, Rfl: 3 .  montelukast (SINGULAIR) 10 MG tablet, Take 1 tablet (10 mg total) by mouth at bedtime., Disp: 30 tablet, Rfl: 5 .  oxybutynin (DITROPAN-XL) 10 MG 24 hr tablet, Take 10 mg by mouth at bedtime., Disp: , Rfl:  .  phenazopyridine (PYRIDIUM) 100 MG tablet, Take 1 tablet (100 mg total) by mouth 3 (three) times daily as needed for pain (with food)., Disp: 10 tablet, Rfl: 0 .  phentermine 37.5 MG capsule, Take 1 capsule (37.5 mg total) by mouth every morning., Disp: 30 capsule, Rfl: 2 .  ranitidine (ZANTAC) 300 MG tablet, Take 1 tablet (300 mg total) by mouth at bedtime., Disp: 30 tablet, Rfl: 6 .  sodium chloride (OCEAN) 0.65 % SOLN nasal spray, Place 1 spray into both nostrils as needed for congestion., Disp: 480 mL, Rfl: 0 .  SUMAtriptan (IMITREX) 50 MG tablet, Take 1 tablet (50 mg total) by mouth every 2 (two) hours as needed for migraine. May repeat in 2 hours if headache persists or recurs., Disp: 10 tablet, Rfl: 0 .  traMADol (ULTRAM) 50 MG tablet, Take 1  tablet (50 mg total) by mouth at bedtime as needed., Disp: 30 tablet, Rfl: 3 .  buPROPion (WELLBUTRIN XL) 150 MG 24 hr tablet, TAKE ONE TABLET BY MOUTH ONCE DAILY (Patient not taking: Reported on 09/17/2017), Disp: 90 tablet, Rfl: 1 .  cefdinir (OMNICEF) 300 MG capsule, Take 1 capsule (300 mg total) by mouth 2 (two) times daily., Disp: 20 capsule, Rfl: 0 .  fluconazole (DIFLUCAN) 150 MG tablet, ONE PO X 1, AGAIN 3 DAYS LATER IF NEEDED, Disp: 2 tablet, Rfl: 0 .  predniSONE (DELTASONE) 5 MG tablet, 6-5-4-3-2-1-off, Disp: 21 tablet, Rfl: 0   No Known Allergies   Review of Systems:   Pertinent items are  noted in the HPI. Otherwise, ROS is negative.  Vitals:   Vitals:   09/17/17 1010  BP: (!) 142/88  Pulse: 70  Temp: 98.2 F (36.8 C)  TempSrc: Oral  SpO2: 98%  Weight: (!) 319 lb (144.7 kg)  Height: 5\' 4"  (1.626 m)     Body mass index is 54.76 kg/m.  Physical Exam:   Physical Exam  Constitutional: She appears well-nourished.  HENT:  Head: Normocephalic and atraumatic.  Nose: Left sinus exhibits maxillary sinus tenderness and frontal sinus tenderness.  Eyes: Pupils are equal, round, and reactive to light. EOM are normal.  Neck: Normal range of motion. Neck supple.  Cardiovascular: Normal rate, regular rhythm, normal heart sounds and intact distal pulses.  Pulmonary/Chest: Effort normal.  Abdominal: Soft.  Skin: Skin is warm.  Psychiatric: She has a normal mood and affect. Her behavior is normal.  Nursing note and vitals reviewed.  Assessment and Plan:   Diagnoses and all orders for this visit:  Chronic maxillary sinusitis -     cefdinir (OMNICEF) 300 MG capsule; Take 1 capsule (300 mg total) by mouth 2 (two) times daily. -     predniSONE (DELTASONE) 5 MG tablet; 6-5-4-3-2-1-off -     fluconazole (DIFLUCAN) 150 MG tablet; ONE PO X 1, AGAIN 3 DAYS LATER IF NEEDED   . Reviewed expectations re: course of current medical issues. . Discussed self-management of symptoms. . Outlined signs and symptoms indicating need for more acute intervention. . Patient verbalized understanding and all questions were answered. Marland Kitchen. Health Maintenance issues including appropriate healthy diet, exercise, and smoking avoidance were discussed with patient. . See orders for this visit as documented in the electronic medical record. . Patient received an After Visit Summary.   Helane RimaErica Jesicca Dipierro, DO [place], [place] [facility] 09/17/2017

## 2017-09-20 ENCOUNTER — Encounter: Payer: Self-pay | Admitting: Internal Medicine

## 2017-09-21 ENCOUNTER — Telehealth: Payer: Self-pay

## 2017-09-21 MED ORDER — PHENTERMINE-TOPIRAMATE ER 7.5-46 MG PO CP24: 1.0000 | ORAL_CAPSULE | Freq: Every day | ORAL | 0 refills | Status: DC

## 2017-09-21 NOTE — Telephone Encounter (Signed)
PA started on CoverMyMeds ZOX:[id number]:[id number]

## 2017-09-22 NOTE — Telephone Encounter (Signed)
Offer choices to patient, any would be appropriate. We can send in any.

## 2017-09-22 NOTE — Telephone Encounter (Signed)
PA for Qsyima is denied patient needs to try Saxenda, belviq, or belviq XR

## 2017-10-03 ENCOUNTER — Encounter: Payer: Self-pay | Admitting: Internal Medicine

## 2017-10-04 MED ORDER — LORCASERIN HCL ER 20 MG PO TB24: 20.0000 mg | ORAL_TABLET | Freq: Every day | ORAL | 1 refills | Status: DC

## 2017-10-05 ENCOUNTER — Telehealth: Payer: Self-pay

## 2017-10-05 NOTE — Telephone Encounter (Signed)
PA started on CoverMyMeds KEY: [id number] Approved 10/05/2017-01/04/2018

## 2017-10-15 ENCOUNTER — Other Ambulatory Visit: Payer: Self-pay | Admitting: Internal Medicine

## 2017-10-17 ENCOUNTER — Telehealth: Payer: Self-pay | Admitting: Internal Medicine

## 2017-10-17 NOTE — Telephone Encounter (Unsigned)
Copied from CRM [id number]. Topic: Quick Communication - Rx Refill/Question >> Oct 17, 2017  1:08 PM Mcneil, Ja-Kwan wrote: Medication: metoprolol tartrate (LOPRESSOR) 50 MG tablet  Has the patient contacted their pharmacy? yes   Preferred Pharmacy (with phone number or street name): [facility] [address]- [place], [place] - [address] [phone] (Phone) [phone] (Fax)  Agent: Please be advised that RX refills may take up to 3 business days. We ask that you follow-up with your pharmacy.

## 2017-10-17 NOTE — Telephone Encounter (Signed)
Left VM to alert pt refill is available at preferred pharmacy

## 2017-10-31 ENCOUNTER — Encounter: Payer: Self-pay | Admitting: Internal Medicine

## 2017-10-31 ENCOUNTER — Encounter: Payer: Self-pay | Admitting: Allergy and Immunology

## 2017-10-31 ENCOUNTER — Ambulatory Visit: Payer: BC Managed Care – PPO | Admitting: Allergy and Immunology

## 2017-10-31 ENCOUNTER — Ambulatory Visit (INDEPENDENT_AMBULATORY_CARE_PROVIDER_SITE_OTHER): Payer: BC Managed Care – PPO | Admitting: Internal Medicine

## 2017-10-31 ENCOUNTER — Other Ambulatory Visit (INDEPENDENT_AMBULATORY_CARE_PROVIDER_SITE_OTHER): Payer: BC Managed Care – PPO

## 2017-10-31 VITALS — BP 126/88 | HR 52 | Temp 98.3°F | Ht 64.0 in | Wt 320.0 lb

## 2017-10-31 VITALS — BP 140/88 | HR 72 | Resp 16

## 2017-10-31 DIAGNOSIS — K2100 Gastro-esophageal reflux disease with esophagitis, without bleeding: Secondary | ICD-10-CM

## 2017-10-31 DIAGNOSIS — Z0000 Encounter for general adult medical examination without abnormal findings: Secondary | ICD-10-CM

## 2017-10-31 DIAGNOSIS — J4540 Moderate persistent asthma, uncomplicated: Secondary | ICD-10-CM

## 2017-10-31 DIAGNOSIS — J4530 Mild persistent asthma, uncomplicated: Secondary | ICD-10-CM | POA: Diagnosis not present

## 2017-10-31 DIAGNOSIS — J3089 Other allergic rhinitis: Secondary | ICD-10-CM

## 2017-10-31 DIAGNOSIS — I10 Essential (primary) hypertension: Secondary | ICD-10-CM | POA: Diagnosis not present

## 2017-10-31 DIAGNOSIS — Z23 Encounter for immunization: Secondary | ICD-10-CM

## 2017-10-31 DIAGNOSIS — J320 Chronic maxillary sinusitis: Secondary | ICD-10-CM | POA: Diagnosis not present

## 2017-10-31 DIAGNOSIS — Z9884 Bariatric surgery status: Secondary | ICD-10-CM

## 2017-10-31 LAB — COMPREHENSIVE METABOLIC PANEL
ALBUMIN: 3.4 g/dL — AB (ref 3.5–5.2)
ALT: 10 U/L (ref 0–35)
AST: 17 U/L (ref 0–37)
Alkaline Phosphatase: 89 U/L (ref 39–117)
BUN: 12 mg/dL (ref 6–23)
CHLORIDE: 108 meq/L (ref 96–112)
CO2: 29 mEq/L (ref 19–32)
Calcium: 8.4 mg/dL (ref 8.4–10.5)
Creatinine, Ser: 0.54 mg/dL (ref 0.40–1.20)
GFR: 129.07 mL/min (ref >60.00)
Glucose, Bld: 80 mg/dL (ref 70–99)
POTASSIUM: 3.8 meq/L (ref 3.5–5.1)
Sodium: 141 mEq/L (ref 135–145)
Total Bilirubin: 0.3 mg/dL (ref 0.2–1.2)
Total Protein: 5.8 g/dL — ABNORMAL LOW (ref 6.0–8.3)

## 2017-10-31 LAB — CBC
HEMATOCRIT: 31.5 % — AB (ref 36.0–46.0)
Hemoglobin: 10.1 g/dL — ABNORMAL LOW (ref 12.0–15.0)
MCHC: 32 g/dL (ref 30.0–36.0)
MCV: 76.9 fl — AB (ref 78.0–100.0)
Platelets: 242 10*3/uL (ref 150.0–400.0)
RBC: 4.1 Mil/uL (ref 3.87–5.11)
RDW: 15.3 % (ref 11.5–15.5)
WBC: 6.6 10*3/uL (ref 4.0–10.5)

## 2017-10-31 LAB — VITAMIN B12: Vitamin B-12: 246 pg/mL (ref 211–911)

## 2017-10-31 LAB — LIPID PANEL
Cholesterol: 111 mg/dL (ref 0–200)
HDL: 52.3 mg/dL (ref >39.00)
LDL CALC: 50 mg/dL (ref 0–99)
NonHDL: 58.52
Total CHOL/HDL Ratio: 2
Triglycerides: 42 mg/dL (ref 0.0–149.0)
VLDL: 8.4 mg/dL (ref 0.0–40.0)

## 2017-10-31 LAB — T4, FREE: FREE T4: 0.92 ng/dL (ref 0.60–1.60)

## 2017-10-31 LAB — HEMOGLOBIN A1C: HEMOGLOBIN A1C: 5.5 % (ref 4.6–6.5)

## 2017-10-31 LAB — TSH: TSH: 1.29 u[IU]/mL (ref 0.35–4.50)

## 2017-10-31 LAB — VITAMIN D 25 HYDROXY (VIT D DEFICIENCY, FRACTURES): VITD: 18.17 ng/mL — AB (ref 30.00–100.00)

## 2017-10-31 LAB — FERRITIN: Ferritin: 2.6 ng/mL — ABNORMAL LOW (ref 10.0–291.0)

## 2017-10-31 MED ORDER — ALBUTEROL SULFATE HFA 108 (90 BASE) MCG/ACT IN AERS: 2.0000 | INHALATION_SPRAY | Freq: Four times a day (QID) | RESPIRATORY_TRACT | 0 refills | Status: DC | PRN

## 2017-10-31 MED ORDER — AZELASTINE HCL 0.15 % NA SOLN: 2.0000 | Freq: Two times a day (BID) | NASAL | 5 refills | Status: DC

## 2017-10-31 MED ORDER — CLOTRIMAZOLE-BETAMETHASONE 1-0.05 % EX CREA: 1.0000 "application " | TOPICAL_CREAM | Freq: Two times a day (BID) | CUTANEOUS | 11 refills | Status: DC

## 2017-10-31 MED ORDER — MONTELUKAST SODIUM 10 MG PO TABS: 10.0000 mg | ORAL_TABLET | Freq: Every day | ORAL | 5 refills | Status: DC

## 2017-10-31 MED ORDER — FAMOTIDINE 20 MG PO TABS: 20.0000 mg | ORAL_TABLET | Freq: Two times a day (BID) | ORAL | 5 refills | Status: DC

## 2017-10-31 NOTE — Progress Notes (Signed)
Follow-up Note  RE: [name] MRN: [id number] DOB: 1970/07/24 Date of Office Visit: 10/31/2017  Primary care provider: Myrlene Broker, MD Referring provider: Myrlene Broker, *  History of present illness: [name] is a 47 y.o. female with persistent asthma and allergic rhinitis presenting today for follow-up.  She was last seen in this clinic on April 18, 2017.  She reports that her asthma has been well controlled in the interval since her previous visit.  In fact, she held Flovent approximately 6 weeks ago and has only been taking montelukast daily as asthma controller medication.  She rarely requires albuterol rescue and has not been experiencing nocturnal awakenings due to lower respiratory symptoms.  She complains of headaches which she has been experiencing almost daily over the past year.  She localizes the headaches over her forehead and behind her eyes.  She is currently taking Flonase daily in an attempt to control nasal congestion.  Her heartburn has been well controlled with ranitidine daily.  Assessment and plan: Perennial and seasonal allergic rhinitis  A prescription has been provided for azelastine nasal spray, 1-2 sprays per nostril 2 times daily as needed. Proper nasal spray technique has been discussed and demonstrated.   May add fluticasone nasal spray, 2 sprays per nostril daily as needed.  Nasal saline lavage (NeilMed) has been recommended as needed and prior to medicated nasal sprays along with instructions for proper administration.  For thick post nasal drainage, add guaifenesin 1200 mg (Mucinex Maximum Strength)  twice daily as needed with adequate hydration as discussed.  If allergen avoidance measures and medications fail to adequately relieve symptoms, aeroallergen immunotherapy will be considered.  Chronic sinusitis  If the headaches in the sinus areas persist despite treatment plan as outlined above, further evaluation by  otolaryngologist, Dr. Suszanne Conners, may be warranted.  GERD (gastroesophageal reflux disease)  Continue appropriate reflux lifestyle modifications.  Switch from ranitidine (Zantac) to famotidine (Pepcid) 20 mg as needed.  Mild persistent asthma  For now, continue montelukast 10 mg daily at bedtime and albuterol HFA, 1 to 2 inhalations every 4-6 hours if needed..  During respiratory tract infections or asthma flares, add Flovent 110g 3 inhalations via spacer device 3 times per day until symptoms have returned to baseline.  Subjective and objective measures of pulmonary function will be followed and the treatment plan will be adjusted accordingly.   Meds ordered this encounter  Medications  . Azelastine HCl 0.15 % SOLN    Sig: Place 2 sprays into both nostrils 2 (two) times daily.    Dispense:  30 mL    Refill:  5  . famotidine (PEPCID) 20 MG tablet    Sig: Take 1 tablet (20 mg total) by mouth 2 (two) times daily.    Dispense:  30 tablet    Refill:  5  . montelukast (SINGULAIR) 10 MG tablet    Sig: Take 1 tablet (10 mg total) by mouth at bedtime.    Dispense:  30 tablet    Refill:  5  . albuterol (PROAIR HFA) 108 (90 Base) MCG/ACT inhaler    Sig: Inhale 2 puffs into the lungs every 6 (six) hours as needed for wheezing or shortness of breath.    Dispense:  18 g    Refill:  0    Diagnostics: Spirometry:  Normal with an FEV1 of 93% predicted.  Please see scanned spirometry results for details.    Physical examination: Blood pressure 140/88, pulse 72, resp. rate  16.  General: Alert, interactive, in no acute distress. HEENT: TMs pearly gray, turbinates moderately edematous with thick discharge, post-pharynx moderately erythematous. Neck: Supple without lymphadenopathy. Lungs: Clear to auscultation without wheezing, rhonchi or rales. CV: Normal S1, S2 without murmurs. Skin: Warm and dry, without lesions or rashes.  The following portions of the patient's history were reviewed and  updated as appropriate: allergies, current medications, past family history, past medical history, past social history, past surgical history and problem list.  Allergies as of 10/31/2017   No Known Allergies     Medication List        Accurate as of 10/31/17 11:45 PM. Always use your most recent med list.          albuterol 108 (90 Base) MCG/ACT inhaler Commonly known as:  PROVENTIL HFA;VENTOLIN HFA INHALE 1 PUFF BY MOUTH EVERY 6 HOURS AS NEEDED FOR WHEEZING OR SHORTNESS OF BREATH   albuterol 108 (90 Base) MCG/ACT inhaler Commonly known as:  PROVENTIL HFA;VENTOLIN HFA Inhale 2 puffs into the lungs every 6 (six) hours as needed for wheezing or shortness of breath.   ALPRAZolam 0.25 MG tablet Commonly known as:  XANAX take 1 tablet by mouth once daily if needed for anxiety   Azelastine HCl 0.15 % Soln Place 2 sprays into both nostrils 2 (two) times daily.   cetirizine 10 MG tablet Commonly known as:  ZYRTEC Take 10 mg by mouth daily.   clotrimazole-betamethasone cream Commonly known as:  LOTRISONE Apply 1 application topically 2 (two) times daily.   cyanocobalamin 1000 MCG/ML injection Commonly known as:  (VITAMIN B-12) INJECT 1 ML INTO THE MUSCLE EVERY 30 DAYS   Diclofenac Sodium 2 % Soln Place 2 application onto the skin 2 (two) times daily.   famotidine 20 MG tablet Commonly known as:  PEPCID Take 1 tablet (20 mg total) by mouth 2 (two) times daily.   fluconazole 150 MG tablet Commonly known as:  DIFLUCAN ONE PO X 1, AGAIN 3 DAYS LATER IF NEEDED   fluticasone 110 MCG/ACT inhaler Commonly known as:  FLOVENT HFA Inhale 2 puffs into the lungs 2 (two) times daily.   Fluticasone Propionate 93 MCG/ACT Exhu Place 2 sprays into both nostrils 2 (two) times daily.   furosemide 20 MG tablet Commonly known as:  LASIX Take 1 tablet (20 mg total) by mouth daily.   hyoscyamine 0.125 MG SL tablet Commonly known as:  LEVSIN SL DISSOLVE ONE TABLET IN MOUTH EVERY 4 HOURS  IF  NEEDED   metoprolol tartrate 50 MG tablet Commonly known as:  LOPRESSOR TAKE 1/2 (ONE-HALF) TABLET BY MOUTH TWICE DAILY   montelukast 10 MG tablet Commonly known as:  SINGULAIR Take 1 tablet (10 mg total) by mouth at bedtime.   oxybutynin 10 MG 24 hr tablet Commonly known as:  DITROPAN-XL Take 10 mg by mouth at bedtime.   sodium chloride 0.65 % Soln nasal spray Commonly known as:  OCEAN Place 1 spray into both nostrils as needed for congestion.   SUMAtriptan 50 MG tablet Commonly known as:  IMITREX Take 1 tablet (50 mg total) by mouth every 2 (two) hours as needed for migraine. May repeat in 2 hours if headache persists or recurs.   traMADol 50 MG tablet Commonly known as:  ULTRAM Take 1 tablet (50 mg total) by mouth at bedtime as needed.       No Known Allergies  Review of systems: Review of systems negative except as noted in HPI / PMHx or noted below:  Constitutional: Negative.  HENT: Negative.   Eyes: Negative.  Respiratory: Negative.   Cardiovascular: Negative.  Gastrointestinal: Negative.  Genitourinary: Negative.  Musculoskeletal: Negative.  Neurological: Negative.  Endo/Heme/Allergies: Negative.  Cutaneous: Negative.  Past Medical History:  Diagnosis Date  . Anemia   . Asthma   . Bulging disc   . Hypertension   . Obesity   . PONV (postoperative nausea and vomiting)    PT IS [employer]    Family History  Problem Relation Age of Onset  . Breast cancer Mother   . Hypertension Sister   . Asthma Brother   . Allergic rhinitis Neg Hx   . Eczema Neg Hx   . Immunodeficiency Neg Hx   . Urticaria Neg Hx   . Angioedema Neg Hx     Social History   Socioeconomic History  . Marital status: Divorced    Spouse name: Not on file  . Number of children: Not on file  . Years of education: Not on file  . Highest education level: Not on file  Occupational History  . Not on file  Social Needs  . Financial resource strain: Not on file  . Food  insecurity:    Worry: Not on file    Inability: Not on file  . Transportation needs:    Medical: Not on file    Non-medical: Not on file  Tobacco Use  . Smoking status: Never Smoker  . Smokeless tobacco: Never Used  Substance and Sexual Activity  . Alcohol use: No  . Drug use: No  . Sexual activity: Not on file  Lifestyle  . Physical activity:    Days per week: Not on file    Minutes per session: Not on file  . Stress: Not on file  Relationships  . Social connections:    Talks on phone: Not on file    Gets together: Not on file    Attends religious service: Not on file    Active member of club or organization: Not on file    Attends meetings of clubs or organizations: Not on file    Relationship status: Not on file  . Intimate partner violence:    Fear of current or ex partner: Not on file    Emotionally abused: Not on file    Physically abused: Not on file    Forced sexual activity: Not on file  Other Topics Concern  . Not on file  Social History Narrative  . Not on file    I appreciate the opportunity to take part in [facility] care. Please do not hesitate to contact me with questions.  Sincerely,   R. Jorene Guest, MD

## 2017-10-31 NOTE — Progress Notes (Signed)
   Subjective:    Patient ID: [name], female    DOB: 09/15/1970, 47 y.o.   MRN: [id number]  HPI The patient is a 47 YO female coming in for physical.   PMH, [facility], social history reviewed and updated.   Review of Systems  Constitutional: Negative.   HENT: Negative.   Eyes: Negative.   Respiratory: Negative for cough, chest tightness and shortness of breath.   Cardiovascular: Negative for chest pain, palpitations and leg swelling.  Gastrointestinal: Negative for abdominal distention, abdominal pain, constipation, diarrhea, nausea and vomiting.  Musculoskeletal: Negative.   Skin: Negative.   Neurological: Negative.   Psychiatric/Behavioral: Negative.       Objective:   Physical Exam  Constitutional: She is oriented to person, place, and time. She appears well-developed and well-nourished.  overweight  HENT:  Head: Normocephalic and atraumatic.  Eyes: EOM are normal.  Neck: Normal range of motion.  Cardiovascular: Normal rate and regular rhythm.  Pulmonary/Chest: Effort normal and breath sounds normal. No respiratory distress. She has no wheezes. She has no rales.  Abdominal: Soft. Bowel sounds are normal. She exhibits no distension. There is no tenderness. There is no rebound.  Skin folds with some color changes but no skin breakdown or abscess.   Musculoskeletal: She exhibits no edema.  Neurological: She is alert and oriented to person, place, and time. Coordination normal.  Skin: Skin is warm and dry.  Psychiatric: She has a normal mood and affect.   Vitals:   10/31/17 1259  BP: 126/88  Pulse: (!) 52  Temp: 98.3 F (36.8 C)  TempSrc: Oral  SpO2: 99%  Weight: (!) 320 lb (145.2 kg)  Height: 5\' 4"  (1.626 m)      Assessment & Plan:  Flu shot given at visit

## 2017-10-31 NOTE — Assessment & Plan Note (Addendum)
   A prescription has been provided for azelastine nasal spray, 1-2 sprays per nostril 2 times daily as needed. Proper nasal spray technique has been discussed and demonstrated.   May add fluticasone nasal spray, 2 sprays per nostril daily as needed.  Nasal saline lavage (NeilMed) has been recommended as needed and prior to medicated nasal sprays along with instructions for proper administration.  For thick post nasal drainage, add guaifenesin 1200 mg (Mucinex Maximum Strength)  twice daily as needed with adequate hydration as discussed.  If allergen avoidance measures and medications fail to adequately relieve symptoms, aeroallergen immunotherapy will be considered.

## 2017-10-31 NOTE — Assessment & Plan Note (Signed)
   If the headaches in the sinus areas persist despite treatment plan as outlined above, further evaluation by otolaryngologist, Dr. Suszanne Conners, may be warranted.

## 2017-10-31 NOTE — Patient Instructions (Addendum)

## 2017-10-31 NOTE — Assessment & Plan Note (Signed)
   For now, continue montelukast 10 mg daily at bedtime and albuterol HFA, 1 to 2 inhalations every 4-6 hours if needed..  During respiratory tract infections or asthma flares, add Flovent 110g 3 inhalations via spacer device 3 times per day until symptoms have returned to baseline.  Subjective and objective measures of pulmonary function will be followed and the treatment plan will be adjusted accordingly.

## 2017-10-31 NOTE — Patient Instructions (Addendum)
Perennial and seasonal allergic rhinitis  A prescription has been provided for azelastine nasal spray, 1-2 sprays per nostril 2 times daily as needed. Proper nasal spray technique has been discussed and demonstrated.   May add fluticasone nasal spray, 2 sprays per nostril daily as needed.  Nasal saline lavage (NeilMed) has been recommended as needed and prior to medicated nasal sprays along with instructions for proper administration.  For thick post nasal drainage, add guaifenesin 1200 mg (Mucinex Maximum Strength)  twice daily as needed with adequate hydration as discussed.  If allergen avoidance measures and medications fail to adequately relieve symptoms, aeroallergen immunotherapy will be considered.  Chronic sinusitis  If the headaches in the sinus areas persist despite treatment plan as outlined above, further evaluation by otolaryngologist, Dr. Suszanne Conners, may be warranted.  GERD (gastroesophageal reflux disease)  Continue appropriate reflux lifestyle modifications.  Switch from ranitidine (Zantac) to famotidine (Pepcid) 20 mg as needed.  Mild persistent asthma  For now, continue montelukast 10 mg daily at bedtime and albuterol HFA, 1 to 2 inhalations every 4-6 hours if needed..  During respiratory tract infections or asthma flares, add Flovent 110g 3 inhalations via spacer device 3 times per day until symptoms have returned to baseline.  Subjective and objective measures of pulmonary function will be followed and the treatment plan will be adjusted accordingly.   Return in about 4 months (around 03/03/2018), or if symptoms worsen or fail to improve.

## 2017-10-31 NOTE — Assessment & Plan Note (Signed)
   Continue appropriate reflux lifestyle modifications.  Switch from ranitidine (Zantac) to famotidine (Pepcid) 20 mg as needed.

## 2017-11-02 ENCOUNTER — Other Ambulatory Visit: Payer: Self-pay | Admitting: Internal Medicine

## 2017-11-02 MED ORDER — VITAMIN D (ERGOCALCIFEROL) 1.25 MG (50000 UNIT) PO CAPS: 50000.0000 [IU] | ORAL_CAPSULE | ORAL | 0 refills | Status: DC

## 2017-11-03 ENCOUNTER — Encounter: Payer: Self-pay | Admitting: Internal Medicine

## 2017-11-03 NOTE — Assessment & Plan Note (Signed)
Flu shot given. Pneumonia not indicated. Shingrix counseled. Tetanus up to date. Colonoscopy not indicated. Mammogram up to date with gyn, pap smear up to date with gyn. Counseled about sun safety and mole surveillance. Counseled about the dangers of distracted driving. Given 10 year screening recommendations.

## 2017-11-03 NOTE — Assessment & Plan Note (Signed)
Weight is increasing some in the last few years and her arthritis is severe causing her to not be able to exercise. Complicated by GERD, hypertension and arthritis. Hx bypass surgery.

## 2017-11-03 NOTE — Assessment & Plan Note (Signed)
BP at goal on her lasix and metoprolol. Checking CMP and adjust as needed.  

## 2017-11-03 NOTE — Assessment & Plan Note (Signed)
Needs monitoring of vitamin levels due to past bypass. She is taking multivitamin daily.

## 2017-12-20 ENCOUNTER — Encounter: Payer: Self-pay | Admitting: Internal Medicine

## 2017-12-20 MED ORDER — DOXYCYCLINE HYCLATE 100 MG PO TABS: 100.0000 mg | ORAL_TABLET | Freq: Two times a day (BID) | ORAL | 0 refills | Status: DC

## 2018-02-03 ENCOUNTER — Encounter: Payer: Self-pay | Admitting: Internal Medicine

## 2018-02-03 MED ORDER — OXYBUTYNIN CHLORIDE ER 10 MG PO TB24: 10.0000 mg | ORAL_TABLET | Freq: Every day | ORAL | 1 refills | Status: DC

## 2018-03-03 ENCOUNTER — Other Ambulatory Visit: Payer: Self-pay | Admitting: Internal Medicine

## 2018-03-13 ENCOUNTER — Encounter: Payer: Self-pay | Admitting: Internal Medicine

## 2018-03-13 MED ORDER — "SYRINGE 25G X 1-1/2"" 3 ML MISC": 1 refills | Status: AC

## 2018-04-12 ENCOUNTER — Ambulatory Visit: Payer: BC Managed Care – PPO | Admitting: Internal Medicine

## 2018-04-13 ENCOUNTER — Encounter: Payer: Self-pay | Admitting: Internal Medicine

## 2018-04-13 ENCOUNTER — Telehealth: Payer: Self-pay

## 2018-04-13 ENCOUNTER — Other Ambulatory Visit: Payer: Self-pay

## 2018-04-13 ENCOUNTER — Ambulatory Visit: Payer: BC Managed Care – PPO | Admitting: Internal Medicine

## 2018-04-13 DIAGNOSIS — H9391 Unspecified disorder of right ear: Secondary | ICD-10-CM

## 2018-04-13 DIAGNOSIS — H9390 Unspecified disorder of ear, unspecified ear: Secondary | ICD-10-CM | POA: Insufficient documentation

## 2018-04-13 NOTE — Patient Instructions (Addendum)
The spot on the ear is a bone spur and not harmful.

## 2018-04-13 NOTE — Progress Notes (Signed)
   Subjective:   Patient ID: [name], female    DOB: [birth year], 48 y.o.   MRN: [id number]  HPI The patient is a 48 YO female coming in for lump by her ear. She noticed this Monday while she was talking on the phone. She denies pain in the area. No swelling or redness. Overall it is not growing since Monday. Denies fevers or chills. Has not tried anything for it.   Review of Systems  Constitutional: Negative.   HENT: Negative.   Eyes: Negative.   Respiratory: Negative for cough, chest tightness and shortness of breath.   Cardiovascular: Negative for chest pain, palpitations and leg swelling.  Gastrointestinal: Negative for abdominal distention, abdominal pain, constipation, diarrhea, nausea and vomiting.  Musculoskeletal: Negative.   Skin: Negative.        lump  Neurological: Negative.   Psychiatric/Behavioral: Negative.     Objective:  Physical Exam Constitutional:      Appearance: She is well-developed.  HENT:     Head: Normocephalic and atraumatic.     Comments: Bony prominence pre-auricular region which is not abnormal Neck:     Musculoskeletal: Normal range of motion.  Cardiovascular:     Rate and Rhythm: Normal rate and regular rhythm.  Pulmonary:     Effort: Pulmonary effort is normal. No respiratory distress.     Breath sounds: Normal breath sounds. No wheezing or rales.  Abdominal:     General: Bowel sounds are normal. There is no distension.     Palpations: Abdomen is soft.     Tenderness: There is no abdominal tenderness. There is no rebound.  Skin:    General: Skin is warm and dry.  Neurological:     Mental Status: She is alert and oriented to person, place, and time.     Coordination: Coordination normal.     Vitals:   04/13/18 0757  BP: 138/78  Pulse: 60  Temp: 98.4 F (36.9 C)  TempSrc: Oral  SpO2: 99%  Weight: (!) 306 lb (138.8 kg)  Height: 5\' 4"  (1.626 m)    Assessment & Plan:

## 2018-04-13 NOTE — Assessment & Plan Note (Signed)
The lump on her face is a natural bony prominence and is not worrisome and does not need any further monitoring or imaging.

## 2018-04-13 NOTE — Telephone Encounter (Signed)
Copied from CRM [id number]. Topic: General - Inquiry >> Apr 13, 2018  8:18 AM Crist Infante wrote: Reason for CRM: pt needs note for work saying she had appt today this morning, and was seen in your office. Pt going back to work now, but thinks she may need. Pt would like put in mychart.

## 2018-04-13 NOTE — Telephone Encounter (Signed)
Letter has been written and sent to patients mychart

## 2018-04-24 ENCOUNTER — Other Ambulatory Visit: Payer: Self-pay | Admitting: Allergy and Immunology

## 2018-05-11 ENCOUNTER — Other Ambulatory Visit: Payer: Self-pay

## 2018-05-11 ENCOUNTER — Ambulatory Visit (INDEPENDENT_AMBULATORY_CARE_PROVIDER_SITE_OTHER): Payer: BC Managed Care – PPO | Admitting: Internal Medicine

## 2018-05-11 ENCOUNTER — Encounter: Payer: Self-pay | Admitting: Internal Medicine

## 2018-05-11 DIAGNOSIS — R109 Unspecified abdominal pain: Secondary | ICD-10-CM | POA: Diagnosis not present

## 2018-05-11 MED ORDER — CYCLOBENZAPRINE HCL 10 MG PO TABS: 10.0000 mg | ORAL_TABLET | Freq: Three times a day (TID) | ORAL | 0 refills | Status: DC | PRN

## 2018-05-11 NOTE — Progress Notes (Signed)
Virtual Visit via Video Note  I connected with [name] on 05/11/18 at  3:40 PM EDT by a video enabled telemedicine application and verified that I am speaking with the correct person using two identifiers.   I discussed the limitations of evaluation and management by telemedicine and the availability of in person appointments. The patient expressed understanding and agreed to proceed.  History of Present Illness: The patient is a 48 y.o. female with visit for pain in the right side and upper stomach. Prior gallbladder removal. Started 1 day ago. Has pain going through chest to back. Denies pain in the left chest. Denies cough or SOB. Was doing some increased activity prior to onset but did not think she pulled something. She was mixing up a large batch of pimento cheese. Denies relief of pain with tylenol or ibuprofen. Pain is 8/10 at times and 5/10 at rest. Overall it is stable but not worsening since onset. Has tried ibuprofen and tylenol and heat without relief. Does not feel that this is heartburn related. Not changed by diet or eating. Denies nausea or vomiting or diarrhea or constipation.   Observations/Objective: Appearance: normal, breathing appears normal, casual grooming, abdomen does not appear distended, throat normal, pain located on the right flank and lower right rib cage, worse when she is bending, memory normal, mental status is A and O times 3  Assessment and Plan: See problem oriented charting  Follow Up Instructions: rx for flexeril, likely musculoskeletal  I discussed the assessment and treatment plan with the patient. The patient was provided an opportunity to ask questions and all were answered. The patient agreed with the plan and demonstrated an understanding of the instructions.   The patient was advised to call back or seek an in-person evaluation if the symptoms worsen or if the condition fails to improve as anticipated.  Myrlene Broker, MD

## 2018-05-12 ENCOUNTER — Encounter: Payer: Self-pay | Admitting: Internal Medicine

## 2018-05-12 DIAGNOSIS — R109 Unspecified abdominal pain: Secondary | ICD-10-CM | POA: Insufficient documentation

## 2018-05-12 NOTE — Assessment & Plan Note (Signed)
Prior cholecystectomy and does not sound typical of diverticulitis or kidney stones. Does not sound like coronavirus. Advised of symptoms of this to monitor for. Sounds to be musculoskeletal with stirring large mixture could have strained muscle. Rx for flexeril to try for pain.

## 2018-05-28 ENCOUNTER — Encounter: Payer: Self-pay | Admitting: Internal Medicine

## 2018-05-29 MED ORDER — ORLISTAT 120 MG PO CAPS: 120.0000 mg | ORAL_CAPSULE | Freq: Three times a day (TID) | ORAL | 3 refills | Status: DC

## 2018-06-07 ENCOUNTER — Encounter: Payer: Self-pay | Admitting: Internal Medicine

## 2018-06-08 MED ORDER — OMEPRAZOLE 40 MG PO CPDR: 40.0000 mg | DELAYED_RELEASE_CAPSULE | Freq: Every day | ORAL | 3 refills | Status: DC

## 2018-06-08 MED ORDER — HYOSCYAMINE SULFATE 0.125 MG SL SUBL: SUBLINGUAL_TABLET | SUBLINGUAL | 2 refills | Status: DC

## 2018-06-16 ENCOUNTER — Encounter: Payer: Self-pay | Admitting: Internal Medicine

## 2018-06-16 ENCOUNTER — Telehealth: Payer: Self-pay

## 2018-06-16 NOTE — Telephone Encounter (Signed)
PA started on CoverMyMeds KEY: [id number]

## 2018-06-16 NOTE — Telephone Encounter (Signed)
Will do PA on cover my meds

## 2018-06-16 NOTE — Telephone Encounter (Signed)
Copied from CRM [id number]. Topic: General - Other >> Jun 16, 2018  9:55 AM Dalphine Handing A wrote: Linna Caprice from patients insurance company called about a PA on the Xenical for patient that she inquired about on 05-29-2018. Hawa would like a callback from Dr. Okey Dupre at [phone]

## 2018-06-16 NOTE — Telephone Encounter (Signed)
PA approved 06/16/2018 - 06/16/2019

## 2018-07-08 ENCOUNTER — Encounter: Payer: Self-pay | Admitting: Internal Medicine

## 2018-07-10 ENCOUNTER — Other Ambulatory Visit: Payer: Self-pay | Admitting: Allergy and Immunology

## 2018-07-10 NOTE — Telephone Encounter (Signed)
Hey Milly can we send in a refill on the patients singulair. She made a telephone visit for 6/23. She is under quarantine due to her job at [employer].   Thaks

## 2018-07-18 ENCOUNTER — Other Ambulatory Visit: Payer: Self-pay

## 2018-07-18 ENCOUNTER — Ambulatory Visit (INDEPENDENT_AMBULATORY_CARE_PROVIDER_SITE_OTHER): Payer: BC Managed Care – PPO | Admitting: Allergy and Immunology

## 2018-07-18 ENCOUNTER — Encounter: Payer: Self-pay | Admitting: Allergy and Immunology

## 2018-07-18 DIAGNOSIS — K219 Gastro-esophageal reflux disease without esophagitis: Secondary | ICD-10-CM

## 2018-07-18 DIAGNOSIS — J4530 Mild persistent asthma, uncomplicated: Secondary | ICD-10-CM

## 2018-07-18 DIAGNOSIS — J3089 Other allergic rhinitis: Secondary | ICD-10-CM

## 2018-07-18 MED ORDER — MONTELUKAST SODIUM 10 MG PO TABS: 10.0000 mg | ORAL_TABLET | Freq: Every day | ORAL | 5 refills | Status: DC

## 2018-07-18 NOTE — Patient Instructions (Addendum)
Mild persistent asthma Well-controlled.  Continue montelukast 10 mg daily at bedtime and albuterol HFA, 1 to 2 inhalations every 4-6 hours if needed..  The montelukast boxed warning has been discussed and the patient has verbalized understanding.  During respiratory tract infections or asthma flares, add Flovent 110g 3 inhalations via spacer device 3 times per day until symptoms have returned to baseline.  Subjective and objective measures of pulmonary function will be followed and the treatment plan will be adjusted accordingly.  Perennial and seasonal allergic rhinitis Currently with suboptimal control.  Continue appropriate allergen avoidance measures.  I have recommended increasing the azelastine nasal spray to 1 to 2 sprays per nostril twice daily for now.  Add Xhance nasal spray, 2 actuations per nostril twice daily for now.  Nasal saline lavage (NeilMed) has been recommended as needed and prior to medicated nasal sprays along with instructions for proper administration.  For thick post nasal drainage, nasal congestion, and/or sinus pressure, add guaifenesin 1200 mg (Mucinex Maximum Strength) plus/minus pseudoephedrine 120 mg  twice daily as needed with adequate hydration as discussed. Pseudoephedrine is only to be used for short-term relief of nasal/sinus congestion. Long-term use is discouraged due to potential side effects.  If allergen avoidance measures and medications fail to adequately relieve symptoms, aeroallergen immunotherapy will be considered.  GERD (gastroesophageal reflux disease) Currently well controlled.  Continue appropriate reflux lifestyle modifications and omeprazole as prescribed.   Return in about 4 months (around 11/17/2018), or if symptoms worsen or fail to improve.

## 2018-07-18 NOTE — Assessment & Plan Note (Signed)
Currently well controlled.  Continue appropriate reflux lifestyle modifications and omeprazole as prescribed. 

## 2018-07-18 NOTE — Assessment & Plan Note (Signed)
Well-controlled.  Continue montelukast 10 mg daily at bedtime and albuterol HFA, 1 to 2 inhalations every 4-6 hours if needed..  The montelukast boxed warning has been discussed and the patient has verbalized understanding.  During respiratory tract infections or asthma flares, add Flovent 110g 3 inhalations via spacer device 3 times per day until symptoms have returned to baseline.  Subjective and objective measures of pulmonary function will be followed and the treatment plan will be adjusted accordingly.

## 2018-07-18 NOTE — Assessment & Plan Note (Signed)
Currently with suboptimal control.  Continue appropriate allergen avoidance measures.  I have recommended increasing the azelastine nasal spray to 1 to 2 sprays per nostril twice daily for now.  Add Xhance nasal spray, 2 actuations per nostril twice daily for now.  Nasal saline lavage (NeilMed) has been recommended as needed and prior to medicated nasal sprays along with instructions for proper administration.  For thick post nasal drainage, nasal congestion, and/or sinus pressure, add guaifenesin 1200 mg (Mucinex Maximum Strength) plus/minus pseudoephedrine 120 mg  twice daily as needed with adequate hydration as discussed. Pseudoephedrine is only to be used for short-term relief of nasal/sinus congestion. Long-term use is discouraged due to potential side effects.  If allergen avoidance measures and medications fail to adequately relieve symptoms, aeroallergen immunotherapy will be considered.

## 2018-07-18 NOTE — Progress Notes (Signed)
Follow-up Telemedicine Note  RE: [name] MRN: [id number] DOB: 1970/05/02 Date of Telemedicine Visit: 07/18/2018  Primary care provider: Hoyt Koch, MD Referring provider: Hoyt Koch, *  Telemedicine Follow Up Visit via Telephone: I connected with [name] for a follow up on 07/18/18 by telephone and verified that I am speaking with the correct person using two identifiers.   The limitations, risks, security and privacy concerns of performing an evaluation and management service by telemedicine, the availability of in person appointments, and that there may be a patient responsible charge related to this service were discussed. The patient expressed understanding and agreed to proceed.  Patient is at home.  Provider is at the office.  Visit start time: 10:43 AM Visit end time: 11:02 AM Insurance consent/check in by: [facility] Medical consent and medical assistant/nurse: Loma Sousa  History of present illness: [name] is a 48 y.o. female with persistent asthma and allergic rhinitis presenting today via telemedicine for follow-up.  She was last seen in this clinic in October 2019.  She reports that in the interval since her previous visit her asthma has been well controlled while taking montelukast 10 mg daily.  She believes that she has only required albuterol rescue on one occasion in the interval since her previous visit and that was with exposure to freshly mowed grass.  Her normal daily activities have not been limited nor has she been awakened from sleep due to lower respiratory symptoms. [name] does complain of sinus pressure behind her eyes and below her eyes which "comes and goes."  She started pseudoephedrine on Sunday with mild benefit.  She does not complain of fevers, chills, or discolored mucus production. [name] reports that her reflux was not controlled with famotidine.  Therefore, approximately 2 months ago her primary care  physician prescribed omeprazole which has adequately controlled her reflux symptoms.  Assessment and plan: Mild persistent asthma Well-controlled.  Continue montelukast 10 mg daily at bedtime and albuterol HFA, 1 to 2 inhalations every 4-6 hours if needed..  The montelukast boxed warning has been discussed and the patient has verbalized understanding.  During respiratory tract infections or asthma flares, add Flovent 110g 3 inhalations via spacer device 3 times per day until symptoms have returned to baseline.  Subjective and objective measures of pulmonary function will be followed and the treatment plan will be adjusted accordingly.  Perennial and seasonal allergic rhinitis Currently with suboptimal control.  Continue appropriate allergen avoidance measures.  I have recommended increasing the azelastine nasal spray to 1 to 2 sprays per nostril twice daily for now.  Add Xhance nasal spray, 2 actuations per nostril twice daily for now.  Nasal saline lavage (NeilMed) has been recommended as needed and prior to medicated nasal sprays along with instructions for proper administration.  For thick post nasal drainage, nasal congestion, and/or sinus pressure, add guaifenesin 1200 mg (Mucinex Maximum Strength) plus/minus pseudoephedrine 120 mg  twice daily as needed with adequate hydration as discussed. Pseudoephedrine is only to be used for short-term relief of nasal/sinus congestion. Long-term use is discouraged due to potential side effects.  If allergen avoidance measures and medications fail to adequately relieve symptoms, aeroallergen immunotherapy will be considered.  GERD (gastroesophageal reflux disease) Currently well controlled.  Continue appropriate reflux lifestyle modifications and omeprazole as prescribed.   Meds ordered this encounter  Medications  . montelukast (SINGULAIR) 10 MG tablet    Sig: Take 1 tablet (10 mg total) by mouth at bedtime.  Dispense:  30 tablet     Refill:  5    Diagnostics: None.   Physical examination: Physical Exam Not obtained as encounter was done via telephone.   The following portions of the patient's history were reviewed and updated as appropriate: allergies, current medications, past family history, past medical history, past social history, past surgical history and problem list.  Allergies as of 07/18/2018   No Known Allergies     Medication List       Accurate as of July 18, 2018 11:11 AM. If you have any questions, ask your nurse or doctor.        STOP taking these medications   doxycycline 100 MG tablet Commonly known as: VIBRA-TABS Stopped by: [place] Hampshire Carter Cathlyn Tersigni, MD   famotidine 20 MG tablet Commonly known as: PEPCID Stopped by: Wellington Hampshire Carter Alvia Jablonski, MD   fluconazole 150 MG tablet Commonly known as: DIFLUCAN Stopped by: Wellington Hampshire Carter Blayke Cordrey, MD   Fluticasone Propionate 93 MCG/ACT Exhu Commonly known as: Biomedical engineerXhance Stopped by: Wellington Hampshire Carter Kemiyah Tarazon, MD   orlistat 120 MG capsule Commonly known as: Xenical Stopped by: Wellington Hampshire Carter Alisen Marsiglia, MD   oxybutynin 10 MG 24 hr tablet Commonly known as: DITROPAN-XL Stopped by: Wellington Hampshire Carter Quentina Fronek, MD     TAKE these medications   albuterol 108 (90 Base) MCG/ACT inhaler Commonly known as: ProAir HFA Inhale 2 puffs into the lungs every 6 (six) hours as needed for wheezing or shortness of breath.   ALPRAZolam 0.25 MG tablet Commonly known as: XANAX take 1 tablet by mouth once daily if needed for anxiety   Azelastine HCl 0.15 % Soln Place 2 sprays into both nostrils 2 (two) times daily.   cetirizine 10 MG tablet Commonly known as: ZYRTEC Take 10 mg by mouth daily.   clotrimazole-betamethasone cream Commonly known as: Lotrisone Apply 1 application topically 2 (two) times daily.   cyanocobalamin 1000 MCG/ML injection Commonly known as: (VITAMIN B-12) INJECT 1 ML INTO THE MUSCLE EVERY 30 DAYS   cyclobenzaprine 10 MG tablet Commonly known as: FLEXERIL Take 1 tablet  (10 mg total) by mouth 3 (three) times daily as needed for muscle spasms.   Diclofenac Sodium 2 % Soln Commonly known as: Pennsaid Place 2 application onto the skin 2 (two) times daily.   fluticasone 110 MCG/ACT inhaler Commonly known as: Flovent HFA Inhale 2 puffs into the lungs 2 (two) times daily.   furosemide 20 MG tablet Commonly known as: LASIX Take 1 tablet (20 mg total) by mouth daily.   hyoscyamine 0.125 MG SL tablet Commonly known as: LEVSIN SL DISSOLVE ONE TABLET IN MOUTH EVERY 4 HOURS IF  NEEDED   metoprolol tartrate 50 MG tablet Commonly known as: LOPRESSOR TAKE 1/2 (ONE-HALF) TABLET BY MOUTH TWICE DAILY   montelukast 10 MG tablet Commonly known as: Singulair Take 1 tablet (10 mg total) by mouth at bedtime.   omeprazole 40 MG capsule Commonly known as: PRILOSEC Take 1 capsule (40 mg total) by mouth daily.   sodium chloride 0.65 % Soln nasal spray Commonly known as: OCEAN Place 1 spray into both nostrils as needed for congestion.   SUMAtriptan 50 MG tablet Commonly known as: Imitrex Take 1 tablet (50 mg total) by mouth every 2 (two) hours as needed for migraine. May repeat in 2 hours if headache persists or recurs.   SYRINGE 3CC/25GX1-1/2" 25G X 1-1/2" 3 ML Misc Used to inject Vit B12   traMADol 50 MG tablet Commonly known as: ULTRAM Take 1 tablet (50 mg  total) by mouth at bedtime as needed.   Vitamin D (Ergocalciferol) 1.25 MG (50000 UT) Caps capsule Commonly known as: DRISDOL Take 1 capsule (50,000 Units total) by mouth every 7 (seven) days.       No Known Allergies  Review of systems: Review of systems negative except as noted in HPI / PMHx or noted below: Constitutional: Negative.  HENT: Negative.   Eyes: Negative.  Respiratory: Negative.   Cardiovascular: Negative.  Gastrointestinal: Negative.  Genitourinary: Negative.  Musculoskeletal: Negative.  Neurological: Negative.  Endo/Heme/Allergies: Negative.  Cutaneous: Negative.  Past  Medical History:  Diagnosis Date  . Anemia   . Asthma   . Bulging disc   . Hypertension   . Obesity   . PONV (postoperative nausea and vomiting)    PT IS [place] INDIAN    Family History  Problem Relation Age of Onset  . Breast cancer Mother   . Hypertension Sister   . Asthma Brother   . Allergic rhinitis Neg Hx   . Eczema Neg Hx   . Immunodeficiency Neg Hx   . Urticaria Neg Hx   . Angioedema Neg Hx     Social History   Socioeconomic History  . Marital status: Divorced    Spouse name: Not on file  . Number of children: Not on file  . Years of education: Not on file  . Highest education level: Not on file  Occupational History  . Not on file  Social Needs  . Financial resource strain: Not on file  . Food insecurity    Worry: Not on file    Inability: Not on file  . Transportation needs    Medical: Not on file    Non-medical: Not on file  Tobacco Use  . Smoking status: Never Smoker  . Smokeless tobacco: Never Used  Substance and Sexual Activity  . Alcohol use: No  . Drug use: No  . Sexual activity: Not on file  Lifestyle  . Physical activity    Days per week: Not on file    Minutes per session: Not on file  . Stress: Not on file  Relationships  . Social Musicianconnections    Talks on phone: Not on file    Gets together: Not on file    Attends religious service: Not on file    Active member of club or organization: Not on file    Attends meetings of clubs or organizations: Not on file    Relationship status: Not on file  . Intimate partner violence    Fear of current or ex partner: Not on file    Emotionally abused: Not on file    Physically abused: Not on file    Forced sexual activity: Not on file  Other Topics Concern  . Not on file  Social History Narrative  . Not on file    Previous notes and tests were reviewed.  I discussed the assessment and treatment plan with the patient. The patient was provided an opportunity to ask questions and all were  answered. The patient agreed with the plan and demonstrated an understanding of the instructions.   The patient was advised to call back or seek an in-person evaluation if the symptoms worsen or if the condition fails to improve as anticipated.  I provided 19 minutes of non-face-to-face time during this encounter.  I appreciate the opportunity to take part in [facility]. Please do not hesitate to contact me with questions.  Sincerely,   R. [place] Tool WorksCarter Forbes Loll,  MD

## 2018-07-24 DIAGNOSIS — J4540 Moderate persistent asthma, uncomplicated: Secondary | ICD-10-CM

## 2018-07-24 MED ORDER — FLOVENT HFA 110 MCG/ACT IN AERO: 2.0000 | INHALATION_SPRAY | Freq: Two times a day (BID) | RESPIRATORY_TRACT | 5 refills | Status: AC

## 2018-08-05 ENCOUNTER — Ambulatory Visit (INDEPENDENT_AMBULATORY_CARE_PROVIDER_SITE_OTHER): Payer: BC Managed Care – PPO | Admitting: Internal Medicine

## 2018-08-05 ENCOUNTER — Encounter: Payer: Self-pay | Admitting: Internal Medicine

## 2018-08-05 ENCOUNTER — Other Ambulatory Visit: Payer: Self-pay

## 2018-08-05 DIAGNOSIS — J3089 Other allergic rhinitis: Secondary | ICD-10-CM

## 2018-08-05 DIAGNOSIS — J4530 Mild persistent asthma, uncomplicated: Secondary | ICD-10-CM

## 2018-08-05 DIAGNOSIS — J0191 Acute recurrent sinusitis, unspecified: Secondary | ICD-10-CM | POA: Diagnosis not present

## 2018-08-05 MED ORDER — PREDNISONE 20 MG PO TABS: 20.0000 mg | ORAL_TABLET | Freq: Two times a day (BID) | ORAL | 0 refills | Status: AC

## 2018-08-05 MED ORDER — AMOXICILLIN-POT CLAVULANATE 875-125 MG PO TABS: 1.0000 | ORAL_TABLET | Freq: Two times a day (BID) | ORAL | 0 refills | Status: DC

## 2018-08-05 MED ORDER — FLUCONAZOLE 150 MG PO TABS: 150.0000 mg | ORAL_TABLET | Freq: Once | ORAL | 0 refills | Status: AC

## 2018-08-05 NOTE — Progress Notes (Signed)
Virtual Visit via Video Note  I connected with@ on 08/05/18 at 10:20 AM EDT by a video enabled telemedicine application and verified that I am speaking with the correct person using two identifiers. Location patient: home Location provider:work office Persons participating in the virtual visit: patient, provider  [place] national recommendations  regarding COVID 19 pandemic   video visit is advised over in office visit for this patient.  Patient aware  of the limitations of evaluation and management by telemedicine and  availability of in person appointments. and agreed to proceed.   HPI: [name] presents for video visit  Saturday clinic  SDA   She sees  Allergy asthma specialist  Seen June 23    Last antibiotic  wwas a while back  Under asthma care fairly stable and sinus pain a few weeks ago responding to otc sudafed and other but over past 2 days serious facial pain cheekbones  And paranasal paon  Took med and sudafed and naproxyn  with some help  Also had right neck pain   Lat nigh took flexeril  Has chronic  drip  And better since on singular  No fever  Vomiting  exposure to children  .  ROS: See pertinent positives and negatives per HPI. No fever cp sob vision changes  Awake pulled muscle right neck took flexeril  Past Medical History:  Diagnosis Date  . Anemia   . Asthma   . Bulging disc   . Hypertension   . Obesity   . PONV (postoperative nausea and vomiting)    PT IS [place] INDIAN    Past Surgical History:  Procedure Laterality Date  . CHOLECYSTECTOMY    . DILITATION & CURRETTAGE/HYSTROSCOPY WITH NOVASURE ABLATION N/A 09/20/2013   Procedure: DILATATION & CURETTAGE/HYSTEROSCOPY WITH NOVASURE ABLATION;  Surgeon: Juluis MireJohn S McComb, MD;  Location: [facility];  Service: Gynecology;  Laterality: N/A;  . GASTRIC BYPASS    . LAPAROSCOPIC TUBAL LIGATION Bilateral 09/20/2013   Procedure: LAPAROSCOPIC TUBAL LIGATION;  Surgeon: Juluis MireJohn S McComb, MD;  Location: [facility];  Service:  Gynecology;  Laterality: Bilateral;    Family History  Problem Relation Age of Onset  . Breast cancer Mother   . Hypertension Sister   . Asthma Brother   . Allergic rhinitis Neg Hx   . Eczema Neg Hx   . Immunodeficiency Neg Hx   . Urticaria Neg Hx   . Angioedema Neg Hx     Social History   Tobacco Use  . Smoking status: Never Smoker  . Smokeless tobacco: Never Used  Substance Use Topics  . Alcohol use: No  . Drug use: No      Current Outpatient Medications:  .  albuterol (PROAIR HFA) 108 (90 Base) MCG/ACT inhaler, Inhale 2 puffs into the lungs every 6 (six) hours as needed for wheezing or shortness of breath., Disp: 18 g, Rfl: 0 .  ALPRAZolam (XANAX) 0.25 MG tablet, take 1 tablet by mouth once daily if needed for anxiety, Disp: 30 tablet, Rfl: 2 .  amoxicillin-clavulanate (AUGMENTIN) 875-125 MG tablet, Take 1 tablet by mouth every 12 (twelve) hours. For sinusitis, Disp: 14 tablet, Rfl: 0 .  Azelastine HCl 0.15 % SOLN, Place 2 sprays into both nostrils 2 (two) times daily., Disp: 30 mL, Rfl: 5 .  cetirizine (ZYRTEC) 10 MG tablet, Take 10 mg by mouth daily., Disp: , Rfl:  .  clotrimazole-betamethasone (LOTRISONE) cream, Apply 1 application topically 2 (two) times daily., Disp: 100 g, Rfl: 11 .  cyanocobalamin (,  VITAMIN B-12,) 1000 MCG/ML injection, INJECT 1 ML INTO THE MUSCLE EVERY 30 DAYS, Disp: 1 mL, Rfl: 11 .  cyclobenzaprine (FLEXERIL) 10 MG tablet, Take 1 tablet (10 mg total) by mouth 3 (three) times daily as needed for muscle spasms., Disp: 30 tablet, Rfl: 0 .  Diclofenac Sodium (PENNSAID) 2 % SOLN, Place 2 application onto the skin 2 (two) times daily., Disp: 112 g, Rfl: 3 .  fluconazole (DIFLUCAN) 150 MG tablet, Take 1 tablet (150 mg total) by mouth once for 1 dose., Disp: 1 tablet, Rfl: 0 .  fluticasone (FLOVENT HFA) 110 MCG/ACT inhaler, Inhale 2 puffs into the lungs 2 (two) times daily., Disp: 1 Inhaler, Rfl: 5 .  furosemide (LASIX) 20 MG tablet, Take 1 tablet (20 mg  total) by mouth daily., Disp: 30 tablet, Rfl: 3 .  hyoscyamine (LEVSIN SL) 0.125 MG SL tablet, DISSOLVE ONE TABLET IN MOUTH EVERY 4 HOURS IF  NEEDED, Disp: 30 tablet, Rfl: 2 .  metoprolol tartrate (LOPRESSOR) 50 MG tablet, TAKE 1/2 (ONE-HALF) TABLET BY MOUTH TWICE DAILY, Disp: 30 tablet, Rfl: 3 .  montelukast (SINGULAIR) 10 MG tablet, Take 1 tablet (10 mg total) by mouth at bedtime., Disp: 30 tablet, Rfl: 5 .  omeprazole (PRILOSEC) 40 MG capsule, Take 1 capsule (40 mg total) by mouth daily., Disp: 30 capsule, Rfl: 3 .  predniSONE (DELTASONE) 20 MG tablet, Take 1 tablet (20 mg total) by mouth 2 (two) times daily with a meal for 3 days., Disp: 6 tablet, Rfl: 0 .  sodium chloride (OCEAN) 0.65 % SOLN nasal spray, Place 1 spray into both nostrils as needed for congestion., Disp: 480 mL, Rfl: 0 .  SUMAtriptan (IMITREX) 50 MG tablet, Take 1 tablet (50 mg total) by mouth every 2 (two) hours as needed for migraine. May repeat in 2 hours if headache persists or recurs., Disp: 10 tablet, Rfl: 0 .  Syringe/Needle, Disp, (SYRINGE 3CC/25GX1-1/2") 25G X 1-1/2" 3 ML MISC, Used to inject Vit B12, Disp: 15 each, Rfl: 1 .  traMADol (ULTRAM) 50 MG tablet, Take 1 tablet (50 mg total) by mouth at bedtime as needed., Disp: 30 tablet, Rfl: 3 .  Vitamin D, Ergocalciferol, (DRISDOL) 50000 units CAPS capsule, Take 1 capsule (50,000 Units total) by mouth every 7 (seven) days., Disp: 12 capsule, Rfl: 0  EXAM: BP Readings from Last 3 Encounters:  04/13/18 138/78  10/31/17 126/88  10/31/17 140/88    VITALS per patient if applicable: non toxic  Nl speech  GENERAL: alert, oriented, appears well and in no acute distress lookslike   Feels bad and tender maxillary sinus cheekbones but no swelling  And no  Cough or sneeze during visit  No toxic  HEENT: atraumatic, conjunttiva clear, no obvious abnormalities on inspection of external nose and ears NECK: normal movements of the head and neck LUNGS: on inspection no signs of  respiratory distress, breathing rate appears normal, no obvious gross SOB, gasping or wheezing CV: no obvious cyanosis MS: moves all visible extremities without noticeable abnormality  PSYCH/NEURO: pleasant and cooperative,looks uncomfortable    ASSESSMENT AND PLAN:  Discussed the following assessment and plan:    ICD-10-CM   1. Acute recurrent sinusitis, unspecified location  J01.91   2. Mild persistent asthma without complication  J45.30   3. Perennial and seasonal allergic rhinitis  J30.89    Suspect underlying allergies acute oncshronic sinus  Poss migraine triggered also   Because of severity of sx  Add antibiotic and poss 3 days pred  Fu with pcp or allergies if not impoving in next 72 hours   Continue sinus hygiene and meds   Add diflucan per pat request advised that otc monistat can work as well also if needed  Counseled.   Expectant management and discussion of plan and treatment with opportunity to ask questions and all were answered. The patient agreed with the plan and demonstrated an understanding of the instructions.   Advised to call back or seek an in-person evaluation if worsening  or having  further concerns . In the appointed time    Shanon Ace, MD

## 2018-08-07 ENCOUNTER — Telehealth: Payer: Self-pay | Admitting: Internal Medicine

## 2018-08-07 ENCOUNTER — Encounter: Payer: Self-pay | Admitting: Internal Medicine

## 2018-08-07 NOTE — Telephone Encounter (Signed)
Patient called [facility] on 7/11 at 8:41am stating that she thought she had a sinus infection.  Patient states she is having severe pain behind her eyes and nose and pain in neck.  Patient states she took some meds on an empty stomach and nausea began.   Looks like patient was seen by [facility].

## 2018-08-07 NOTE — Telephone Encounter (Signed)
Patient was seen at [facility] and was told by Dr. Regis Bill to follow up with pcp in 72 hours if not feeling anybetter

## 2018-08-12 ENCOUNTER — Encounter: Payer: Self-pay | Admitting: Family Medicine

## 2018-08-12 ENCOUNTER — Other Ambulatory Visit: Payer: Self-pay | Admitting: Internal Medicine

## 2018-08-12 ENCOUNTER — Other Ambulatory Visit: Payer: Self-pay

## 2018-08-12 ENCOUNTER — Ambulatory Visit (INDEPENDENT_AMBULATORY_CARE_PROVIDER_SITE_OTHER): Payer: BC Managed Care – PPO | Admitting: Family Medicine

## 2018-08-12 DIAGNOSIS — J4530 Mild persistent asthma, uncomplicated: Secondary | ICD-10-CM

## 2018-08-12 DIAGNOSIS — R0989 Other specified symptoms and signs involving the circulatory and respiratory systems: Secondary | ICD-10-CM | POA: Diagnosis not present

## 2018-08-12 DIAGNOSIS — J320 Chronic maxillary sinusitis: Secondary | ICD-10-CM

## 2018-08-12 MED ORDER — AZITHROMYCIN 250 MG PO TABS: ORAL_TABLET | ORAL | 0 refills | Status: DC

## 2018-08-12 NOTE — Progress Notes (Signed)
Phone [phone]  I acted as a Neurosurgeonscribe for Dr. Clover MealyStephen Hunter [name], LPN Subjective:  Virtual visit via Video note. Chief complaint: Chief Complaint  Patient presents with  . Sinus Problem  . Chest congestion    This visit type was conducted due to national recommendations for restrictions regarding the COVID-19 Pandemic (e.g. social distancing).  This format is felt to be most appropriate for this patient at this time balancing risks to patient and risks to population by having him in for in person visit.  No physical exam was performed (except for noted visual exam or audio findings with Telehealth visits).    Our team/I connected with [name] at 12:40 PM EDT by a video enabled telemedicine application (doxy.me or caregility through epic) and verified that I am speaking with the correct person using two identifiers.  Location patient: [place] Location provider: [facility], office Persons participating in the virtual visit:  patient  Our team/I discussed the limitations of evaluation and management by telemedicine and the availability of in person appointments. In light of current covid-19 pandemic, patient also understands that we are trying to protect them by minimizing in office contact if at all possible.  The patient expressed consent for telemedicine visit and agreed to proceed. Patient understands insurance will be billed.   ROS-  No fever,cough, body aches, nausea, vomiting, diarrhea.  No known contacts with covid 19 or someone being tested for covid 19. Chest and sinus congestion noted. Mild shortness of breath/chest tightness and fatigue noted. Some chills but that's her baseline. Mild sore throat- feels like from drainge. Does have headache. new loss of taste and  Smell but seems to happen with sinus infections.   Past Medical History-  Patient Active Problem List   Diagnosis Date Noted  . Right flank pain 05/12/2018  . Ear problem 04/13/2018  . New  daily persistent headache 11/30/2016  . Degenerative arthritis of right knee 07/05/2016  . Patellofemoral disorder, right 05/31/2016  . Excessive and redundant skin and subcutaneous tissue 03/30/2016  . Chronic sinusitis 01/05/2016  . Routine general medical examination at a health care facility 09/30/2015  . Adjustment disorder with mixed anxiety and depressed mood 07/04/2015  . B12 deficiency 09/25/2014  . Chondromalacia patellae of left knee 05/28/2014  . GERD (gastroesophageal reflux disease) 03/05/2014  . Morbid obesity (HCC) 03/05/2014  . Perennial and seasonal allergic rhinitis 03/05/2014  . Left knee pain 03/05/2014  . Essential hypertension 03/05/2014  . Mild persistent asthma 03/05/2014  . Hx of gastric bypass 03/05/2014    Medications- reviewed and updated Current Outpatient Medications  Medication Sig Dispense Refill  . albuterol (PROAIR HFA) 108 (90 Base) MCG/ACT inhaler Inhale 2 puffs into the lungs every 6 (six) hours as needed for wheezing or shortness of breath. 18 g 0  . ALPRAZolam (XANAX) 0.25 MG tablet take 1 tablet by mouth once daily if needed for anxiety 30 tablet 2  . amoxicillin-clavulanate (AUGMENTIN) 875-125 MG tablet Take 1 tablet by mouth every 12 (twelve) hours. For sinusitis 14 tablet 0  . Azelastine HCl 0.15 % SOLN Place 2 sprays into both nostrils 2 (two) times daily. 30 mL 5  . cetirizine (ZYRTEC) 10 MG tablet Take 10 mg by mouth daily.    . clotrimazole-betamethasone (LOTRISONE) cream Apply 1 application topically 2 (two) times daily. 100 g 11  . cyanocobalamin (,VITAMIN B-12,) 1000 MCG/ML injection INJECT 1 ML INTO THE MUSCLE EVERY 30 DAYS 1 mL 11  . fluticasone (FLOVENT HFA)  110 MCG/ACT inhaler Inhale 2 puffs into the lungs 2 (two) times daily. 1 Inhaler 5  . furosemide (LASIX) 20 MG tablet Take 1 tablet (20 mg total) by mouth daily. 30 tablet 3  . hyoscyamine (LEVSIN SL) 0.125 MG SL tablet DISSOLVE ONE TABLET IN MOUTH EVERY 4 HOURS IF  NEEDED 30  tablet 2  . metoprolol tartrate (LOPRESSOR) 50 MG tablet TAKE 1/2 (ONE-HALF) TABLET BY MOUTH TWICE DAILY 30 tablet 3  . montelukast (SINGULAIR) 10 MG tablet Take 1 tablet (10 mg total) by mouth at bedtime. 30 tablet 5  . omeprazole (PRILOSEC) 40 MG capsule Take 1 capsule (40 mg total) by mouth daily. 30 capsule 3  . sodium chloride (OCEAN) 0.65 % SOLN nasal spray Place 1 spray into both nostrils as needed for congestion. 480 mL 0  . SUMAtriptan (IMITREX) 50 MG tablet Take 1 tablet (50 mg total) by mouth every 2 (two) hours as needed for migraine. May repeat in 2 hours if headache persists or recurs. 10 tablet 0  . Syringe/Needle, Disp, (SYRINGE 3CC/25GX1-1/2") 25G X 1-1/2" 3 ML MISC Used to inject Vit B12 15 each 1  . traMADol (ULTRAM) 50 MG tablet Take 1 tablet (50 mg total) by mouth at bedtime as needed. 30 tablet 3  . azithromycin (ZITHROMAX) 250 MG tablet Take 2 tabs on day 1, then 1 tab daily until finished. Coverage for community acquired pneumonia- has already been on augmentin. 6 tablet 0   No current facility-administered medications for this visit.      Objective:  no self reported vitals Gen: NAD, resting comfortably Lungs: nonlabored, normal respiratory rate  Skin: appears dry, no obvious rash     Assessment and Plan   # SInus problem/now with chest congestion S: Pt was treated a week ago for sinus infection by Dr. Fabian SharpPanosh with Augmentin 875-125 mg and prednisone.  has one day left and still is not feeling well. Pt says it feels like it has moved to her chest. C/o Chest congestion and fatigue. Denies cough, fever or chills. Denies shortness of breath- but with movement feels like chest congestion worsens.  Had sinus headache yesterday. Pt concerned about COVID-19 people at work being tested (no known positives) with it but she has not been around them. Not wheezing much. Albuterol does help with chest congestion. Did not extend prednisone at this time. Offered doxycycline which may  have better sinus coverage but she states stomach does not tolerate this well so we went with azithromycin.   Clear discharge from nose. Pressure in bilateral maxillary sinuses. Mild improvement but comes and goes. She has lost smell and taste A/P:  48 year old female with asthma recently treated for sinusitis with prednisone and augmentin with only mild relief but now symptoms feeling like going down into chest. Will treat with azithromycin to cover for CAP while we await covid testing as below (then could have her in for x-ray). If she has worsening symptoms. She knows to seek care.     Patient with symptoms concerning for potential covid 19 Therefore: - testing ordered for covid 19 and information provided on drive up testing - she is going to try [employer] testing site before 2 today and if that doesn't work [facility] 4 in household- may take all to get tested today with community testing - recommended patient watch closely for shortness of breath or confusion or worsening symptoms and if those occur he should contact  us immediately  -recommended self isolation until negative test AND 3 days fever free AND improvement in respiratory symptoms AND if positive needs to be minimum of 14 days from symptom onset before ends her quarantine - work note written (see letter)  ______________________________________________________________ We have put in an order for you to be tested for COVID-19.  You do not need an appointment to go get testing -- please proceed to one of the following drive-up testing locations at your convenience. The line closes at 3:30pm daily. Test results may take up to a week to return.  [place] Location: [address], [place] (old [facility]) Hours: 8a-3:45p, M-F  Recommended follow up: prn for acute issue Future Appointments  Date Time Provider [place]  11/02/2018  9:40 AM Hoyt Koch, MD [facility]  please note chest congestion is new acute condition that required medication management  Lab/Order associations:   ICD-10-CM   1. Chest congestion  R09.89 Novel Coronavirus, NAA ([facility])  2. Mild persistent asthma without complication  J68.11   3. Chronic maxillary sinusitis  J32.0     Meds ordered this encounter  Medications  . azithromycin (ZITHROMAX) 250 MG tablet    Sig: Take 2 tabs on day 1, then 1 tab daily until finished. Coverage for community acquired pneumonia- has already been on augmentin.    Dispense:  6 tablet    Refill:  0    Return precautions advised.  Garret Reddish, MD

## 2018-08-12 NOTE — Patient Instructions (Signed)
Health Maintenance Due  Topic Date Due  . PAP SMEAR-Modifier  10/17/2017    Depression screen PHQ 2/9 10/31/2017 10/22/2016  Decreased Interest 0 0  Down, Depressed, Hopeless 0 1  PHQ - 2 Score 0 1

## 2018-08-14 ENCOUNTER — Other Ambulatory Visit: Payer: Self-pay

## 2018-08-14 DIAGNOSIS — Z20828 Contact with and (suspected) exposure to other viral communicable diseases: Secondary | ICD-10-CM

## 2018-08-14 DIAGNOSIS — Z20822 Contact with and (suspected) exposure to covid-19: Secondary | ICD-10-CM

## 2018-08-16 ENCOUNTER — Encounter: Payer: Self-pay | Admitting: Internal Medicine

## 2018-08-16 ENCOUNTER — Telehealth: Payer: Self-pay

## 2018-08-16 LAB — NOVEL CORONAVIRUS, NAA: SARS-CoV-2, NAA: DETECTED — AB

## 2018-08-16 NOTE — Telephone Encounter (Signed)
[place] for note for 14 days from the date of onset symptoms

## 2018-08-16 NOTE — Telephone Encounter (Signed)
Recd call from [facility], patient has tested ;positive for COVID-19----patient advised of result and to quarantine for at least 14 days---please advise if patient needs to be told any other information--patient has no taste/smell, nasal congestion and is having to use albuterol inhaler about every 4 hours----some tightness in chest, but not so bad she can't breathe---inhaler is providing relief ---no cough----no fever, no n/v/d----but patient will need work note created in [employer] so that she can print that note and fax to her employer---note needs to say that she should remain out of work for next 14 days----routing to dr Jenny Reichmann, please advise in the absence of dr crawford, I will call patient back with any further information

## 2018-08-17 NOTE — Telephone Encounter (Addendum)
Letter created and sent to patient per patient request--patient advised she should be able to print from mychart, any problems with letter, can call back and ask for tamara,RN

## 2018-08-18 ENCOUNTER — Encounter: Payer: Self-pay | Admitting: Internal Medicine

## 2018-08-18 NOTE — Telephone Encounter (Signed)
Md is put of the office pls advise on email.Marland KitchenJohny Chess

## 2018-08-20 ENCOUNTER — Encounter: Payer: Self-pay | Admitting: Internal Medicine

## 2018-08-22 ENCOUNTER — Encounter: Payer: Self-pay | Admitting: Internal Medicine

## 2018-08-25 ENCOUNTER — Encounter: Payer: Self-pay | Admitting: Internal Medicine

## 2018-10-02 ENCOUNTER — Encounter: Payer: Self-pay | Admitting: Internal Medicine

## 2018-10-22 ENCOUNTER — Other Ambulatory Visit: Payer: Self-pay | Admitting: Internal Medicine

## 2018-11-02 ENCOUNTER — Encounter: Payer: Self-pay | Admitting: Internal Medicine

## 2018-11-02 ENCOUNTER — Other Ambulatory Visit: Payer: Self-pay

## 2018-11-02 ENCOUNTER — Ambulatory Visit (INDEPENDENT_AMBULATORY_CARE_PROVIDER_SITE_OTHER): Payer: BC Managed Care – PPO | Admitting: Internal Medicine

## 2018-11-02 ENCOUNTER — Other Ambulatory Visit (INDEPENDENT_AMBULATORY_CARE_PROVIDER_SITE_OTHER): Payer: BC Managed Care – PPO

## 2018-11-02 VITALS — BP 122/78 | HR 48 | Temp 98.2°F | Ht 64.0 in | Wt 308.0 lb

## 2018-11-02 DIAGNOSIS — Z23 Encounter for immunization: Secondary | ICD-10-CM

## 2018-11-02 DIAGNOSIS — J4530 Mild persistent asthma, uncomplicated: Secondary | ICD-10-CM

## 2018-11-02 DIAGNOSIS — Z0000 Encounter for general adult medical examination without abnormal findings: Secondary | ICD-10-CM

## 2018-11-02 DIAGNOSIS — I10 Essential (primary) hypertension: Secondary | ICD-10-CM

## 2018-11-02 DIAGNOSIS — R002 Palpitations: Secondary | ICD-10-CM

## 2018-11-02 DIAGNOSIS — F4323 Adjustment disorder with mixed anxiety and depressed mood: Secondary | ICD-10-CM

## 2018-11-02 DIAGNOSIS — Z9884 Bariatric surgery status: Secondary | ICD-10-CM

## 2018-11-02 DIAGNOSIS — E538 Deficiency of other specified B group vitamins: Secondary | ICD-10-CM

## 2018-11-02 LAB — COMPREHENSIVE METABOLIC PANEL
ALT: 11 U/L (ref 0–35)
AST: 17 U/L (ref 0–37)
Albumin: 3.5 g/dL (ref 3.5–5.2)
Alkaline Phosphatase: 74 U/L (ref 39–117)
BUN: 10 mg/dL (ref 6–23)
CO2: 28 mEq/L (ref 19–32)
Calcium: 8.7 mg/dL (ref 8.4–10.5)
Chloride: 107 mEq/L (ref 96–112)
Creatinine, Ser: 0.51 mg/dL (ref 0.40–1.20)
GFR: 129.15 mL/min (ref >60.00)
Glucose, Bld: 85 mg/dL (ref 70–99)
Potassium: 4.1 mEq/L (ref 3.5–5.1)
Sodium: 140 mEq/L (ref 135–145)
Total Bilirubin: 0.5 mg/dL (ref 0.2–1.2)
Total Protein: 6 g/dL (ref 6.0–8.3)

## 2018-11-02 LAB — CBC
HCT: 32.4 % — ABNORMAL LOW (ref 36.0–46.0)
Hemoglobin: 9.7 g/dL — ABNORMAL LOW (ref 12.0–15.0)
MCHC: 30 g/dL (ref 30.0–36.0)
MCV: 74.3 fl — ABNORMAL LOW (ref 78.0–100.0)
Platelets: 280 10*3/uL (ref 150.0–400.0)
RBC: 4.36 Mil/uL (ref 3.87–5.11)
RDW: 17.8 % — ABNORMAL HIGH (ref 11.5–15.5)
WBC: 5.8 10*3/uL (ref 4.0–10.5)

## 2018-11-02 LAB — LIPID PANEL
Cholesterol: 130 mg/dL (ref 0–200)
HDL: 58.9 mg/dL (ref >39.00)
LDL Cholesterol: 63 mg/dL (ref 0–99)
NonHDL: 70.97
Total CHOL/HDL Ratio: 2
Triglycerides: 40 mg/dL (ref 0.0–149.0)
VLDL: 8 mg/dL (ref 0.0–40.0)

## 2018-11-02 LAB — HEMOGLOBIN A1C: Hgb A1c MFr Bld: 5.6 % (ref 4.6–6.5)

## 2018-11-02 LAB — TSH: TSH: 1.67 u[IU]/mL (ref 0.35–4.50)

## 2018-11-02 LAB — VITAMIN D 25 HYDROXY (VIT D DEFICIENCY, FRACTURES): VITD: 18.09 ng/mL — ABNORMAL LOW (ref 30.00–100.00)

## 2018-11-02 LAB — T4, FREE: Free T4: 0.9 ng/dL (ref 0.60–1.60)

## 2018-11-02 NOTE — Assessment & Plan Note (Signed)
Stable and did well with covid-19 infection.

## 2018-11-02 NOTE — Progress Notes (Signed)
   Subjective:   Patient ID: [name], female    DOB: 05/27/1970, 48 y.o.   MRN: [id number]  HPI The patient is a 48 YO female coming in for physical.  PMH, [place], social history reviewed and updated  Review of Systems  Constitutional: Negative.   HENT: Negative.   Eyes: Negative.   Respiratory: Negative for cough, chest tightness and shortness of breath.   Cardiovascular: Negative for chest pain, palpitations and leg swelling.  Gastrointestinal: Negative for abdominal distention, abdominal pain, constipation, diarrhea, nausea and vomiting.  Musculoskeletal: Negative.   Skin: Negative.   Neurological: Negative.   Psychiatric/Behavioral: Negative.     Objective:  Physical Exam Constitutional:      Appearance: She is well-developed. She is obese.  HENT:     Head: Normocephalic and atraumatic.  Neck:     Musculoskeletal: Normal range of motion.  Cardiovascular:     Rate and Rhythm: Normal rate and regular rhythm.  Pulmonary:     Effort: Pulmonary effort is normal. No respiratory distress.     Breath sounds: Normal breath sounds. No wheezing or rales.  Abdominal:     General: Bowel sounds are normal. There is no distension.     Palpations: Abdomen is soft.     Tenderness: There is no abdominal tenderness. There is no rebound.  Skin:    General: Skin is warm and dry.  Neurological:     Mental Status: She is alert and oriented to person, place, and time.     Coordination: Coordination normal.     Vitals:   11/02/18 0931  BP: 122/78  Pulse: (!) 48  Temp: 98.2 F (36.8 C)  TempSrc: Oral  SpO2: 98%  Weight: (!) 308 lb (139.7 kg)  Height: 5\' 4"  (1.626 m)   EKG: Rate 46, axis normal, interval normal, sinus, no st or t wave changes, no significant change when compared to 2018  Assessment & Plan:  Flu shot given at visit

## 2018-11-02 NOTE — Assessment & Plan Note (Signed)
Stable, rare xanax.

## 2018-11-02 NOTE — Assessment & Plan Note (Signed)
Takes vitamin daily.

## 2018-11-02 NOTE — Assessment & Plan Note (Signed)
EKG done and will get holter for palpitations. BP at goal so checking CMP and adjust meds as needed.

## 2018-11-02 NOTE — Assessment & Plan Note (Signed)
Needs labs yearly and takes multivitamin.

## 2018-11-02 NOTE — Patient Instructions (Addendum)
We will get the heart monitor done and have done the EKG today which is the same.   Health Maintenance, Female Adopting a healthy lifestyle and getting preventive care are important in promoting health and wellness. Ask your health care provider about:  The right schedule for you to have regular tests and exams.  Things you can do on your own to prevent diseases and keep yourself healthy. What should I know about diet, weight, and exercise? Eat a healthy diet   Eat a diet that includes plenty of vegetables, fruits, low-fat dairy products, and lean protein.  Do not eat a lot of foods that are high in solid fats, added sugars, or sodium. Maintain a healthy weight Body mass index (BMI) is used to identify weight problems. It estimates body fat based on height and weight. Your health care provider can help determine your BMI and help you achieve or maintain a healthy weight. Get regular exercise Get regular exercise. This is one of the most important things you can do for your health. Most adults should:  Exercise for at least 150 minutes each week. The exercise should increase your heart rate and make you sweat (moderate-intensity exercise).  Do strengthening exercises at least twice a week. This is in addition to the moderate-intensity exercise.  Spend less time sitting. Even light physical activity can be beneficial. Watch cholesterol and blood lipids Have your blood tested for lipids and cholesterol at 47 years of age, then have this test every 5 years. Have your cholesterol levels checked more often if:  Your lipid or cholesterol levels are high.  You are older than 47 years of age.  You are at high risk for heart disease. What should I know about cancer screening? Depending on your health history and family history, you may need to have cancer screening at various ages. This may include screening for:  Breast cancer.  Cervical cancer.  Colorectal cancer.  Skin cancer.   Lung cancer. What should I know about heart disease, diabetes, and high blood pressure? Blood pressure and heart disease  High blood pressure causes heart disease and increases the risk of stroke. This is more likely to develop in people who have high blood pressure readings, are of African descent, or are overweight.  Have your blood pressure checked: ? Every 3-5 years if you are 41-7 years of age. ? Every year if you are 96 years old or older. Diabetes Have regular diabetes screenings. This checks your fasting blood sugar level. Have the screening done:  Once every three years after age 28 if you are at a normal weight and have a low risk for diabetes.  More often and at a younger age if you are overweight or have a high risk for diabetes. What should I know about preventing infection? Hepatitis B If you have a higher risk for hepatitis B, you should be screened for this virus. Talk with your health care provider to find out if you are at risk for hepatitis B infection. Hepatitis C Testing is recommended for:  Everyone born from [birth year] through 1964.  Anyone with known risk factors for hepatitis C. Sexually transmitted infections (STIs)  Get screened for STIs, including gonorrhea and chlamydia, if: ? You are sexually active and are younger than 47 years of age. ? You are older than 47 years of age and your health care provider tells you that you are at risk for this type of infection. ? Your sexual activity has changed since you  were last screened, and you are at increased risk for chlamydia or gonorrhea. Ask your health care provider if you are at risk.  Ask your health care provider about whether you are at high risk for HIV. Your health care provider may recommend a prescription medicine to help prevent HIV infection. If you choose to take medicine to prevent HIV, you should first get tested for HIV. You should then be tested every 3 months for as long as you are taking the  medicine. Pregnancy  If you are about to stop having your period (premenopausal) and you may become pregnant, seek counseling before you get pregnant.  Take 400 to 800 micrograms (mcg) of folic acid every day if you become pregnant.  Ask for birth control (contraception) if you want to prevent pregnancy. Osteoporosis and menopause Osteoporosis is a disease in which the bones lose minerals and strength with aging. This can result in bone fractures. If you are 32 years old or older, or if you are at risk for osteoporosis and fractures, ask your health care provider if you should:  Be screened for bone loss.  Take a calcium or vitamin D supplement to lower your risk of fractures.  Be given hormone replacement therapy (HRT) to treat symptoms of menopause. Follow these instructions at home: Lifestyle  Do not use any products that contain nicotine or tobacco, such as cigarettes, e-cigarettes, and chewing tobacco. If you need help quitting, ask your health care provider.  Do not use street drugs.  Do not share needles.  Ask your health care provider for help if you need support or information about quitting drugs. Alcohol use  Do not drink alcohol if: ? Your health care provider tells you not to drink. ? You are pregnant, may be pregnant, or are planning to become pregnant.  If you drink alcohol: ? Limit how much you use to 0-1 drink a day. ? Limit intake if you are breastfeeding.  Be aware of how much alcohol is in your drink. In the [place] one drink equals one 12 oz bottle of beer (355 mL), one 5 oz glass of wine (148 mL), or one 1 oz glass of hard liquor (44 mL). General instructions  Schedule regular health, dental, and eye exams.  Stay current with your vaccines.  Tell your health care provider if: ? You often feel depressed. ? You have ever been abused or do not feel safe at home. Summary  Adopting a healthy lifestyle and getting preventive care are important in  promoting health and wellness.  Follow your health care provider's instructions about healthy diet, exercising, and getting tested or screened for diseases.  Follow your health care provider's instructions on monitoring your cholesterol and blood pressure. This information is not intended to replace advice given to you by your health care provider. Make sure you discuss any questions you have with your health care provider. Document Released: 07/27/2010 Document Revised: 01/04/2018 Document Reviewed: 01/04/2018 Elsevier Patient Education  2020 [employer].

## 2018-11-02 NOTE — Assessment & Plan Note (Signed)
Flu shot given. Tetanus up to date. Pap smear with gyn. Counseled about sun safety and mole surveillance. Counseled about the dangers of distracted driving. Given 10 year screening recommendations.

## 2018-11-02 NOTE — Assessment & Plan Note (Signed)
Weight is not decreasing due to pandemic and recent covid-19 infection.

## 2018-11-06 ENCOUNTER — Telehealth: Payer: Self-pay

## 2018-11-06 ENCOUNTER — Other Ambulatory Visit: Payer: Self-pay | Admitting: Internal Medicine

## 2018-11-06 MED ORDER — VITAMIN D (ERGOCALCIFEROL) 1.25 MG (50000 UNIT) PO CAPS: 50000.0000 [IU] | ORAL_CAPSULE | ORAL | 0 refills | Status: DC

## 2018-11-06 NOTE — Telephone Encounter (Signed)
Copied from [place] [phone]. Topic: Quick Communication - Lab Results (Clinic Use ONLY) >> Nov 06, 2018 11:29 AM Cresenciano Lick, CMA wrote: Left message for pt to call back Re: recent labs.  [facility] may inform patient of results. >> Nov 06, 2018  3:35 PM Leward Quan A wrote: Patient called back stated that she saw the results in My Chart and did not need an explanation and will collect meds. At pharmacy

## 2018-11-06 NOTE — Telephone Encounter (Signed)
Noted  

## 2018-11-14 ENCOUNTER — Other Ambulatory Visit: Payer: Self-pay | Admitting: Internal Medicine

## 2018-11-14 DIAGNOSIS — R002 Palpitations: Secondary | ICD-10-CM

## 2018-11-17 ENCOUNTER — Other Ambulatory Visit: Payer: Self-pay | Admitting: Obstetrics and Gynecology

## 2018-11-17 DIAGNOSIS — Z9189 Other specified personal risk factors, not elsewhere classified: Secondary | ICD-10-CM

## 2018-11-21 ENCOUNTER — Encounter: Payer: Self-pay | Admitting: Internal Medicine

## 2018-11-21 MED ORDER — ALPRAZOLAM 0.25 MG PO TABS: ORAL_TABLET | ORAL | 0 refills | Status: DC

## 2018-11-26 ENCOUNTER — Other Ambulatory Visit: Payer: Self-pay | Admitting: Internal Medicine

## 2018-11-29 ENCOUNTER — Telehealth: Payer: Self-pay | Admitting: Internal Medicine

## 2018-11-29 ENCOUNTER — Telehealth: Payer: Self-pay

## 2018-11-29 NOTE — Telephone Encounter (Signed)
LM with monitor instructions. Ordered 14 day ZIO AT.

## 2018-11-29 NOTE — Telephone Encounter (Signed)
April, from [facility], called stating the zio live telemetry heart monitor is not covered by patients insurance. April states she is going to switch it to a cardiac event monitor for two weeks. Please advise.    [phone]

## 2018-11-29 NOTE — Telephone Encounter (Signed)
Pt's Insurance does not cover ZIO AT.Marland KitchenMarland KitchenI will order a 14 day [employer] Event monitor.   I called ordering MD's office and made them aware of the change.

## 2018-11-30 NOTE — Telephone Encounter (Signed)
Fine to do 

## 2018-11-30 NOTE — Telephone Encounter (Signed)
fyi

## 2018-11-30 NOTE — Telephone Encounter (Signed)
LVM with MD response  

## 2018-11-30 NOTE — Telephone Encounter (Signed)
Follow Up:    [name] called from Dr Nathanial Millman Office. She said Dr Sharlet Salina said it was alright to to do The Cardiac Event Monitor.

## 2018-12-07 ENCOUNTER — Encounter: Payer: Self-pay | Admitting: Cardiovascular Disease

## 2018-12-07 ENCOUNTER — Ambulatory Visit (INDEPENDENT_AMBULATORY_CARE_PROVIDER_SITE_OTHER): Payer: BC Managed Care – PPO

## 2018-12-07 DIAGNOSIS — R002 Palpitations: Secondary | ICD-10-CM | POA: Diagnosis not present

## 2018-12-18 ENCOUNTER — Telehealth: Payer: Self-pay | Admitting: Nurse Practitioner

## 2018-12-18 NOTE — Telephone Encounter (Signed)
Received monitor report from [employer] with report of sinus tachycardia at rate of 145-156 bpm that occurred on 12/16/18 at 0304 EST.  Left message for patient to call back to determine activity and/or symptoms at the time. Also routing message to Dr. Sharlet Salina who ordered the monitor.

## 2018-12-19 NOTE — Telephone Encounter (Signed)
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  She did feel palpitations and a sensation like her body was "draining" and then it resolved.  Denies lightheadedness, dizziness or CP.  Advised I would show to DOD and call back if any recommendations, otherwise continue to wear monitor.  Pt verbalized understanding. Spoke with Dr. Curt Bears, DOD and he said continue to monitor.

## 2018-12-31 ENCOUNTER — Other Ambulatory Visit: Payer: Self-pay | Admitting: Internal Medicine

## 2019-01-01 NOTE — Telephone Encounter (Signed)
[place] Controlled Database Checked Last filled: 11/21/18 # 30 LOV w/you: 11/02/18 Next appt w/you: None

## 2019-01-10 ENCOUNTER — Encounter: Payer: Self-pay | Admitting: Internal Medicine

## 2019-01-26 ENCOUNTER — Other Ambulatory Visit: Payer: Self-pay | Admitting: Allergy and Immunology

## 2019-01-26 ENCOUNTER — Other Ambulatory Visit: Payer: Self-pay | Admitting: Internal Medicine

## 2019-02-14 ENCOUNTER — Other Ambulatory Visit: Payer: Self-pay | Admitting: Internal Medicine

## 2019-02-14 ENCOUNTER — Other Ambulatory Visit: Payer: Self-pay | Admitting: Allergy and Immunology

## 2019-03-11 ENCOUNTER — Other Ambulatory Visit: Payer: Self-pay | Admitting: Internal Medicine

## 2019-03-22 ENCOUNTER — Other Ambulatory Visit: Payer: Self-pay | Admitting: Allergy and Immunology

## 2019-04-02 ENCOUNTER — Ambulatory Visit (INDEPENDENT_AMBULATORY_CARE_PROVIDER_SITE_OTHER): Payer: BC Managed Care – PPO

## 2019-04-02 ENCOUNTER — Other Ambulatory Visit: Payer: Self-pay

## 2019-04-02 ENCOUNTER — Ambulatory Visit: Payer: BC Managed Care – PPO | Admitting: Family Medicine

## 2019-04-02 ENCOUNTER — Encounter: Payer: Self-pay | Admitting: Family Medicine

## 2019-04-02 VITALS — BP 122/80 | HR 74 | Ht 64.0 in | Wt 308.0 lb

## 2019-04-02 DIAGNOSIS — M79672 Pain in left foot: Secondary | ICD-10-CM

## 2019-04-02 DIAGNOSIS — M79671 Pain in right foot: Secondary | ICD-10-CM

## 2019-04-02 NOTE — Progress Notes (Signed)
I, [name], LAT, ATC, am serving as scribe for Dr. Clementeen Graham.  [name] is a 49 y.o. female who presents to [facility] at [facility] today for bilateral foot pain.  She was last seen by Dr. Katrinka Blazing on 08/24/16 for R knee pain.  Her foot pain began L arch foot 2 months ago R top foot 2 weeks ago. She rates her pain at a 4/10 but during the episodes 10-12 and describes her pain as aching and tight. Patient works at the court house and in order for her to be able to wear tennis shoes at work she needs a note, would like note to say patient can wear tennis shoes if needed for her foot pain.   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  Health history is significant for morbid obesity hypertension history of gastric bypass and B12 deficiency.   Exam:  BP 122/80 (BP Location: Left Arm, Patient Position: Sitting, Cuff Size: Large)   Pulse 74   Ht 5\' 4"  (1.626 m)   Wt (!) 308 lb (139.7 kg)   SpO2 99%   BMI 52.87 kg/m  General: Well Developed, well nourished, and in no acute distress.   MSK:  Right foot under normal-appearing no significant deformity. Slight loss of transverse and longitudinal arch. Mildly tender palpation dorsal midfoot.  Mildly tender palpation plantar foot.  Left foot: Normal-appearing slight loss of transverse and longitudinal arch.  Mild tender palpation plantar mid arch and midfoot.  Normal foot and ankle motion bilaterally.  Slight pronation with standing bilateral feet.  Mild antalgic gait bilaterally.    Lab and Radiology Results  X-ray images  feet bilaterally obtained today personally and independently reviewed.  Right foot: Mild degenerative changes present in midfoot. Tiny accessory ossicle versus old avulsion at medial aspect of medial cuneiform. No acute fractures.  Left foot: Moderate degenerative changes of the tarsometatarsal joints.  No acute fractures.  Await formal radiology review    Assessment and Plan: 49 y.o. female with bilateral foot pain left longer than right.  Patient has some pronation with standing which likely exacerbates some loss of her longitudinal arch and foot DJD seen on x-ray.  Plan for scaphoid pads eccentric exercises and well fitted supportive footwear. Additionally will use Voltaren gel.  Work note written to allow patient to wear comfortable tennis shoes at work. Recheck back in about a month.  Return sooner if needed.   PDMP not reviewed this encounter. Orders Placed This Encounter  Procedures  . DG Foot Complete Left    Standing Status:   Future    Number of Occurrences:   1    Standing Expiration Date:   06/01/2020    Order Specific Question:   Reason for Exam (SYMPTOM  OR DIAGNOSIS REQUIRED)    Answer:   foot pain x 10 week    Order Specific Question:   Is patient pregnant?    Answer:   No    Order Specific Question:   Preferred imaging location?    Answer:   08/01/2020    Order Specific  Question:   Radiology Contrast Protocol - do NOT remove file path    Answer:   \\[email or web address]\Radiant\DXFluoroContrastProtocols.pdf  . DG Foot Complete Right    Standing Status:   Future    Number of Occurrences:   1    Standing Expiration Date:   06/01/2020    Order Specific Question:   Reason for Exam (SYMPTOM  OR DIAGNOSIS REQUIRED)    Answer:   foot pain x 2 weeks    Order Specific Question:   Is patient pregnant?    Answer:   No    Order Specific Question:   Preferred imaging location?    Answer:   [facility]    Order Specific Question:   Radiology Contrast  Protocol - do NOT remove file path    Answer:   \\[email or web address]\Radiant\DXFluoroContrastProtocols.pdf   No orders of the defined types were placed in this encounter.    Discussed warning signs or symptoms. Please see discharge instructions. Patient expresses understanding.   The above documentation has been reviewed and is accurate and complete Lynne Leader

## 2019-04-02 NOTE — Patient Instructions (Addendum)
Thank you for coming in today. Use scaphoid pads in your shoes.  Get good supportive shoes at work.  Scaphoid pads size small from [email or web address]  Use voltaren gel on your feet up to 4x daily for pain.   Do some foot exercise.  Rest your feet on a towel or soft carpet and pull the towel or curl your toes in.  Do some pushing against a band.  30 reps 2x daily.   Get xray today.  Recheck in 1 month.   Please perform the exercise program that we have prepared for you and gone over in detail on a daily basis.  In addition to the handout you were provided you can access your program through: [email or web address]   Your unique program code is:  [email or web address]

## 2019-04-03 NOTE — Progress Notes (Signed)
X-ray left foot also shows some arthritis.  Small bone spurs are seen as well.  Radiology because the bone spurs enthesophytes

## 2019-04-03 NOTE — Progress Notes (Signed)
Shows mild arthritis.

## 2019-04-10 ENCOUNTER — Other Ambulatory Visit: Payer: Self-pay

## 2019-04-10 ENCOUNTER — Encounter: Payer: Self-pay | Admitting: Allergy and Immunology

## 2019-04-10 ENCOUNTER — Ambulatory Visit (INDEPENDENT_AMBULATORY_CARE_PROVIDER_SITE_OTHER): Payer: BC Managed Care – PPO | Admitting: Allergy and Immunology

## 2019-04-10 DIAGNOSIS — J4530 Mild persistent asthma, uncomplicated: Secondary | ICD-10-CM | POA: Diagnosis not present

## 2019-04-10 DIAGNOSIS — H6983 Other specified disorders of Eustachian tube, bilateral: Secondary | ICD-10-CM

## 2019-04-10 DIAGNOSIS — K219 Gastro-esophageal reflux disease without esophagitis: Secondary | ICD-10-CM | POA: Diagnosis not present

## 2019-04-10 DIAGNOSIS — H6980 Other specified disorders of Eustachian tube, unspecified ear: Secondary | ICD-10-CM | POA: Insufficient documentation

## 2019-04-10 DIAGNOSIS — J3089 Other allergic rhinitis: Secondary | ICD-10-CM

## 2019-04-10 DIAGNOSIS — H6990 Unspecified Eustachian tube disorder, unspecified ear: Secondary | ICD-10-CM | POA: Insufficient documentation

## 2019-04-10 MED ORDER — FAMOTIDINE 20 MG PO TABS: 20.0000 mg | ORAL_TABLET | Freq: Two times a day (BID) | ORAL | 5 refills | Status: DC

## 2019-04-10 MED ORDER — MONTELUKAST SODIUM 10 MG PO TABS: 10.0000 mg | ORAL_TABLET | Freq: Every day | ORAL | 5 refills | Status: DC

## 2019-04-10 MED ORDER — ALBUTEROL SULFATE HFA 108 (90 BASE) MCG/ACT IN AERS: 2.0000 | INHALATION_SPRAY | Freq: Four times a day (QID) | RESPIRATORY_TRACT | 1 refills | Status: AC | PRN

## 2019-04-10 MED ORDER — AZELASTINE-FLUTICASONE 137-50 MCG/ACT NA SUSP: 1.0000 | Freq: Two times a day (BID) | NASAL | 5 refills | Status: AC | PRN

## 2019-04-10 NOTE — Assessment & Plan Note (Addendum)
Currently with suboptimal control.  Continue appropriate allergen avoidance measures.  A prescription has been provided for azelastine/fluticasone nasal spray, 1 spray per nostril twice daily as needed.   Nasal saline lavage (NeilMed) has been recommended as needed and prior to medicated nasal sprays along with instructions for proper administration.  For thick post nasal drainage, nasal congestion, and/or sinus pressure, add guaifenesin 1200 mg (Mucinex Maximum Strength) plus/minus pseudoephedrine 120 mg  twice daily as needed with adequate hydration as discussed. Pseudoephedrine is only to be used for short-term relief of nasal/sinus congestion. Long-term use is discouraged due to potential side effects.  If allergen avoidance measures and medications fail to adequately relieve symptoms, aeroallergen immunotherapy will be considered.

## 2019-04-10 NOTE — Patient Instructions (Signed)
Mild persistent asthma Stable.    Continue montelukast 10 mg daily at bedtime and albuterol HFA, 1 to 2 inhalations every 4-6 hours if needed.  A refill prescription has been provided for montelukast.  During respiratory tract infections or asthma flares, add Flovent 110g 3 inhalations via spacer device 3 times per day until symptoms have returned to baseline.  Subjective and objective measures of pulmonary function will be followed and the treatment plan will be adjusted accordingly.  Perennial and seasonal allergic rhinitis Currently with suboptimal control.  Continue appropriate allergen avoidance measures.  A prescription has been provided for azelastine/fluticasone nasal spray, 1 spray per nostril twice daily as needed.   Nasal saline lavage (NeilMed) has been recommended as needed and prior to medicated nasal sprays along with instructions for proper administration.  For thick post nasal drainage, nasal congestion, and/or sinus pressure, add guaifenesin 1200 mg (Mucinex Maximum Strength) plus/minus pseudoephedrine 120 mg  twice daily as needed with adequate hydration as discussed. Pseudoephedrine is only to be used for short-term relief of nasal/sinus congestion. Long-term use is discouraged due to potential side effects.  If allergen avoidance measures and medications fail to adequately relieve symptoms, aeroallergen immunotherapy will be considered.  Eustachian tube dysfunction  Treatment plan as outlined above for allergic rhinitis.  If this problem persists or progresses despite treatment plan as outlined above, further evaluation by otolaryngology, Dr. Suszanne Conners, may be warranted.  GERD (gastroesophageal reflux disease) The patient is interested in reducing/discontinuing proton pump inhibitor use due to potential side effects.  A prescription has been provided for famotidine (Pepcid) 20 mg twice daily.  If acid reflux is not well controlled with Pepcid, resume using  omeprazole.  Continue appropriate reflux lifestyle modifications.   Return in about 4 months (around 08/10/2019), or if symptoms worsen or fail to improve.

## 2019-04-10 NOTE — Assessment & Plan Note (Signed)
   Treatment plan as outlined above for allergic rhinitis.  If this problem persists or progresses despite treatment plan as outlined above, further evaluation by otolaryngology, Dr. Teoh, may be warranted. 

## 2019-04-10 NOTE — Assessment & Plan Note (Signed)
The patient is interested in reducing/discontinuing proton pump inhibitor use due to potential side effects.  A prescription has been provided for famotidine (Pepcid) 20 mg twice daily.  If acid reflux is not well controlled with Pepcid, resume using omeprazole.  Continue appropriate reflux lifestyle modifications.

## 2019-04-10 NOTE — Progress Notes (Signed)
Follow-up Telemedicine Note  RE: [name] MRN: [id number] DOB: [birth year] Date of Telemedicine Visit: 04/10/2019  Primary care provider: Hoyt Koch, MD Referring provider: Hoyt Koch, *  Telemedicine Follow Up Visit via Telephone: I connected with [name] for a follow up on 04/10/19 by telephone and verified that I am speaking with the correct person using two identifiers.   The limitations, risks, security and privacy concerns of performing an evaluation and management service by telemedicine, the availability of in person appointments, and that there may be a patient responsible charge related to this service were discussed. The patient expressed understanding and agreed to proceed.  Patient is at home.  Provider is at the office.  Visit start time: 4:40 PM Visit end time: 5:18 PM Insurance consent/check in by: Anderson Malta Medical consent and medical assistant/nurse: Caryl Pina  History of present illness: [name] is a 49 y.o. female with persistent asthma, allergic rhinitis, and gastroesophageal reflux presented today via telemedicine for follow-up.  She was last seen in this clinic in June 2020.  She reports that asthma has been well controlled with montelukast 10 mg daily until she was diagnosed with COVID-19 in July 2020.  During that time, she started using Flovent 2 inhalations via spacer device twice daily with benefit.  In addition, she was prescribed prednisone by her primary care physician.  She is currently taking montelukast 10 mg daily and uses fluticasone nasal spray sporadically every few days.  She notes that if she misses a dose or 2 of montelukast her "allergy and asthma gives (her) a fit."  She reports that her nasal symptoms have been moderately well controlled with azelastine nasal spray and montelukast.  However, she has been experiencing ear fullness/pressure.  In fact, she went [facility] occasion because of the  ear pressure.  She currently takes omeprazole every other day.  She has tried to go off of the proton pump inhibitor completely because she is concerned about side effects, however increased acid reflux prevents her from staying off of it completely.  Assessment and plan: Mild persistent asthma Stable.    Continue montelukast 10 mg daily at bedtime and albuterol HFA, 1 to 2 inhalations every 4-6 hours if needed.  A refill prescription has been provided for montelukast.  During respiratory tract infections or asthma flares, add Flovent 110g 3 inhalations via spacer device 3 times per day until symptoms have returned to baseline.  Subjective and objective measures of pulmonary function will be followed and the treatment plan will be adjusted accordingly.  Perennial and seasonal allergic rhinitis Currently with suboptimal control.  Continue appropriate allergen avoidance measures.  A prescription has been provided for azelastine/fluticasone nasal spray, 1 spray per nostril twice daily as needed.   Nasal saline lavage (NeilMed) has been recommended as needed and prior to medicated nasal sprays along with instructions for proper administration.  For thick post nasal drainage, nasal congestion, and/or sinus pressure, add guaifenesin 1200 mg (Mucinex Maximum Strength) plus/minus pseudoephedrine 120 mg  twice daily as needed with adequate hydration as discussed. Pseudoephedrine is only to be used for short-term relief of nasal/sinus congestion. Long-term use is discouraged due to potential side effects.  If allergen avoidance measures and medications fail to adequately relieve symptoms, aeroallergen immunotherapy will be considered.  Eustachian tube dysfunction  Treatment plan as outlined above for allergic rhinitis.  If this problem persists or progresses despite treatment plan as outlined above, further evaluation by otolaryngology, Dr.  Teoh, may be warranted.  GERD (gastroesophageal  reflux disease) The patient is interested in reducing/discontinuing proton pump inhibitor use due to potential side effects.  A prescription has been provided for famotidine (Pepcid) 20 mg twice daily.  If acid reflux is not well controlled with Pepcid, resume using omeprazole.  Continue appropriate reflux lifestyle modifications.   Meds ordered this encounter  Medications  . montelukast (SINGULAIR) 10 MG tablet    Sig: Take 1 tablet (10 mg total) by mouth at bedtime.    Dispense:  30 tablet    Refill:  5  . albuterol (PROAIR HFA) 108 (90 Base) MCG/ACT inhaler    Sig: Inhale 2 puffs into the lungs every 6 (six) hours as needed for wheezing or shortness of breath.    Dispense:  18 g    Refill:  1  . Azelastine-Fluticasone 137-50 MCG/ACT SUSP    Sig: Place 1 spray into the nose 2 (two) times daily as needed.    Dispense:  23 g    Refill:  5  . famotidine (PEPCID) 20 MG tablet    Sig: Take 1 tablet (20 mg total) by mouth 2 (two) times daily.    Dispense:  60 tablet    Refill:  5    Diagnostics: None.   Physical examination: Physical Exam Not obtained as encounter was done via telephone.   The following portions of the patient's history were reviewed and updated as appropriate: allergies, current medications, past family history, past medical history, past social history, past surgical history and problem list.  Allergies as of 04/10/2019   No Known Allergies     Medication List       Accurate as of April 10, 2019  5:23 PM. If you have any questions, ask your nurse or doctor.        STOP taking these medications   Vitamin D (Ergocalciferol) 1.25 MG (50000 UNIT) Caps capsule Commonly known as: DRISDOL Stopped by: Wellington Hampshire, MD     TAKE these medications   albuterol 108 (90 Base) MCG/ACT inhaler Commonly known as: ProAir HFA Inhale 2 puffs into the lungs every 6 (six) hours as needed for wheezing or shortness of breath.   ALPRAZolam 0.25 MG tablet Commonly  known as: XANAX TAKE 1 TABLET BY MOUTH ONCE DAILY AS NEEDED FOR ANXIETY   Azelastine HCl 0.15 % Soln Place 2 sprays into both nostrils 2 (two) times daily.   Azelastine-Fluticasone 137-50 MCG/ACT Susp Place 1 spray into the nose 2 (two) times daily as needed. Started by: Wellington Hampshire, MD   cetirizine 10 MG tablet Commonly known as: ZYRTEC Take 10 mg by mouth daily.   clotrimazole-betamethasone cream Commonly known as: Lotrisone Apply 1 application topically 2 (two) times daily.   cyanocobalamin 1000 MCG/ML injection Commonly known as: (VITAMIN B-12) INJECT 1 ML INTO THE MUSCLE EVERY 30 DAYS   famotidine 20 MG tablet Commonly known as: Pepcid Take 1 tablet (20 mg total) by mouth 2 (two) times daily. Started by: Wellington Hampshire, MD   Flovent HFA 110 MCG/ACT inhaler Generic drug: fluticasone Inhale 2 puffs into the lungs 2 (two) times daily.   furosemide 20 MG tablet Commonly known as: LASIX Take 1 tablet (20 mg total) by mouth daily.   hyoscyamine 0.125 MG SL tablet Commonly known as: LEVSIN SL DISSOLVE 1 TABLET UNDER THE TONGUE EVERY 4 HOURS AS NEEDED   metoprolol tartrate 50 MG tablet Commonly known as: LOPRESSOR TAKE 1/2 (ONE-HALF) TABLET BY MOUTH  TWICE DAILY **ANNUAL  APPOINTMENT  DUE  IN  OCTOBER  MUST  SEE  PROVIDER  FOR  FUTURE  REFILLS**   montelukast 10 MG tablet Commonly known as: SINGULAIR Take 1 tablet (10 mg total) by mouth at bedtime.   omeprazole 40 MG capsule Commonly known as: PRILOSEC Take 1 capsule by mouth once daily   sodium chloride 0.65 % Soln nasal spray Commonly known as: OCEAN Place 1 spray into both nostrils as needed for congestion.   SUMAtriptan 50 MG tablet Commonly known as: Imitrex Take 1 tablet (50 mg total) by mouth every 2 (two) hours as needed for migraine. May repeat in 2 hours if headache persists or recurs.   SYRINGE 3CC/25GX1-1/2" 25G X 1-1/2" 3 ML Misc Used to inject Vit B12   traMADol 50 MG tablet Commonly  known as: ULTRAM Take 1 tablet (50 mg total) by mouth at bedtime as needed.       No Known Allergies  Review of systems: Review of systems negative except as noted in HPI / PMHx.  Past Medical History:  Diagnosis Date  . Anemia   . Asthma   . Bulging disc   . Hypertension   . Obesity   . PONV (postoperative nausea and vomiting)    PT IS [place]    Family History  Problem Relation Age of Onset  . Breast cancer Mother   . Hypertension Sister   . Asthma Brother   . Allergic rhinitis Neg Hx   . Eczema Neg Hx   . Immunodeficiency Neg Hx   . Urticaria Neg Hx   . Angioedema Neg Hx     Social History   Socioeconomic History  . Marital status: Divorced    Spouse name: Not on file  . Number of children: Not on file  . Years of education: Not on file  . Highest education level: Not on file  Occupational History  . Not on file  Tobacco Use  . Smoking status: Never Smoker  . Smokeless tobacco: Never Used  Substance and Sexual Activity  . Alcohol use: No  . Drug use: No  . Sexual activity: Not on file  Other Topics Concern  . Not on file  Social History Narrative  . Not on file   Social Determinants of Health   Financial Resource Strain:   . Difficulty of Paying Living Expenses:   Food Insecurity:   . Worried About Programme researcher, broadcasting/film/video in the Last Year:   . Barista in the Last Year:   Transportation Needs:   . Freight forwarder (Medical):   Marland Kitchen Lack of Transportation (Non-Medical):   Physical Activity:   . Days of Exercise per Week:   . Minutes of Exercise per Session:   Stress:   . Feeling of Stress :   Social Connections:   . Frequency of Communication with Friends and Family:   . Frequency of Social Gatherings with Friends and Family:   . Attends Religious Services:   . Active Member of Clubs or Organizations:   . Attends Banker Meetings:   Marland Kitchen Marital Status:   Intimate Partner Violence:   . Fear of Current or  Ex-Partner:   . Emotionally Abused:   Marland Kitchen Physically Abused:   . Sexually Abused:     Previous notes and tests were reviewed.  I discussed the assessment and treatment plan with the patient. The patient was provided an opportunity to ask questions and all were  answered. The patient agreed with the plan and demonstrated an understanding of the instructions.   The patient was advised to call back or seek an in-person evaluation if the symptoms worsen or if the condition fails to improve as anticipated.  I provided 25 minutes of non-face-to-face time during this encounter.  I appreciate the opportunity to take part in [facility] care. Please do not hesitate to contact me with questions.  Sincerely,   R. Jorene Guest, MD

## 2019-04-10 NOTE — Assessment & Plan Note (Signed)
Stable.    Continue montelukast 10 mg daily at bedtime and albuterol HFA, 1 to 2 inhalations every 4-6 hours if needed.  A refill prescription has been provided for montelukast.  During respiratory tract infections or asthma flares, add Flovent 110g 3 inhalations via spacer device 3 times per day until symptoms have returned to baseline.  Subjective and objective measures of pulmonary function will be followed and the treatment plan will be adjusted accordingly. 

## 2019-05-04 ENCOUNTER — Other Ambulatory Visit: Payer: Self-pay

## 2019-05-04 ENCOUNTER — Ambulatory Visit: Payer: Self-pay

## 2019-05-04 ENCOUNTER — Ambulatory Visit (INDEPENDENT_AMBULATORY_CARE_PROVIDER_SITE_OTHER): Payer: BC Managed Care – PPO | Admitting: Family Medicine

## 2019-05-04 VITALS — BP 142/88 | HR 65 | Ht 64.0 in | Wt 307.0 lb

## 2019-05-04 DIAGNOSIS — M25561 Pain in right knee: Secondary | ICD-10-CM

## 2019-05-04 DIAGNOSIS — M79671 Pain in right foot: Secondary | ICD-10-CM

## 2019-05-04 DIAGNOSIS — M79672 Pain in left foot: Secondary | ICD-10-CM

## 2019-05-04 NOTE — Progress Notes (Signed)
Bruce Donath, am serving as a scribe for Dr. Antoine Primas. This visit occurred during the SARS-CoV-2 public health emergency.  Safety protocols were in place, including screening questions prior to the visit, additional usage of staff PPE, and extensive cleaning of exam room while observing appropriate contact time as indicated for disinfecting solutions.   04/02/2019 49 y.o. female with bilateral foot pain left longer than right.  Patient has some pronation with standing which likely exacerbates some loss of her longitudinal arch and foot DJD seen on x-ray.  Plan for scaphoid pads eccentric exercises and well fitted supportive footwear. Additionally will use Voltaren gel.  Work note written to allow patient to wear comfortable tennis shoes at work. Recheck back in about a month.  Return sooner if needed.  Update 05/04/2019 HENNESY SOBALVARRO is a 48 y.o. female who presents to [facility] at [facility] today for bilateral foot pain. Patient states that she has pain with prolonged weight bearing. Has been wearing tennis shoes to work with the scaphoid pads.   Also felt a pop in right knee at work when she twisted her knee. History of patellofemoral disorder/osteoarthritis right knee 2018. Was able to go to the gym that night and did both treadmill and strength exercises. Pain over patella but radiates into the calf and shin. Pain at rest and with activity. Had hard time sleeping on Tuesday and had to take Tramadol for pain.    Pertinent review of systems: No fevers or chills  Relevant historical information: Hypertension, obesity.   Exam:  BP (!) 142/88   Pulse 65   Ht 5\' 4"  (1.626 m)   Wt (!) 307 lb (139.3 kg)   SpO2 99%   BMI 52.70 kg/m  General: Well Developed, well nourished, and in no acute distress.   MSK: Right knee: Obese leg body habitus difficult to appreciate effusion no erythema. Range of motion 0-120 degrees with crepitation. Tender palpation medial  lateral and anterior joint. Stable ligamentous exam. Positive medial McMurray's test. Intact strength.    Lab and Radiology Results  Diagnostic Limited MSK Ultrasound of: Right knee Quad tendon intact appearing.  Moderate joint effusion present. Patellar tendon intact appearing Medial joint line narrowed with degenerative appearing meniscus. Lateral joint line narrowed degenerative.  Meniscus. Impression: Joint effusion DJD.  Probable degenerative meniscus tears   Procedure: Real-time Ultrasound Guided Injection of right knee Device: Philips Affiniti 50G Images permanently stored and available for review in the ultrasound unit. Verbal informed consent obtained.  Discussed risks and benefits of procedure. Warned about infection bleeding damage to structures skin hypopigmentation and fat atrophy among others. Patient expresses understanding and agreement Time-out conducted.   Noted no overlying erythema, induration, or other signs of local infection.   Skin prepped in a sterile fashion.   Local anesthesia: Topical Ethyl chloride.   With sterile technique and under real time ultrasound guidance:  40 mg of Kenalog and 3 mL of Marcaine injected easily.   Completed without difficulty   Pain partially resolved suggesting accurate placement of the medication.   Advised to call if fevers/chills, erythema, induration, drainage, or persistent bleeding.   Images permanently stored and available for review in the ultrasound unit.  Impression: Technically successful ultrasound guided injection.       Assessment and Plan: 49 y.o. female with new acute right knee pain. Patient has a history of DJD and probable degenerative meniscus tears.  Ultrasound today showed effusion and degenerative appearance of knee. She had  partial response to injection.  Hopeful that she will continue to have better response with the steroid component starts working.  Recheck if not improving.  Foot pain:  Moderately improved with pragmatic conservative management.  Continue management strategies recheck as needed for this issue.   PDMP not reviewed this encounter. Orders Placed This Encounter  Procedures  . [place] LIMITED JOINT SPACE STRUCTURES LOW RIGHT(NO LINKED CHARGES)    Order Specific Question:   Reason for Exam (SYMPTOM  OR DIAGNOSIS REQUIRED)    Answer:   right knee pain    Order Specific Question:   Preferred imaging location?    Answer:   [place]   No orders of the defined types were placed in this encounter.    Discussed warning signs or symptoms. Please see discharge instructions. Patient expresses understanding.   The above documentation has been reviewed and is accurate and complete Lynne Leader

## 2019-05-04 NOTE — Patient Instructions (Signed)
Thank you for coming in today. Call or go to the ER if you develop a large red swollen joint with extreme pain or oozing puss.  Continue the treatment for your feet.  Recheck with me as needed.

## 2019-05-11 ENCOUNTER — Other Ambulatory Visit: Payer: Self-pay | Admitting: Internal Medicine

## 2019-05-23 ENCOUNTER — Encounter: Payer: Self-pay | Admitting: Family Medicine

## 2019-05-24 ENCOUNTER — Encounter: Payer: Self-pay | Admitting: Family Medicine

## 2019-05-29 ENCOUNTER — Encounter: Payer: Self-pay | Admitting: Internal Medicine

## 2019-07-20 ENCOUNTER — Telehealth: Payer: Self-pay | Admitting: Internal Medicine

## 2019-07-20 MED ORDER — CLOTRIMAZOLE-BETAMETHASONE 1-0.05 % EX CREA: 1.0000 "application " | TOPICAL_CREAM | Freq: Two times a day (BID) | CUTANEOUS | 2 refills | Status: AC

## 2019-07-20 NOTE — Telephone Encounter (Signed)
New message:    1.Medication Requested: clotrimazole-betamethasone (LOTRISONE) cream 2. Pharmacy (Name, Street, [place]): [facility] - [place], [place] - [address] 3. On Med List: Yes  4. Last Visit with PCP: 11/02/18  5. Next visit date with PCP:   Agent: Please be advised that RX refills may take up to 3 business days. We ask that you follow-up with your pharmacy.

## 2019-08-03 ENCOUNTER — Encounter: Payer: Self-pay | Admitting: Internal Medicine

## 2019-08-03 ENCOUNTER — Other Ambulatory Visit: Payer: Self-pay

## 2019-08-03 ENCOUNTER — Ambulatory Visit (INDEPENDENT_AMBULATORY_CARE_PROVIDER_SITE_OTHER): Payer: BC Managed Care – PPO | Admitting: Internal Medicine

## 2019-08-03 DIAGNOSIS — M79645 Pain in left finger(s): Secondary | ICD-10-CM

## 2019-08-03 MED ORDER — HYDROCORTISONE (PERIANAL) 2.5 % EX CREA: 1.0000 "application " | TOPICAL_CREAM | Freq: Two times a day (BID) | CUTANEOUS | 0 refills | Status: DC

## 2019-08-03 MED ORDER — FLUCONAZOLE 150 MG PO TABS: 150.0000 mg | ORAL_TABLET | Freq: Once | ORAL | 0 refills | Status: AC

## 2019-08-03 MED ORDER — CEPHALEXIN 500 MG PO CAPS: 500.0000 mg | ORAL_CAPSULE | Freq: Two times a day (BID) | ORAL | 0 refills | Status: AC

## 2019-08-03 NOTE — Progress Notes (Signed)
   Subjective:   Patient ID: [name], female    DOB: Aug 09, 1970, 49 y.o.   MRN: [id number]  HPI The patient is a 49 YO female coming in for concern about dog bite. She did get tetanus shot after the incident. Her dog was outside and a stray pit bull attacked her dog. She called 911 who called animal control. They were able to retrieve the pit bull and verify that it was up to date on shots including rabies. This happened about 8 days ago and she is now noticing some pain and swelling in the finger which was bite. Originally she thought it was just grazed but there was a small puncture wound on the left 3rd finger.   Review of Systems  Constitutional: Negative.   HENT: Negative.   Eyes: Negative.   Respiratory: Negative for cough, chest tightness and shortness of breath.   Cardiovascular: Negative for chest pain, palpitations and leg swelling.  Gastrointestinal: Negative for abdominal distention, abdominal pain, constipation, diarrhea, nausea and vomiting.  Musculoskeletal: Negative.   Skin: Positive for color change.  Neurological: Negative.   Psychiatric/Behavioral: Negative.     Objective:  Physical Exam Constitutional:      Appearance: She is well-developed.  HENT:     Head: Normocephalic and atraumatic.  Cardiovascular:     Rate and Rhythm: Normal rate and regular rhythm.  Pulmonary:     Effort: Pulmonary effort is normal. No respiratory distress.     Breath sounds: Normal breath sounds. No wheezing or rales.  Abdominal:     General: Bowel sounds are normal. There is no distension.     Palpations: Abdomen is soft.     Tenderness: There is no abdominal tenderness. There is no rebound.  Musculoskeletal:     Cervical back: Normal range of motion.     Comments: Small scab left pointer finger with some swelling laterally along the finger and tracking to the mid metacarpal bones.   Skin:    General: Skin is warm and dry.  Neurological:     Mental Status: She is alert  and oriented to person, place, and time.     Coordination: Coordination normal.     Vitals:   08/03/19 1547  BP: 134/86  Pulse: 67  Temp: 98.7 F (37.1 C)  TempSrc: Oral  SpO2: 98%  Weight: 293 lb (132.9 kg)  Height: 5\' 4"  (1.626 m)   This visit occurred during the SARS-CoV-2 public health emergency.  Safety protocols were in place, including screening questions prior to the visit, additional usage of staff PPE, and extensive cleaning of exam room while observing appropriate contact time as indicated for disinfecting solutions.   Assessment & Plan:

## 2019-08-03 NOTE — Assessment & Plan Note (Signed)
Tdap up to date and she got another after incident. Needs rx keflex for possible emerging infection. Also rx diflucan in case of yeast infection.

## 2019-08-03 NOTE — Patient Instructions (Signed)
We have sent in the keflex 1 pill twice a day for 4 days.   We have sent in diflucan.   We have sent in cream to use on the hemorrhoid twice a day.

## 2019-08-06 ENCOUNTER — Telehealth: Payer: Self-pay

## 2019-08-06 NOTE — Telephone Encounter (Signed)
New message    Patient is requesting a letter for work.   Reason for letter: Seen in the office on  7.9.2021 and the reason why   If patient has not been seen for the reason they need to be out of work an appointment is needed to give an out of work note.

## 2019-08-06 NOTE — Telephone Encounter (Signed)
Letter has been done and pt has been informed via VM.

## 2019-08-06 NOTE — Telephone Encounter (Signed)
Can have note that she was seen 08/03/19 and can return to work

## 2019-08-13 ENCOUNTER — Encounter: Payer: Self-pay | Admitting: Allergy and Immunology

## 2019-08-13 ENCOUNTER — Other Ambulatory Visit: Payer: Self-pay

## 2019-08-13 ENCOUNTER — Ambulatory Visit (INDEPENDENT_AMBULATORY_CARE_PROVIDER_SITE_OTHER): Payer: BC Managed Care – PPO | Admitting: Allergy and Immunology

## 2019-08-13 DIAGNOSIS — H6983 Other specified disorders of Eustachian tube, bilateral: Secondary | ICD-10-CM

## 2019-08-13 DIAGNOSIS — K219 Gastro-esophageal reflux disease without esophagitis: Secondary | ICD-10-CM | POA: Diagnosis not present

## 2019-08-13 DIAGNOSIS — J4530 Mild persistent asthma, uncomplicated: Secondary | ICD-10-CM | POA: Diagnosis not present

## 2019-08-13 DIAGNOSIS — J3089 Other allergic rhinitis: Secondary | ICD-10-CM

## 2019-08-13 MED ORDER — MONTELUKAST SODIUM 10 MG PO TABS: 10.0000 mg | ORAL_TABLET | Freq: Every day | ORAL | 5 refills | Status: DC

## 2019-08-13 MED ORDER — XHANCE 93 MCG/ACT NA EXHU: INHALANT_SUSPENSION | NASAL | 5 refills | Status: AC

## 2019-08-13 NOTE — Patient Instructions (Addendum)
Mild persistent asthma Stable.    Continue montelukast 10 mg daily at bedtime and albuterol HFA, 1 to 2 inhalations every 4-6 hours if needed.  A refill prescription has been provided for montelukast.  During respiratory tract infections or asthma flares, add Flovent 110g 3 inhalations via spacer device 3 times per day until symptoms have returned to baseline.  Subjective and objective measures of pulmonary function will be followed and the treatment plan will be adjusted accordingly.  Perennial and seasonal allergic rhinitis  Continue appropriate allergen avoidance measures.  A prescription has been provided for [facility], 2 actuations per nostril twice a day as needed.   Continue azelastine nasal spray, 1-2 sprays per nostril twice daily as needed.   Nasal saline lavage (NeilMed) has been recommended as needed and prior to medicated nasal sprays along with instructions for proper administration.  For thick post nasal drainage, nasal congestion, and/or sinus pressure, add guaifenesin 1200 mg (Mucinex Maximum Strength) plus/minus pseudoephedrine 120 mg  twice daily as needed with adequate hydration as discussed. Pseudoephedrine is only to be used for short-term relief of nasal/sinus congestion. Long-term use is discouraged due to potential side effects.  If allergen avoidance measures and medications fail to adequately relieve symptoms, aeroallergen immunotherapy will be considered.  Eustachian tube dysfunction  Treatment plan as outlined above for allergic rhinitis.  If this problem persists or progresses despite treatment plan as outlined above, further evaluation by otolaryngology, Dr. Suszanne Conners, may be warranted.  GERD (gastroesophageal reflux disease)  Continue appropriate reflux lifestyle modifications.  Continue omeprazole 20 mg daily, 30 minutes prior to breakfast.  If needed, may add famotidine (Pepcid) 20 mg 1-2 times daily.   Return in about 4 months (around 12/14/2019),  or if symptoms worsen or fail to improve.

## 2019-08-13 NOTE — Assessment & Plan Note (Signed)
   Treatment plan as outlined above for allergic rhinitis.  If this problem persists or progresses despite treatment plan as outlined above, further evaluation by otolaryngology, Dr. Suszanne Conners, may be warranted.

## 2019-08-13 NOTE — Progress Notes (Signed)
Follow-up Telemedicine Note  RE: [name] MRN: [id number] DOB: 11-Mar-1970 Date of Telemedicine Visit: 08/13/2019  Primary care provider: Myrlene Broker, MD Referring provider: Myrlene Broker, *  Telemedicine Follow Up Visit via Telephone: I connected with [name] for a follow up on 08/13/19 by telephone and verified that I am speaking with the correct person using two identifiers.   The limitations, risks, security and privacy concerns of performing an evaluation and management service by telemedicine, the availability of in person appointments, and that there may be a patient responsible charge related to this service were discussed. The patient expressed understanding and agreed to proceed.  Patient is at home.  Provider is at the office. Visit start time: 3:27 PM Visit end time: 4:02 PM Insurance consent/check in by: Fleet Contras. Medical consent and medical assistant/nurse: Elon Jester  History of present illness: [name] is a 49 y.o. female with persistent asthma, allergic rhinitis, and gastroesophageal reflux presenting today via telemedicine for follow-up.  She was last evaluated on April 10, 2019 via telemedicine.  She reports that if she misses 2 or more doses of montelukast she "can tell the difference."  While taking the montelukast daily her asthma has been well controlled.  She reports that she has not required albuterol rescue in approximately 2 months and denies limitations in normal daily activities and nocturnal awakenings due to lower respiratory symptoms.  She reports that her nasal allergy symptoms are relatively well controlled with azelastine nasal spray as needed.  However, she reports that her right ear is "always has a stopped up feeling."  In addition, she complains of some postnasal drainage causing throat irritation.  She reports that her acid reflux is adequately controlled with omeprazole 20 mg daily.  If she misses a dose or 2 she  experiences "severe heartburn" at nighttime.  Assessment and plan: Mild persistent asthma Stable.    Continue montelukast 10 mg daily at bedtime and albuterol HFA, 1 to 2 inhalations every 4-6 hours if needed.  A refill prescription has been provided for montelukast.  During respiratory tract infections or asthma flares, add Flovent 110g 3 inhalations via spacer device 3 times per day until symptoms have returned to baseline.  Subjective and objective measures of pulmonary function will be followed and the treatment plan will be adjusted accordingly.  Perennial and seasonal allergic rhinitis  Continue appropriate allergen avoidance measures.  A prescription has been provided for [facility], 2 actuations per nostril twice a day as needed.   Continue azelastine nasal spray, 1-2 sprays per nostril twice daily as needed.   Nasal saline lavage (NeilMed) has been recommended as needed and prior to medicated nasal sprays along with instructions for proper administration.  For thick post nasal drainage, nasal congestion, and/or sinus pressure, add guaifenesin 1200 mg (Mucinex Maximum Strength) plus/minus pseudoephedrine 120 mg  twice daily as needed with adequate hydration as discussed. Pseudoephedrine is only to be used for short-term relief of nasal/sinus congestion. Long-term use is discouraged due to potential side effects.  If allergen avoidance measures and medications fail to adequately relieve symptoms, aeroallergen immunotherapy will be considered.  Eustachian tube dysfunction  Treatment plan as outlined above for allergic rhinitis.  If this problem persists or progresses despite treatment plan as outlined above, further evaluation by otolaryngology, Dr. Suszanne Conners, may be warranted.  GERD (gastroesophageal reflux disease)  Continue appropriate reflux lifestyle modifications.  Continue omeprazole 20 mg daily, 30 minutes prior to breakfast.  If needed, may add famotidine (  Pepcid) 20 mg  1-2 times daily.   Meds ordered this encounter  Medications  . montelukast (SINGULAIR) 10 MG tablet    Sig: Take 1 tablet (10 mg total) by mouth at bedtime.    Dispense:  30 tablet    Refill:  5  . Fluticasone Propionate (XHANCE) 93 MCG/ACT EXHU    Sig: 2 sprays per nostril twice daily as needed for stuffy nose.    Dispense:  32 mL    Refill:  5    Diagnostics: None.   Physical examination: Physical Exam Not obtained as encounter was done via telephone.   The following portions of the patient's history were reviewed and updated as appropriate: allergies, current medications, past family history, past medical history, past social history, past surgical history and problem list.  Allergies as of 08/13/2019   No Known Allergies     Medication List       Accurate as of August 13, 2019  5:06 PM. If you have any questions, ask your nurse or doctor.        STOP taking these medications   Azelastine HCl 0.15 % Soln Stopped by: Wellington Hampshire, MD   cyanocobalamin 1000 MCG/ML injection Commonly known as: (VITAMIN B-12) Stopped by: Wellington Hampshire, MD   famotidine 20 MG tablet Commonly known as: Pepcid Stopped by: Wellington Hampshire, MD     TAKE these medications   albuterol 108 (90 Base) MCG/ACT inhaler Commonly known as: ProAir HFA Inhale 2 puffs into the lungs every 6 (six) hours as needed for wheezing or shortness of breath.   ALPRAZolam 0.25 MG tablet Commonly known as: XANAX TAKE 1 TABLET BY MOUTH ONCE DAILY AS NEEDED FOR ANXIETY   Azelastine-Fluticasone 137-50 MCG/ACT Susp Place 1 spray into the nose 2 (two) times daily as needed.   cetirizine 10 MG tablet Commonly known as: ZYRTEC Take 10 mg by mouth daily.   clotrimazole-betamethasone cream Commonly known as: Lotrisone Apply 1 application topically 2 (two) times daily.   Flovent HFA 110 MCG/ACT inhaler Generic drug: fluticasone Inhale 2 puffs into the lungs 2 (two) times daily.   furosemide 20 MG  tablet Commonly known as: LASIX Take 1 tablet (20 mg total) by mouth daily.   hydrocortisone 2.5 % rectal cream Commonly known as: Anusol-HC Place 1 application rectally 2 (two) times daily.   hyoscyamine 0.125 MG SL tablet Commonly known as: LEVSIN SL DISSOLVE 1 TABLET UNDER THE TONGUE EVERY 4 HOURS AS NEEDED   metoprolol tartrate 50 MG tablet Commonly known as: LOPRESSOR Take 0.5 tablets (25 mg total) by mouth 2 (two) times daily. Annual appt due in Oct must see provider for future refills   montelukast 10 MG tablet Commonly known as: SINGULAIR Take 1 tablet (10 mg total) by mouth at bedtime.   omeprazole 40 MG capsule Commonly known as: PRILOSEC Take 1 capsule by mouth once daily   sodium chloride 0.65 % Soln nasal spray Commonly known as: OCEAN Place 1 spray into both nostrils as needed for congestion.   SUMAtriptan 50 MG tablet Commonly known as: Imitrex Take 1 tablet (50 mg total) by mouth every 2 (two) hours as needed for migraine. May repeat in 2 hours if headache persists or recurs.   SYRINGE 3CC/25GX1-1/2" 25G X 1-1/2" 3 ML Misc Used to inject Vit B12   traMADol 50 MG tablet Commonly known as: ULTRAM Take 1 tablet (50 mg total) by mouth at bedtime as needed.   Xhance 93 MCG/ACT Exhu Generic  drug: Fluticasone Propionate 2 sprays per nostril twice daily as needed for stuffy nose. Started by: Wellington Hampshire, MD       No Known Allergies  Review of systems: Review of systems negative except as noted in HPI / PMHx.  Past Medical History:  Diagnosis Date  . Anemia   . Asthma   . Bulging disc   . Hypertension   . Obesity   . PONV (postoperative nausea and vomiting)    PT IS [place]    Family History  Problem Relation Age of Onset  . Breast cancer Mother   . Hypertension Sister   . Asthma Brother   . Allergic rhinitis Neg Hx   . Eczema Neg Hx   . Immunodeficiency Neg Hx   . Urticaria Neg Hx   . Angioedema Neg Hx     Social History    Socioeconomic History  . Marital status: Divorced    Spouse name: Not on file  . Number of children: Not on file  . Years of education: Not on file  . Highest education level: Not on file  Occupational History  . Not on file  Tobacco Use  . Smoking status: Never Smoker  . Smokeless tobacco: Never Used  Substance and Sexual Activity  . Alcohol use: No  . Drug use: No  . Sexual activity: Not on file  Other Topics Concern  . Not on file  Social History Narrative  . Not on file   Social Determinants of Health   Financial Resource Strain:   . Difficulty of Paying Living Expenses:   Food Insecurity:   . Worried About Programme researcher, broadcasting/film/video in the Last Year:   . Barista in the Last Year:   Transportation Needs:   . Freight forwarder (Medical):   Marland Kitchen Lack of Transportation (Non-Medical):   Physical Activity:   . Days of Exercise per Week:   . Minutes of Exercise per Session:   Stress:   . Feeling of Stress :   Social Connections:   . Frequency of Communication with Friends and Family:   . Frequency of Social Gatherings with Friends and Family:   . Attends Religious Services:   . Active Member of Clubs or Organizations:   . Attends Banker Meetings:   Marland Kitchen Marital Status:   Intimate Partner Violence:   . Fear of Current or Ex-Partner:   . Emotionally Abused:   Marland Kitchen Physically Abused:   . Sexually Abused:     Previous notes and tests were reviewed.  I discussed the assessment and treatment plan with the patient. The patient was provided an opportunity to ask questions and all were answered. The patient agreed with the plan and demonstrated an understanding of the instructions.   The patient was advised to call back or seek an in-person evaluation if the symptoms worsen or if the condition fails to improve as anticipated.  I provided 30 minutes of non-face-to-face time during this encounter.  I appreciate the opportunity to take part in [facility] care.  Please do not hesitate to contact me with questions.  Sincerely,   R. Jorene Guest, MD

## 2019-08-13 NOTE — Assessment & Plan Note (Signed)
   Continue appropriate allergen avoidance measures.  A prescription has been provided for [facility], 2 actuations per nostril twice a day as needed.   Continue azelastine nasal spray, 1-2 sprays per nostril twice daily as needed.   Nasal saline lavage (NeilMed) has been recommended as needed and prior to medicated nasal sprays along with instructions for proper administration.  For thick post nasal drainage, nasal congestion, and/or sinus pressure, add guaifenesin 1200 mg (Mucinex Maximum Strength) plus/minus pseudoephedrine 120 mg  twice daily as needed with adequate hydration as discussed. Pseudoephedrine is only to be used for short-term relief of nasal/sinus congestion. Long-term use is discouraged due to potential side effects.  If allergen avoidance measures and medications fail to adequately relieve symptoms, aeroallergen immunotherapy will be considered.

## 2019-08-13 NOTE — Assessment & Plan Note (Signed)
   Continue appropriate reflux lifestyle modifications.  Continue omeprazole 20 mg daily, 30 minutes prior to breakfast.  If needed, may add famotidine (Pepcid) 20 mg 1-2 times daily.

## 2019-08-13 NOTE — Assessment & Plan Note (Signed)
Stable.    Continue montelukast 10 mg daily at bedtime and albuterol HFA, 1 to 2 inhalations every 4-6 hours if needed.  A refill prescription has been provided for montelukast.  During respiratory tract infections or asthma flares, add Flovent 110g 3 inhalations via spacer device 3 times per day until symptoms have returned to baseline.  Subjective and objective measures of pulmonary function will be followed and the treatment plan will be adjusted accordingly.

## 2019-08-16 ENCOUNTER — Telehealth: Payer: Self-pay | Admitting: Internal Medicine

## 2019-08-16 ENCOUNTER — Other Ambulatory Visit: Payer: Self-pay | Admitting: Internal Medicine

## 2019-08-16 DIAGNOSIS — K2100 Gastro-esophageal reflux disease with esophagitis, without bleeding: Secondary | ICD-10-CM

## 2019-08-16 MED ORDER — GAVISCON 95-358 MG/15ML PO SUSP: 15.0000 mL | Freq: Three times a day (TID) | ORAL | 1 refills | Status: DC

## 2019-08-16 NOTE — Telephone Encounter (Signed)
New Message:   Pt is asking if a GI cocktail can be prescribed to her for her stomach or is it only something they give in the hospital. She is aware that Dr.Crawford is out of the office and she does have and appt on Monday with her. Please advise.

## 2019-08-16 NOTE — Telephone Encounter (Signed)
Called pt, LVM with details below  

## 2019-08-20 ENCOUNTER — Ambulatory Visit (INDEPENDENT_AMBULATORY_CARE_PROVIDER_SITE_OTHER): Payer: BC Managed Care – PPO | Admitting: Internal Medicine

## 2019-08-20 ENCOUNTER — Encounter: Payer: Self-pay | Admitting: Internal Medicine

## 2019-08-20 ENCOUNTER — Other Ambulatory Visit: Payer: Self-pay

## 2019-08-20 DIAGNOSIS — K2100 Gastro-esophageal reflux disease with esophagitis, without bleeding: Secondary | ICD-10-CM | POA: Diagnosis not present

## 2019-08-20 DIAGNOSIS — Z9884 Bariatric surgery status: Secondary | ICD-10-CM

## 2019-08-20 DIAGNOSIS — R1013 Epigastric pain: Secondary | ICD-10-CM

## 2019-08-20 NOTE — Patient Instructions (Signed)
We will check the labs today and check a CT scan of the stomach.   Try doubling the omeprazole for the next 1-2 weeks.

## 2019-08-20 NOTE — Progress Notes (Signed)
   Subjective:   Patient ID: [name], female    DOB: 10/29/1970, 49 y.o.   MRN: [id number]  HPI The patient is a 49 YO female coming in for concerns about upper abdomen pain. She has history of gastric bypass years ago. She noticed within the last week or so pain in the upper middle and right upper abdomen. Prior gallbladder removal. She has been taking maalox and hyoscyamine for the pain with some relief. She denies regurgitation of food. Some mild nausea no vomiting. Denies pain changing with eating but sometimes pain will be bad and prevent her from eating. She has been eating less in the last week. Still drinking liquids. She did recently in the last 2 weeks take an antibiotic and was not sure if that could be related. Also in the last month has done thrive to help with weight loss. Stopped that supplement about 4-5 days ago and this has not helped much. Overall stable in the last week. Comes and goes. Does not radiate to the back or lower stomach. Some constipation in the last several weeks but has been going 1-2 times daily for the last several days. Denies current constipation or diarrhea or blood in stool.   PMH, [facility], social history reviewed and updated  Review of Systems  Constitutional: Negative.   HENT: Negative.   Eyes: Negative.   Respiratory: Negative for cough, chest tightness and shortness of breath.   Cardiovascular: Negative for chest pain, palpitations and leg swelling.  Gastrointestinal: Positive for abdominal pain and nausea. Negative for abdominal distention, anal bleeding, blood in stool, constipation, diarrhea, rectal pain and vomiting.  Musculoskeletal: Negative.   Skin: Negative.   Neurological: Negative.   Psychiatric/Behavioral: Negative.     Objective:  Physical Exam Constitutional:      Appearance: She is well-developed. She is obese.  HENT:     Head: Normocephalic and atraumatic.  Cardiovascular:     Rate and Rhythm: Normal rate and regular rhythm.   Pulmonary:     Effort: Pulmonary effort is normal. No respiratory distress.     Breath sounds: Normal breath sounds. No wheezing or rales.  Abdominal:     General: Bowel sounds are normal. There is no distension.     Palpations: Abdomen is soft. There is no mass.     Tenderness: There is abdominal tenderness. There is no guarding or rebound.     Hernia: No hernia is present.     Comments: No pain over the lower pannus, pain in the midline epigastric region and right upper quadrant  Musculoskeletal:     Cervical back: Normal range of motion.  Skin:    General: Skin is warm and dry.  Neurological:     Mental Status: She is alert and oriented to person, place, and time.     Coordination: Coordination normal.     Vitals:   08/20/19 1550  BP: 124/76  Pulse: 57  Temp: 98.6 F (37 C)  TempSrc: Oral  SpO2: 97%  Weight: (!) 298 lb (135.2 kg)  Height: 5\' 4"  (1.626 m)    This visit occurred during the SARS-CoV-2 public health emergency.  Safety protocols were in place, including screening questions prior to the visit, additional usage of staff PPE, and extensive cleaning of exam room while observing appropriate contact time as indicated for disinfecting solutions.   Assessment & Plan:

## 2019-08-21 DIAGNOSIS — R1013 Epigastric pain: Secondary | ICD-10-CM | POA: Insufficient documentation

## 2019-08-21 LAB — COMPREHENSIVE METABOLIC PANEL
AG Ratio: 1.6 (calc) (ref 1.0–2.5)
ALT: 14 U/L (ref 6–29)
AST: 18 U/L (ref 10–35)
Albumin: 3.6 g/dL (ref 3.6–5.1)
Alkaline phosphatase (APISO): 66 U/L (ref 31–125)
BUN: 10 mg/dL (ref 7–25)
CO2: 26 mmol/L (ref 20–32)
Calcium: 8.8 mg/dL (ref 8.6–10.2)
Chloride: 108 mmol/L (ref 98–110)
Creat: 0.68 mg/dL (ref 0.50–1.10)
Globulin: 2.3 g/dL (calc) (ref 1.9–3.7)
Glucose, Bld: 87 mg/dL (ref 65–99)
Potassium: 4.2 mmol/L (ref 3.5–5.3)
Sodium: 139 mmol/L (ref 135–146)
Total Bilirubin: 0.4 mg/dL (ref 0.2–1.2)
Total Protein: 5.9 g/dL — ABNORMAL LOW (ref 6.1–8.1)

## 2019-08-21 LAB — CBC
HCT: 35.3 % (ref 35.0–45.0)
Hemoglobin: 9.9 g/dL — ABNORMAL LOW (ref 11.7–15.5)
MCH: 21 pg — ABNORMAL LOW (ref 27.0–33.0)
MCHC: 28 g/dL — ABNORMAL LOW (ref 32.0–36.0)
MCV: 74.8 fL — ABNORMAL LOW (ref 80.0–100.0)
MPV: 10.3 fL (ref 7.5–12.5)
Platelets: 280 10*3/uL (ref 140–400)
RBC: 4.72 10*6/uL (ref 3.80–5.10)
RDW: 15.8 % — ABNORMAL HIGH (ref 11.0–15.0)
WBC: 6.5 10*3/uL (ref 3.8–10.8)

## 2019-08-21 LAB — LIPASE: Lipase: 44 U/L (ref 7–60)

## 2019-08-21 NOTE — Assessment & Plan Note (Signed)
Needs CT scan to rule out adhesions or blockage given new onset pain and nausea.

## 2019-08-21 NOTE — Assessment & Plan Note (Signed)
Could be flare due to recent change in diet and antibiotics. Will double omeprazole while pending further work up.

## 2019-08-21 NOTE — Assessment & Plan Note (Signed)
Prior gallbladder removal. Prior gastric bypass could be contributing and could have adhesions, hernia, malfunction of the prior surgery. Needs CBC, CMP, lipase and CT abdomen and pelvis with contrast to evaluate fully. Will double omeprazole in the meantime to see if this helps at all.

## 2019-08-22 ENCOUNTER — Encounter: Payer: Self-pay | Admitting: Internal Medicine

## 2019-08-24 ENCOUNTER — Encounter: Payer: Self-pay | Admitting: Internal Medicine

## 2019-09-14 ENCOUNTER — Ambulatory Visit
Admission: RE | Admit: 2019-09-14 | Discharge: 2019-09-14 | Disposition: A | Discharge status: Home / Self Care | Payer: BC Managed Care – PPO | Source: Ambulatory Visit | Attending: Internal Medicine | Admitting: Internal Medicine

## 2019-09-14 ENCOUNTER — Other Ambulatory Visit: Payer: Self-pay

## 2019-09-14 DIAGNOSIS — R1013 Epigastric pain: Secondary | ICD-10-CM

## 2019-09-14 MED ORDER — IOPAMIDOL (ISOVUE-300) INJECTION 61%
125.0000 mL | Freq: Once | INTRAVENOUS | Status: AC | PRN
  Administered 2019-09-14: 125 mL via INTRAVENOUS

## 2019-09-19 ENCOUNTER — Encounter: Payer: Self-pay | Admitting: Internal Medicine

## 2019-09-19 ENCOUNTER — Encounter: Payer: Self-pay | Admitting: Family Medicine

## 2019-09-19 ENCOUNTER — Other Ambulatory Visit: Payer: Self-pay | Admitting: Family

## 2019-09-19 DIAGNOSIS — K439 Ventral hernia without obstruction or gangrene: Secondary | ICD-10-CM

## 2019-09-20 ENCOUNTER — Encounter: Payer: Self-pay | Admitting: Family Medicine

## 2019-10-02 IMAGING — MR MR HEAD W/O CM
9 of 10 series · 42 of 48 positions shown · non-contrast
Comparison: None.

CLINICAL DATA: New persistent daily headache

EXAM:
MRI HEAD WITHOUT CONTRAST
TECHNIQUE: Multiplanar, multiecho pulse sequences of the brain and surrounding
structures were obtained without intravenous contrast.

[Series 2: T1 · sagittal · 5.0mm · 0.45mm/px · 3 of 23 slices shown]
[im 1/23]
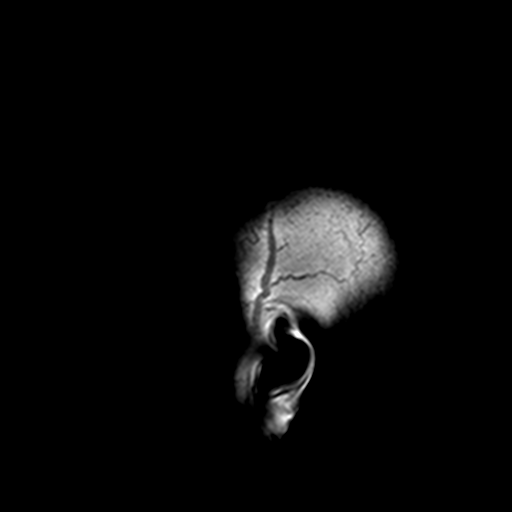
[im 12/23]
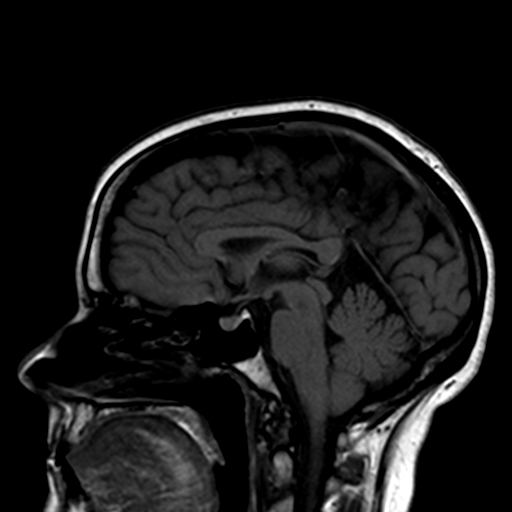
[im 23/23]
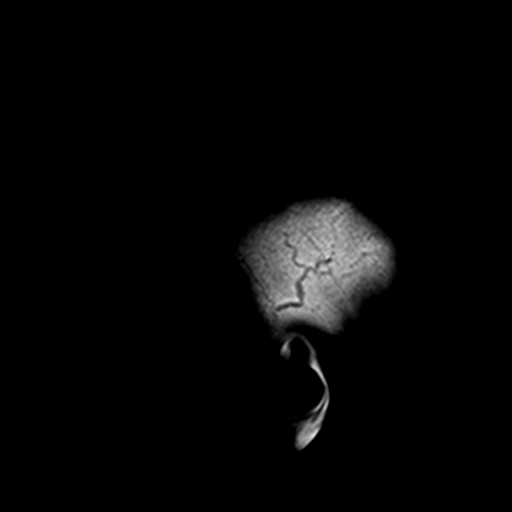

[Series 3: DWI · axial · 3.0mm · 1.86mm/px · z∈[-73,+69]mm · 10 of 82 slices shown (1 of 4)]
[im 1/82]
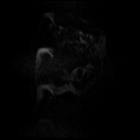
[im 10/82]
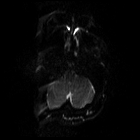
[im 19/82]
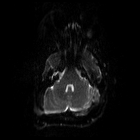
[im 28/82]
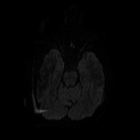
[im 37/82]
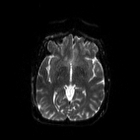
[im 46/82]
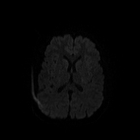
[im 55/82]
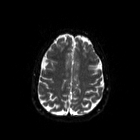
[im 64/82]
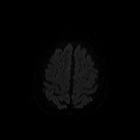
[im 73/82]
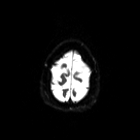
[im 82/82]
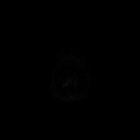

[Series 4: DWI · axial · 3.0mm · 1.86mm/px · z∈[-77,+69]mm · 4 of 39 slices shown (2 of 4)]
[im 1/39]
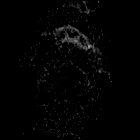
[im 13/39]
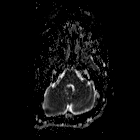
[im 26/39]
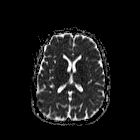
[im 39/39]
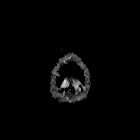

[Series 5: DWI · coronal · 3.0mm · 1.46mm/px · 10 of 96 slices shown (3 of 4)]
[im 1/96]
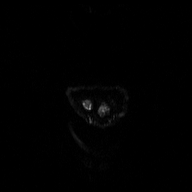
[im 11/96]
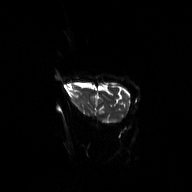
[im 22/96]
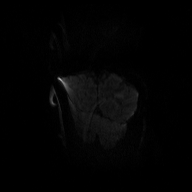
[im 32/96]
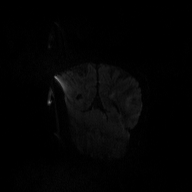
[im 43/96]
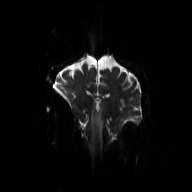
[im 53/96]
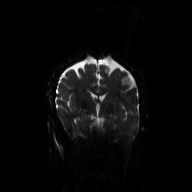
[im 64/96]
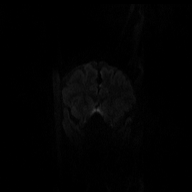
[im 74/96]
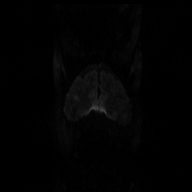
[im 85/96]
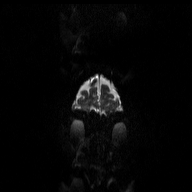
[im 96/96]
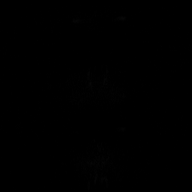

[Series 6: DWI · coronal · 3.0mm · 1.46mm/px · 5 of 47 slices shown (4 of 4)]
[im 1/47]
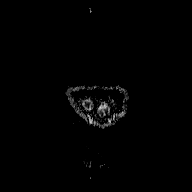
[im 12/47]
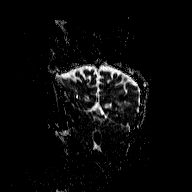
[im 24/47]
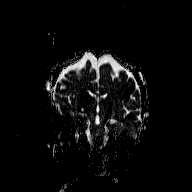
[im 35/47]
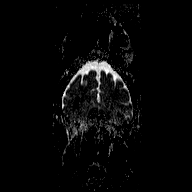
[im 47/47]
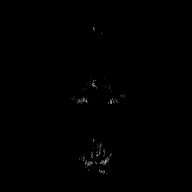

[Series 7: T2 · axial · 5.0mm · 0.45mm/px · z∈[-84,+70]mm · 2 of 23 slices shown (1 of 2)]
[im 1/23]
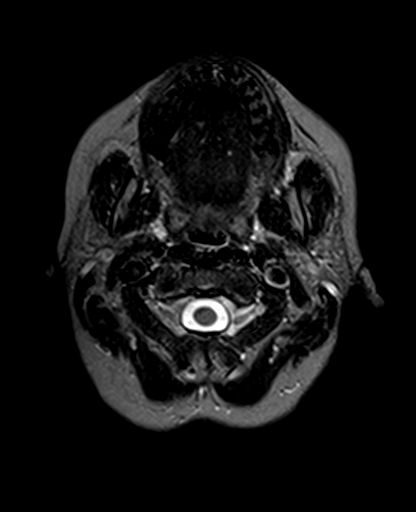
[im 23/23]
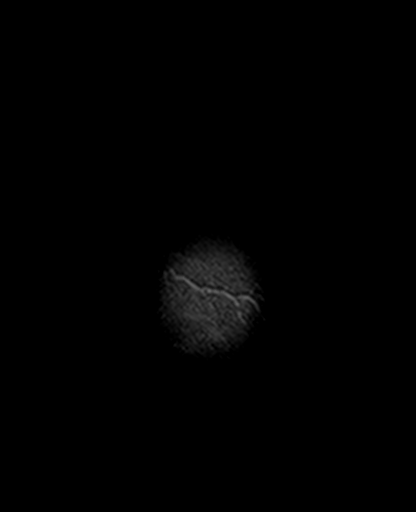

[Series 8: T2 · axial · 5.0mm · 0.45mm/px · z∈[-84,+70]mm · 2 of 23 slices shown (2 of 2)]
[im 1/23]
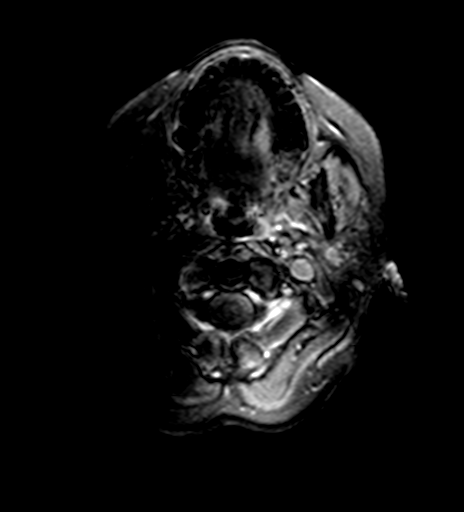
[im 23/23]
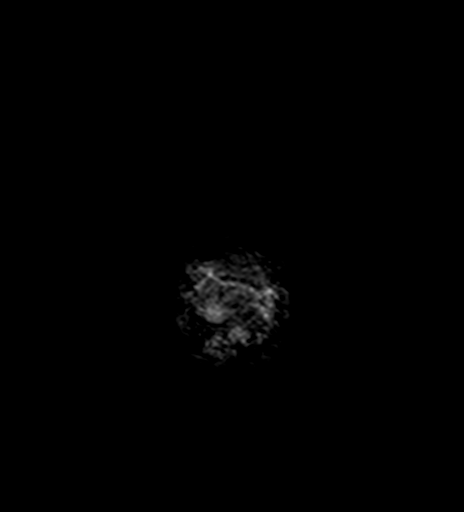

[Series 9: FLAIR · axial · 4.0mm · 0.45mm/px · z∈[-85,+71]mm · 3 of 27 slices shown]
[im 1/27]
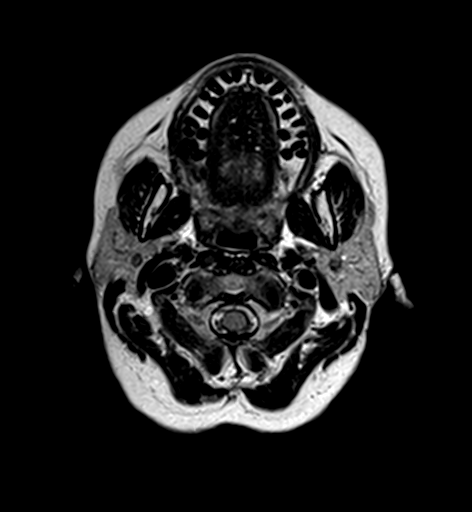
[im 14/27]
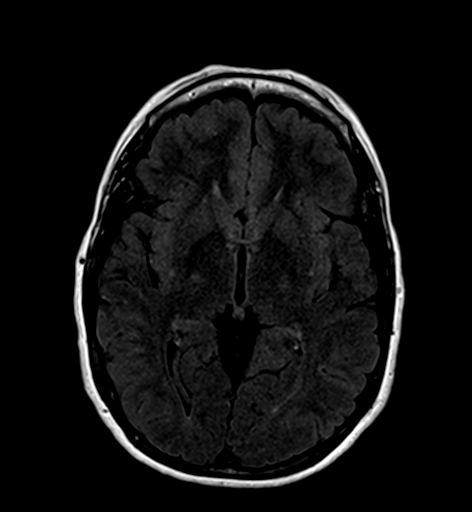
[im 27/27]
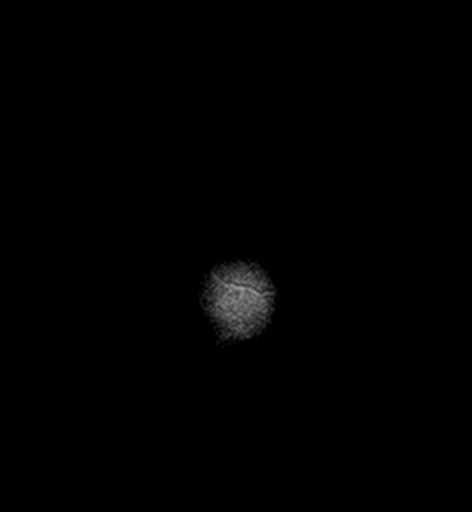

[Series 11: T2 post-contrast · coronal · 5.0mm · 0.45mm/px · 3 of 28 slices shown]
[im 1/28]
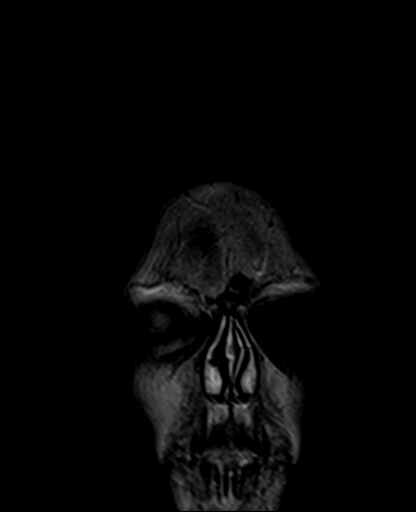
[im 14/28]
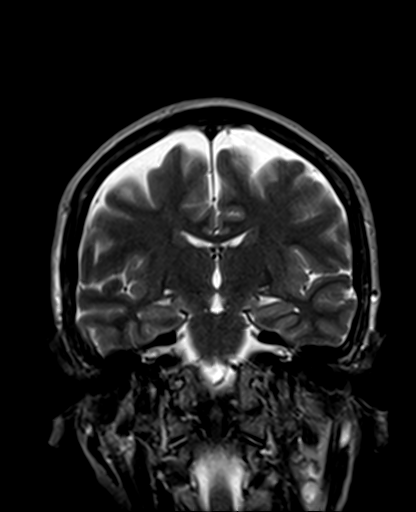
[im 28/28]
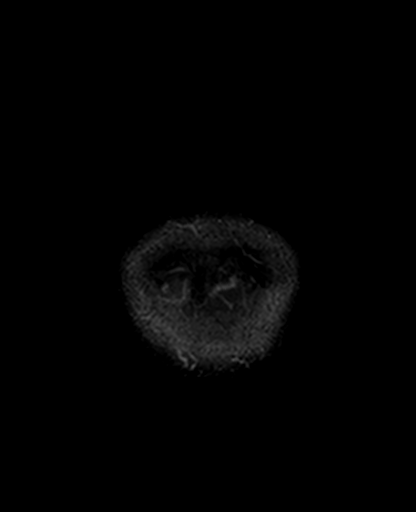

[42 of 48 positions shown; findings below may reference images not displayed]

FINDINGS: Brain: No acute infarction, hemorrhage, hydrocephalus, extra-axial
collection or mass lesion.

Vascular: Normal arterial flow voids

Skull and upper cervical spine: Negative

Sinuses/Orbits: Mild mucosal edema paranasal sinuses.  Normal orbit

Other: None
IMPRESSION: Normal MRI head

Mild sinus mucosal disease.

## 2019-10-09 ENCOUNTER — Encounter: Payer: Self-pay | Admitting: Internal Medicine

## 2019-10-10 NOTE — Telephone Encounter (Signed)
Spoke to pt

## 2019-11-02 ENCOUNTER — Ambulatory Visit: Payer: Self-pay | Admitting: Surgery

## 2019-11-02 NOTE — H&P (Signed)
Surgical Evaluation   Chief Complaint: abdominal pain  HPI: Very nice 49 yo woman with chief complain of abdominal pain. This is located superior and to the right of the umbilicus. This has been going on for a couple years and she always thought it was her gastric pouch, but in the last few months it has become worse and more frequent and does not seem to be associated with eating. This is abdominal wall pain and is aggravated by certain movements, bending, and lifting. She works at [employer] and has to reach for and carry boxes of files and this seems to make the pain worse. Denies associated gi issues such as bloating, constipation, or obstructive symptoms. She does indorse reflux/ regurgitation which is well controlled on recently-initiated PPI.  She has had an open gastric bypass in 2004 in [facility] by a Dr. Jayme Cloud, an open cholecystectomy, and a laparoscopic hysterectomy/sacrocolpopexy by Dr Marlou Porch a few years ago. She thinks she had an incidental umbilical hernia repair during one of these operations.  She is also inquiring about panniculectomy.  No Known Allergies  Past Medical History:  Diagnosis Date   Anemia    Asthma    Bulging disc    Hypertension    Obesity    PONV (postoperative nausea and vomiting)    PT IS [facility]    Past Surgical History:  Procedure Laterality Date   CHOLECYSTECTOMY     DILITATION & CURRETTAGE/HYSTROSCOPY WITH NOVASURE ABLATION N/A 09/20/2013   Procedure: DILATATION & CURETTAGE/HYSTEROSCOPY WITH NOVASURE ABLATION;  Surgeon: Juluis Mire, MD;  Location: [facility];  Service: Gynecology;  Laterality: N/A;   GASTRIC BYPASS     LAPAROSCOPIC TUBAL LIGATION Bilateral 09/20/2013   Procedure: LAPAROSCOPIC TUBAL LIGATION;  Surgeon: Juluis Mire, MD;  Location: [facility];  Service: Gynecology;  Laterality: Bilateral;    Family History  Problem Relation Age of Onset   Breast cancer Mother    Hypertension Sister    Asthma Brother    Allergic  rhinitis Neg Hx    Eczema Neg Hx    Immunodeficiency Neg Hx    Urticaria Neg Hx    Angioedema Neg Hx     Social History   Socioeconomic History   Marital status: Divorced    Spouse name: Not on file   Number of children: Not on file   Years of education: Not on file   Highest education level: Not on file  Occupational History   Not on file  Tobacco Use   Smoking status: Never Smoker   Smokeless tobacco: Never Used  Substance and Sexual Activity   Alcohol use: No   Drug use: No   Sexual activity: Not on file  Other Topics Concern   Not on file  Social History Narrative   Not on file   Social Determinants of Health   Financial Resource Strain:    Difficulty of Paying Living Expenses: Not on file  Food Insecurity:    Worried About Running Out of Food in the Last Year: Not on file   Ran Out of Food in the Last Year: Not on file  Transportation Needs:    Lack of Transportation (Medical): Not on file   Lack of Transportation (Non-Medical): Not on file  Physical Activity:    Days of Exercise per Week: Not on file   Minutes of Exercise per Session: Not on file  Stress:    Feeling of Stress : Not on file  Social Connections:  Frequency of Communication with Friends and Family: Not on file   Frequency of Social Gatherings with Friends and Family: Not on file   Attends Religious Services: Not on file   Active Member of Clubs or Organizations: Not on file   Attends Banker Meetings: Not on file   Marital Status: Not on file    Current Outpatient Medications on File Prior to Visit  Medication Sig Dispense Refill   albuterol (PROAIR HFA) 108 (90 Base) MCG/ACT inhaler Inhale 2 puffs into the lungs every 6 (six) hours as needed for wheezing or shortness of breath. 18 g 1   ALPRAZolam (XANAX) 0.25 MG tablet TAKE 1 TABLET BY MOUTH ONCE DAILY AS NEEDED FOR ANXIETY 30 tablet 3   aluminum hydroxide-magnesium carbonate (GAVISCON) 95-358 MG/15ML SUSP Take 15 mLs by  mouth 3 (three) times daily after meals. 355 mL 1   Azelastine-Fluticasone 137-50 MCG/ACT SUSP Place 1 spray into the nose 2 (two) times daily as needed. 23 g 5   cetirizine (ZYRTEC) 10 MG tablet Take 10 mg by mouth daily.     clotrimazole-betamethasone (LOTRISONE) cream Apply 1 application topically 2 (two) times daily. 100 g 2   fluticasone (FLOVENT HFA) 110 MCG/ACT inhaler Inhale 2 puffs into the lungs 2 (two) times daily. 1 Inhaler 5   Fluticasone Propionate (XHANCE) 93 MCG/ACT EXHU 2 sprays per nostril twice daily as needed for stuffy nose. 32 mL 5   furosemide (LASIX) 20 MG tablet Take 1 tablet (20 mg total) by mouth daily. 30 tablet 3   hydrocortisone (ANUSOL-HC) 2.5 % rectal cream Place 1 application rectally 2 (two) times daily. 100 g 0   hyoscyamine (LEVSIN SL) 0.125 MG SL tablet DISSOLVE 1 TABLET UNDER THE TONGUE EVERY 4 HOURS AS NEEDED 30 tablet 0   metoprolol tartrate (LOPRESSOR) 50 MG tablet Take 0.5 tablets (25 mg total) by mouth 2 (two) times daily. Annual appt due in Oct must see provider for future refills 90 tablet 1   montelukast (SINGULAIR) 10 MG tablet Take 1 tablet (10 mg total) by mouth at bedtime. 30 tablet 5   omeprazole (PRILOSEC) 40 MG capsule Take 1 capsule by mouth once daily 90 capsule 2   sodium chloride (OCEAN) 0.65 % SOLN nasal spray Place 1 spray into both nostrils as needed for congestion. 480 mL 0   SUMAtriptan (IMITREX) 50 MG tablet Take 1 tablet (50 mg total) by mouth every 2 (two) hours as needed for migraine. May repeat in 2 hours if headache persists or recurs. 10 tablet 0   Syringe/Needle, Disp, (SYRINGE 3CC/25GX1-1/2") 25G X 1-1/2" 3 ML MISC Used to inject Vit B12 15 each 1   traMADol (ULTRAM) 50 MG tablet Take 1 tablet (50 mg total) by mouth at bedtime as needed. 30 tablet 3   No current facility-administered medications on file prior to visit.    Review of Systems:  General Not Present- Appetite Loss, Chills, Fatigue, Fever, Night Sweats, Weight Gain  and Weight Loss. Skin Not Present- Change in Wart/Mole, Dryness, Hives, Jaundice, New Lesions, Non-Healing Wounds, Rash and Ulcer. HEENT Present- Seasonal Allergies. Not Present- Earache, Hearing Loss, Hoarseness, Nose Bleed, Oral Ulcers, Ringing in the Ears, Sinus Pain, Sore Throat, Visual Disturbances, Wears glasses/contact lenses and Yellow Eyes. Respiratory Not Present- Bloody sputum, Chronic Cough, Difficulty Breathing, Snoring and Wheezing. Breast Not Present- Breast Mass, Breast Pain, Nipple Discharge and Skin Changes. Cardiovascular Not Present- Chest Pain, Difficulty Breathing Lying Down, Leg Cramps, Palpitations, Rapid Heart Rate, Shortness of  Breath and Swelling of Extremities. Gastrointestinal Present- Abdominal Pain, Hemorrhoids and Indigestion. Not Present- Bloating, Bloody Stool, Change in Bowel Habits, Chronic diarrhea, Constipation, Difficulty Swallowing, Excessive gas, Gets full quickly at meals, Nausea, Rectal Pain and Vomiting. Female Genitourinary Present- Urgency. Not Present- Frequency, Nocturia, Painful Urination and Pelvic Pain. Musculoskeletal Present- Back Pain. Not Present- Joint Pain, Joint Stiffness, Muscle Pain, Muscle Weakness and Swelling of Extremities. Neurological Not Present- Decreased Memory, Fainting, Headaches, Numbness, Seizures, Tingling, Tremor, Trouble walking and Weakness. Psychiatric Present- Anxiety. Not Present- Bipolar, Change in Sleep Pattern, Depression, Fearful and Frequent crying. Endocrine Present- Cold Intolerance. Not Present- Excessive Hunger, Hair Changes, Heat Intolerance, Hot flashes and New Diabetes. Hematology Not Present- Blood Thinners, Easy Bruising, Excessive bleeding, Gland problems, HIV and Persistent Infections.  Physical Exam: Vitals  Weight: 288.13 lb   Height: 65 in  Body Surface Area: 2.31 m   Body Mass Index: 47.95 kg/m   Temp.: 97.3 F    Pulse: 53 (Regular)    BP: 128/82(Sitting, Left Arm, Standard)  Alert and  cooperative Unlabored respirations Abdomen is soft, nondistended. Midline laparotomy and right subcostal incisions, as well as supraumbilical and infraumbilical and bilateral laproscopic port-site scars. Vaguely palpable mass in the periumbilical region which is tender, nonreducible. CT shows incisional hernia here containing fat.   CBC Latest Ref Rng & Units 08/20/2019 11/02/2018 10/31/2017  WBC 3.8 - 10.8 Thousand/uL 6.5 5.8 6.6  Hemoglobin 11.7 - 15.5 g/dL 9.3(A) 3.5(T) 10.1(L)  Hematocrit 35 - 45 % 35.3 32.4(L) 31.5(L)  Platelets 140 - 400 Thousand/uL 280 280.0 242.0    CMP Latest Ref Rng & Units 08/20/2019 11/02/2018 10/31/2017  Glucose 65 - 99 mg/dL 87 85 80  BUN 7 - 25 mg/dL 10 10 12   Creatinine 0.50 - 1.10 mg/dL 7.32 2.02  Sodium 135 - 146 mmol/L 139 140 141  Potassium 3.5 - 5.3 mmol/L 4.2 4.1 3.8  Chloride 98 - 110 mmol/L 108 107 108  CO2 20 - 32 mmol/L 26 28 29   Calcium 8.6 - 10.2 mg/dL 8.8 8.7 8.4  Total Protein 6.1 - 8.1 g/dL 5.9(L) 6.0 5.8(L)  Total Bilirubin 0.2 - 1.2 mg/dL 0.4 0.5 0.3  Alkaline Phos 39 - 117 U/L - 74 89  AST 10 - 35 U/L 18 17 17   ALT 6 - 29 U/L 14 11 10     No results found for: INR, PROTIME  Imaging: No results found.   A/P: INCISIONAL HERNIA, INCARCERATED (K43.0) Story: This is her biggest concern. The pain is accelerating and inhibiting her quality of life. Discussed laparoscopic assisted repair, likely will require some adhesiolysis, and discussed use of mesh. Risks including bleeding, infection, pain (both acute and chronic), scarring, injury to viscera, hernia recurrence (higher risk due to obesity status), and adhesion formation; as well as cardiovascular/pulmonary/thromboembolic complications. Discussed that hernia may not change or it may become larger or more symptomatic without repair, minimal risk of bowel incarceration. Questions welcomed and answered. Discussed that I do not do panniculectomy but would refer her to plastic surgery if  desired, and theoretically a combined case could be considered. She will consider this and for now will plan to proceed with incisional hernia repair alone. HIATAL HERNIA (K44.9) Story: She does have GERD from this. Discussed this would require surgery to bring the pouch back down into the abdomen and narrow the hiatus. Discussed risks of bleeding, infection, injury to viscera and high risk of hernia recurrence given obesity status. I discussed it would be best to address  both problems in one operation if possible, but she is very hesitant about having surgery for this and her reflux is apparently a minor issue for her at this time. [facility] HISTORY OF ROUX-EN-Y GASTRIC BYPASS (Z98.84) Story: open HISTORY OF CHOLECYSTECTOMY (Z90.49) OBESITY, MORBID, BMI 40.0-49.9 (E66.01) Story: s/p RYGB, centripetal component is small    Patient Active Problem List   Diagnosis Date Noted   Epigastric pain 08/21/2019   Finger pain, left 08/03/2019   Eustachian tube dysfunction 04/10/2019   Degenerative arthritis of right knee 07/05/2016   Excessive and redundant skin and subcutaneous tissue 03/30/2016   Chronic sinusitis 01/05/2016   Routine general medical examination at a health care facility 09/30/2015   Adjustment disorder with mixed anxiety and depressed mood 07/04/2015   B12 deficiency 09/25/2014   Chondromalacia patellae of left knee 05/28/2014   GERD (gastroesophageal reflux disease) 03/05/2014   Morbid obesity (HCC) 03/05/2014   Perennial and seasonal allergic rhinitis 03/05/2014   Left knee pain 03/05/2014   Essential hypertension 03/05/2014   Mild persistent asthma 03/05/2014   Hx of gastric bypass 03/05/2014       Phylliss Blakeshelsea Maritza Hosterman, MD [facility]  See AMION to contact appropriate on-call provider

## 2019-11-04 ENCOUNTER — Other Ambulatory Visit: Payer: Self-pay | Admitting: Allergy and Immunology

## 2019-11-04 ENCOUNTER — Telehealth: Payer: Self-pay | Admitting: Internal Medicine

## 2019-11-07 MED ORDER — OMEPRAZOLE 40 MG PO CPDR: 40.0000 mg | DELAYED_RELEASE_CAPSULE | Freq: Every day | ORAL | 0 refills | Status: DC

## 2019-11-07 NOTE — Addendum Note (Signed)
Addended by: Darryll Capers on: 11/07/2019 04:39 PM   Modules accepted: Orders

## 2019-11-07 NOTE — Telephone Encounter (Signed)
   Patient calling upset because refill was denied. Appointment scheduled 11/15, patient declined sooner appointment with another provider in the office. Patient wants to know if a short supply can be ordered

## 2019-12-10 ENCOUNTER — Other Ambulatory Visit: Payer: Self-pay

## 2019-12-10 ENCOUNTER — Encounter: Payer: Self-pay | Admitting: Internal Medicine

## 2019-12-10 ENCOUNTER — Ambulatory Visit (INDEPENDENT_AMBULATORY_CARE_PROVIDER_SITE_OTHER): Payer: BC Managed Care – PPO | Admitting: Internal Medicine

## 2019-12-10 VITALS — BP 124/80 | HR 60 | Temp 98.1°F | Ht 64.0 in | Wt 283.0 lb

## 2019-12-10 DIAGNOSIS — G8929 Other chronic pain: Secondary | ICD-10-CM

## 2019-12-10 DIAGNOSIS — Z23 Encounter for immunization: Secondary | ICD-10-CM

## 2019-12-10 DIAGNOSIS — R1013 Epigastric pain: Secondary | ICD-10-CM

## 2019-12-10 DIAGNOSIS — M25562 Pain in left knee: Secondary | ICD-10-CM

## 2019-12-10 DIAGNOSIS — F4323 Adjustment disorder with mixed anxiety and depressed mood: Secondary | ICD-10-CM | POA: Diagnosis not present

## 2019-12-10 NOTE — Progress Notes (Signed)
   Subjective:   Patient ID: [name], female    DOB: 1970-12-10, 49 y.o.   MRN: [id number]  HPI The patient is a 49 YO female coming in for chronic issues with stomach. She has had flares over the years. She has hernia on the stomach midline which is not painful. She is hoping to get surgery for reduction of tissue, as well as hernia repair in the near future. Visit with surgeon in December. She denies change in bowels such as diarrhea or constipation. Her arthritis is some worse recently but she is managing with otc medications. Breathing is doing well without flare up recently.   Review of Systems  Constitutional: Negative.   HENT: Negative.   Eyes: Negative.   Respiratory: Negative for cough, chest tightness and shortness of breath.   Cardiovascular: Negative for chest pain, palpitations and leg swelling.  Gastrointestinal: Negative for abdominal distention, abdominal pain, constipation, diarrhea, nausea and vomiting.  Musculoskeletal: Negative.   Skin: Negative.   Neurological: Negative.   Psychiatric/Behavioral: Negative.     Objective:  Physical Exam Constitutional:      Appearance: She is well-developed. She is obese.  HENT:     Head: Normocephalic and atraumatic.  Cardiovascular:     Rate and Rhythm: Normal rate and regular rhythm.  Pulmonary:     Effort: Pulmonary effort is normal. No respiratory distress.     Breath sounds: Normal breath sounds. No wheezing or rales.  Abdominal:     General: Bowel sounds are normal. There is no distension.     Palpations: Abdomen is soft.     Tenderness: There is no abdominal tenderness. There is no rebound.  Musculoskeletal:        General: Tenderness present.     Cervical back: Normal range of motion.  Skin:    General: Skin is warm and dry.  Neurological:     Mental Status: She is alert and oriented to person, place, and time.     Coordination: Coordination normal.     Comments: Slow sit to stand time     Vitals:    12/10/19 1520  BP: 124/80  Pulse: 60  Temp: 98.1 F (36.7 C)  TempSrc: Oral  SpO2: 98%  Weight: 283 lb (128.4 kg)  Height: 5\' 4"  (1.626 m)    This visit occurred during the SARS-CoV-2 public health emergency.  Safety protocols were in place, including screening questions prior to the visit, additional usage of staff PPE, and extensive cleaning of exam room while observing appropriate contact time as indicated for disinfecting solutions.   Assessment & Plan:  Flu shot given at visit

## 2019-12-11 ENCOUNTER — Encounter: Payer: Self-pay | Admitting: Internal Medicine

## 2019-12-13 NOTE — Assessment & Plan Note (Signed)
Manages with otc medications. Is careful to avoid falls.

## 2019-12-13 NOTE — Assessment & Plan Note (Signed)
Using xanax rarely.

## 2019-12-13 NOTE — Assessment & Plan Note (Signed)
Weight stable and we discussed ongoing need to work on weight loss with diet and exercise. Does not want medications at this time.

## 2019-12-13 NOTE — Assessment & Plan Note (Signed)
Has levsin and this helps some. She is pursing surgical resolution of her hernia and skin issues to help.

## 2020-01-03 ENCOUNTER — Encounter: Payer: Self-pay | Admitting: Internal Medicine

## 2020-01-03 MED ORDER — AMOXICILLIN-POT CLAVULANATE 875-125 MG PO TABS: 1.0000 | ORAL_TABLET | Freq: Two times a day (BID) | ORAL | 0 refills | Status: DC

## 2020-01-03 NOTE — Addendum Note (Signed)
Addended by: Hillard Danker A on: 01/03/2020 04:39 PM   Modules accepted: Orders

## 2020-01-24 ENCOUNTER — Other Ambulatory Visit: Payer: Self-pay

## 2020-01-24 ENCOUNTER — Encounter: Payer: Self-pay | Admitting: Plastic Surgery

## 2020-01-24 ENCOUNTER — Ambulatory Visit: Payer: BC Managed Care – PPO | Admitting: Plastic Surgery

## 2020-01-24 VITALS — BP 109/79 | HR 57 | Temp 97.8°F | Ht 65.0 in | Wt 283.0 lb

## 2020-01-24 DIAGNOSIS — M793 Panniculitis, unspecified: Secondary | ICD-10-CM

## 2020-01-24 NOTE — Progress Notes (Signed)
Referring Provider Myrlene Broker, MD [address] [place],  [place] [address]   CC:  Chief Complaint  Patient presents with  . Advice Only      [name] is an 49 y.o. female.  HPI: Patient presents to discuss panniculectomy.  She has had a large overhanging skin after gastric bypass surgery.  Her weight has been stable for at least 6 months.  She is bothered by the skin irritation beneath the skin fold and is tried over-the-counter and prescription treatments with no relief.  She has been planning an upcoming laparoscopic hernia repair with Dr. Eustaquio Boyden and wants to discuss a panniculectomy done at the same time.  Other surgeries include a cholecystectomy and laparoscopic tubal ligation.  No Known Allergies  Outpatient Encounter Medications as of 01/24/2020  Medication Sig  . albuterol (PROAIR HFA) 108 (90 Base) MCG/ACT inhaler Inhale 2 puffs into the lungs every 6 (six) hours as needed for wheezing or shortness of breath.  . ALPRAZolam (XANAX) 0.25 MG tablet TAKE 1 TABLET BY MOUTH ONCE DAILY AS NEEDED FOR ANXIETY  . aluminum hydroxide-magnesium carbonate (GAVISCON) 95-358 MG/15ML SUSP Take 15 mLs by mouth 3 (three) times daily after meals.  Marland Kitchen amoxicillin-clavulanate (AUGMENTIN) 875-125 MG tablet Take 1 tablet by mouth 2 (two) times daily.  . Azelastine-Fluticasone 137-50 MCG/ACT SUSP Place 1 spray into the nose 2 (two) times daily as needed.  . cetirizine (ZYRTEC) 10 MG tablet Take 10 mg by mouth daily.  . clotrimazole-betamethasone (LOTRISONE) cream Apply 1 application topically 2 (two) times daily.  . fluticasone (FLOVENT HFA) 110 MCG/ACT inhaler Inhale 2 puffs into the lungs 2 (two) times daily.  . Fluticasone Propionate (XHANCE) 93 MCG/ACT EXHU 2 sprays per nostril twice daily as needed for stuffy nose.  . furosemide (LASIX) 20 MG tablet Take 1 tablet (20 mg total) by mouth daily.  . hydrocortisone (ANUSOL-HC) 2.5 % rectal cream Place 1 application rectally 2  (two) times daily.  . hyoscyamine (LEVSIN SL) 0.125 MG SL tablet DISSOLVE 1 TABLET UNDER THE TONGUE EVERY 4 HOURS AS NEEDED  . metoprolol tartrate (LOPRESSOR) 50 MG tablet Take 0.5 tablets (25 mg total) by mouth 2 (two) times daily. Annual appt due in Oct must see provider for future refills  . montelukast (SINGULAIR) 10 MG tablet TAKE 1 TABLET BY MOUTH AT BEDTIME  . omeprazole (PRILOSEC) 40 MG capsule Take 1 capsule (40 mg total) by mouth daily.  . sodium chloride (OCEAN) 0.65 % SOLN nasal spray Place 1 spray into both nostrils as needed for congestion.  . SUMAtriptan (IMITREX) 50 MG tablet Take 1 tablet (50 mg total) by mouth every 2 (two) hours as needed for migraine. May repeat in 2 hours if headache persists or recurs.  . Syringe/Needle, Disp, (SYRINGE 3CC/25GX1-1/2") 25G X 1-1/2" 3 ML MISC Used to inject Vit B12  . traMADol (ULTRAM) 50 MG tablet Take 1 tablet (50 mg total) by mouth at bedtime as needed.   No facility-administered encounter medications on file as of 01/24/2020.     Past Medical History:  Diagnosis Date  . Anemia   . Asthma   . Bulging disc   . Hypertension   . Obesity   . PONV (postoperative nausea and vomiting)    PT IS [employer]    Past Surgical History:  Procedure Laterality Date  . CHOLECYSTECTOMY    . DILITATION & CURRETTAGE/HYSTROSCOPY WITH NOVASURE ABLATION N/A 09/20/2013   Procedure: DILATATION & CURETTAGE/HYSTEROSCOPY WITH NOVASURE ABLATION;  Surgeon:  Juluis Mire, MD;  Location: [facility];  Service: Gynecology;  Laterality: N/A;  . GASTRIC BYPASS    . LAPAROSCOPIC TUBAL LIGATION Bilateral 09/20/2013   Procedure: LAPAROSCOPIC TUBAL LIGATION;  Surgeon: Juluis Mire, MD;  Location: [facility];  Service: Gynecology;  Laterality: Bilateral;    Family History  Problem Relation Age of Onset  . Breast cancer Mother   . Hypertension Sister   . Asthma Brother   . Allergic rhinitis Neg Hx   . Eczema Neg Hx   . Immunodeficiency Neg Hx   . Urticaria Neg Hx    . Angioedema Neg Hx     Social History   Social History Narrative  . Not on file  Denies tobacco use  Review of Systems General: Denies fevers, chills, weight loss CV: Denies chest pain, shortness of breath, palpitations  Physical Exam Vitals with BMI 01/24/2020 12/10/2019 08/20/2019  Height 5\' 5"  5\' 4"  5\' 4"   Weight 283 lbs 283 lbs 298 lbs  BMI 47.09 48.55 51.13  Systolic 109 124  Diastolic 79 80 76  Pulse 57 60 57    General:  No acute distress,  Alert and oriented, Non-Toxic, Normal speech and affect Abdomen: Abdomen is soft nontender.  She has a large overhanging pannus and some midline scars.  She additionally has some overhanging the upper abdominal area.  There is signs of chronic skin irritation in the crease.  Assessment/Plan I think the patient is a reasonable candidate for panniculectomy in conjunction with hernia repair.  We will plan to coordinate with Dr. office.  I discussed the details of the procedure with the patient and explained that I would be able to offer her an infraumbilical panniculectomy.  The upper abdominal overhanging and contour would need to wait for a secondary procedure if she wished to pursue that.  At the moment her main focus is the infraumbilical pannus and she is content with addressing that specifically.  I discussed the risks of the procedure that include bleeding, infection, damage to surrounding structures and need for additional procedure.  We discussed the potential for wound healing complications and the fact that her BMI predisposes her for those.  I do think it is reasonably safe to proceed with the surgery particularly if she is undergoing anesthesia for the hernia repair anyhow.  I explained the general postoperative expectations and the need for drains postoperatively.  All of her questions were answered and we will plan to move forward.  01/24/2020, 9:40 AM

## 2020-02-25 ENCOUNTER — Telehealth: Payer: Self-pay | Admitting: Internal Medicine

## 2020-02-25 NOTE — Telephone Encounter (Signed)
I received a renewal parking placard.   Form has been completed &Placed in providers box to review and sign if approved.  LOV : 12/10/2019

## 2020-02-25 NOTE — Telephone Encounter (Signed)
Forms has been signed, Copy sent to scan.   LVM to inform patient it is ready to be picked up. If she would like it mailed to call back and let me know.

## 2020-04-07 ENCOUNTER — Encounter: Payer: Self-pay | Admitting: Internal Medicine

## 2020-04-07 MED ORDER — MUPIROCIN CALCIUM 2 % EX CREA: 1.0000 "application " | TOPICAL_CREAM | Freq: Two times a day (BID) | CUTANEOUS | 0 refills | Status: AC

## 2020-04-08 MED ORDER — MUPIROCIN 2 % EX OINT: 1.0000 "application " | TOPICAL_OINTMENT | Freq: Every day | CUTANEOUS | 0 refills | Status: AC

## 2020-04-08 NOTE — Telephone Encounter (Signed)
[facility] calling, asking if we can get the  mupirocin cream (BACTROBAN) 2 % changed from a cream to an ointment because the ointment is covered by her insurance [facility]- [phone]

## 2020-04-08 NOTE — Addendum Note (Signed)
Addended by: Hillard Danker A on: 04/08/2020 11:18 AM   Modules accepted: Orders

## 2020-04-11 ENCOUNTER — Other Ambulatory Visit: Payer: Self-pay | Admitting: Otolaryngology

## 2020-04-11 DIAGNOSIS — J329 Chronic sinusitis, unspecified: Secondary | ICD-10-CM

## 2020-05-07 ENCOUNTER — Telehealth: Payer: Self-pay | Admitting: Plastic Surgery

## 2020-05-07 NOTE — Telephone Encounter (Signed)
LVM for patient requesting call back. If patient calls back, please advise that we referred her to Dr. Jeanene Erb. [facility] Lerner for panniculectomy. Office does not use insurance for panniculectomy and if she wants to proceed cosmetically, she should call the office to schedule. [phone]

## 2020-05-13 ENCOUNTER — Encounter: Payer: Self-pay | Admitting: Internal Medicine

## 2020-05-13 MED ORDER — PREDNISONE 20 MG PO TABS: 40.0000 mg | ORAL_TABLET | Freq: Every day | ORAL | 0 refills | Status: DC

## 2020-06-03 ENCOUNTER — Other Ambulatory Visit: Payer: Self-pay | Admitting: Internal Medicine

## 2020-06-27 ENCOUNTER — Encounter: Payer: Self-pay | Admitting: Internal Medicine

## 2020-07-23 NOTE — Progress Notes (Signed)
Virtual Visit via Video Note  I connected with [name] on 07/23/20 at  7:50 AM EDT by a video enabled telemedicine application and verified that I am speaking with the correct person using two identifiers.   I discussed the limitations of evaluation and management by telemedicine and the availability of in person appointments. The patient expressed understanding and agreed to proceed.  Present for the visit:  Myself, Dr Cheryll Cockayne, [name].  The patient is currently at home and I am in the office.    No referring provider.    History of Present Illness: She is here for an acute visit for covid  Tested positive for covid by PCR test two days ago.  Her symptoms started two days ago.    She has asthma, htn and is obese.  She has had covid in 2020 and has not had any vaccines.     She had a headache, fever, dec appetite, fatigue, aches, nausea, diarrhea, dizziness, congestion with thick clear mucus, sinus pain, cough from PND, some chest tightness.  She is taking mucinex, flovent, vitamin d, zinc and airborne.      Review of Systems  Constitutional:  Positive for fever and malaise/fatigue.       Dec appetite  HENT:  Positive for congestion (clear but thick), sinus pain and sore throat. Negative for ear pain (stabbing in ears).   Respiratory:  Positive for cough. Negative for shortness of breath and wheezing.        Some chest tightness, airway feels inflammed  Gastrointestinal:  Positive for diarrhea and nausea.  Musculoskeletal:  Positive for myalgias.  Neurological:  Positive for dizziness and headaches.     Social History   Socioeconomic History   Marital status: Divorced    Spouse name: Not on file   Number of children: Not on file   Years of education: Not on file   Highest education level: Not on file  Occupational History   Not on file  Tobacco Use   Smoking status: Never   Smokeless tobacco: Never  Substance and Sexual Activity   Alcohol use:  No   Drug use: No   Sexual activity: Not on file  Other Topics Concern   Not on file  Social History Narrative   Not on file   Social Determinants of Health   Financial Resource Strain: Not on file  Food Insecurity: Not on file  Transportation Needs: Not on file  Physical Activity: Not on file  Stress: Not on file  Social Connections: Not on file     Observations/Objective: Appears mildly ill but in NAD Breathing normally Skin appears warm and dry  Assessment and Plan:  See Problem List for Assessment and Plan of chronic medical problems.   Follow Up Instructions:    I discussed the assessment and treatment plan with the patient. The patient was provided an opportunity to ask questions and all were answered. The patient agreed with the plan and demonstrated an understanding of the instructions.   The patient was advised to call back or seek an in-person evaluation if the symptoms worsen or if the condition fails to improve as anticipated.    Pincus Sanes, MD

## 2020-07-24 ENCOUNTER — Encounter: Payer: Self-pay | Admitting: Internal Medicine

## 2020-07-24 ENCOUNTER — Telehealth: Payer: BC Managed Care – PPO | Admitting: Internal Medicine

## 2020-07-24 DIAGNOSIS — U071 COVID-19: Secondary | ICD-10-CM | POA: Diagnosis not present

## 2020-07-24 MED ORDER — ONDANSETRON 4 MG PO TBDP: 4.0000 mg | ORAL_TABLET | Freq: Three times a day (TID) | ORAL | 0 refills | Status: DC | PRN

## 2020-07-24 MED ORDER — MOLNUPIRAVIR 200 MG PO CAPS: 800.0000 mg | ORAL_CAPSULE | Freq: Two times a day (BID) | ORAL | 0 refills | Status: AC

## 2020-07-24 NOTE — Assessment & Plan Note (Signed)
Acute Symptoms started 2 days ago, pos PCR test from 2 days ago Symptoms mild-moderate in nature Has not had any vaccines and has asthma so I would consider her to be at an increased risk of complications Will prescribe molnupiravir - discussed this is not approved for treatment of covid but being used on an emergency basis. Discussed possible side effects Will rx zofran for nausea Continue symptomatic treatment, inhalers Unable to return to work until she has a neg covid test Will call with questions or concerns

## 2020-07-29 ENCOUNTER — Encounter: Payer: Self-pay | Admitting: Internal Medicine

## 2020-07-30 ENCOUNTER — Encounter: Payer: Self-pay | Admitting: Internal Medicine

## 2020-08-21 ENCOUNTER — Other Ambulatory Visit: Payer: Self-pay | Admitting: Internal Medicine

## 2020-09-02 ENCOUNTER — Other Ambulatory Visit: Payer: Self-pay | Admitting: *Deleted

## 2020-10-30 ENCOUNTER — Ambulatory Visit (INDEPENDENT_AMBULATORY_CARE_PROVIDER_SITE_OTHER): Payer: BC Managed Care – PPO | Admitting: Internal Medicine

## 2020-10-30 ENCOUNTER — Encounter: Payer: Self-pay | Admitting: Internal Medicine

## 2020-10-30 ENCOUNTER — Other Ambulatory Visit: Payer: Self-pay

## 2020-10-30 VITALS — BP 126/84 | HR 51 | Temp 98.5°F | Resp 18 | Ht 65.0 in | Wt 265.8 lb

## 2020-10-30 DIAGNOSIS — Z0000 Encounter for general adult medical examination without abnormal findings: Secondary | ICD-10-CM

## 2020-10-30 DIAGNOSIS — R5383 Other fatigue: Secondary | ICD-10-CM

## 2020-10-30 DIAGNOSIS — Z23 Encounter for immunization: Secondary | ICD-10-CM

## 2020-10-30 DIAGNOSIS — J320 Chronic maxillary sinusitis: Secondary | ICD-10-CM

## 2020-10-30 DIAGNOSIS — J4530 Mild persistent asthma, uncomplicated: Secondary | ICD-10-CM

## 2020-10-30 DIAGNOSIS — E538 Deficiency of other specified B group vitamins: Secondary | ICD-10-CM

## 2020-10-30 DIAGNOSIS — I10 Essential (primary) hypertension: Secondary | ICD-10-CM

## 2020-10-30 MED ORDER — HYDROCORTISONE ACETATE 25 MG RE SUPP: 25.0000 mg | Freq: Two times a day (BID) | RECTAL | 11 refills | Status: AC

## 2020-10-30 NOTE — Assessment & Plan Note (Signed)
Getting ready to have surgery on redundant tissue in near future and taking saxenda to help with weight loss.

## 2020-10-30 NOTE — Assessment & Plan Note (Signed)
Needs oral B12 lifelong due to gastric bypass surgery.

## 2020-10-30 NOTE — Assessment & Plan Note (Addendum)
No flare today and uses albuterol prn and flovent BID which we will continue.

## 2020-10-30 NOTE — Progress Notes (Signed)
   Subjective:   Patient ID: [name], female    DOB: Jun 19, 1970, 49 y.o.   MRN: [id number]  HPI The patient is a 50 YO female coming in for physical.   PMH, FMH, social history reviewed and updated  Review of Systems  Constitutional: Negative.   HENT: Negative.    Eyes: Negative.   Respiratory:  Negative for cough, chest tightness and shortness of breath.   Cardiovascular:  Negative for chest pain, palpitations and leg swelling.  Gastrointestinal:  Negative for abdominal distention, abdominal pain, constipation, diarrhea, nausea and vomiting.  Musculoskeletal:  Positive for arthralgias.  Skin: Negative.   Neurological: Negative.   Psychiatric/Behavioral: Negative.     Objective:  Physical Exam Constitutional:      Appearance: She is well-developed. She is obese.  HENT:     Head: Normocephalic and atraumatic.  Cardiovascular:     Rate and Rhythm: Normal rate and regular rhythm.  Pulmonary:     Effort: Pulmonary effort is normal. No respiratory distress.     Breath sounds: Normal breath sounds. No wheezing or rales.  Abdominal:     General: Bowel sounds are normal. There is no distension.     Palpations: Abdomen is soft.     Tenderness: There is no abdominal tenderness. There is no rebound.  Musculoskeletal:     Cervical back: Normal range of motion.  Skin:    General: Skin is warm and dry.  Neurological:     Mental Status: She is alert and oriented to person, place, and time.     Coordination: Coordination normal.    Vitals:   10/30/20 0946  BP: 126/84  Pulse: (!) 51  Resp: 18  Temp: 98.5 F (36.9 C)  TempSrc: Oral  SpO2: 99%  Weight: 265 lb 12.8 oz (120.6 kg)  Height: 5\' 5"  (1.651 m)    This visit occurred during the SARS-CoV-2 public health emergency.  Safety protocols were in place, including screening questions prior to the visit, additional usage of staff PPE, and extensive cleaning of exam room while observing appropriate contact time as  indicated for disinfecting solutions.   Assessment & Plan:  Flu shot given at visit

## 2020-10-30 NOTE — Assessment & Plan Note (Signed)
Flu shot given. Tetanus counseled. Colonoscopy patient states done Jan 2022 cannot recall provider name. Mammogram up to date, pap smear up to date. Counseled about sun safety and mole surveillance. Counseled about the dangers of distracted driving. Given 10 year screening recommendations.

## 2020-10-30 NOTE — Assessment & Plan Note (Signed)
BP at goal today on metoprolol 25 mg BID and lasix 20 mg daily. Recent labs through work without indication for change so will continue.

## 2020-10-30 NOTE — Assessment & Plan Note (Signed)
Checking iron levels as patient reports abnormal ferritin on screening labs through work.

## 2020-10-30 NOTE — Assessment & Plan Note (Signed)
Much improved since being on singulair long term.

## 2020-10-31 LAB — IRON, TOTAL/TOTAL IRON BINDING CAP
%SAT: 7 % (calc) — ABNORMAL LOW (ref 16–45)
Iron: 31 ug/dL — ABNORMAL LOW (ref 40–190)
TIBC: 441 mcg/dL (calc) (ref 250–450)

## 2020-11-17 ENCOUNTER — Encounter: Payer: Self-pay | Admitting: Internal Medicine

## 2020-11-17 MED ORDER — HYDROCORTISONE (PERIANAL) 2.5 % EX CREA: 1.0000 "application " | TOPICAL_CREAM | Freq: Two times a day (BID) | CUTANEOUS | 0 refills | Status: AC

## 2020-11-21 ENCOUNTER — Other Ambulatory Visit: Payer: Self-pay | Admitting: Internal Medicine

## 2020-11-25 ENCOUNTER — Encounter: Payer: Self-pay | Admitting: Internal Medicine

## 2020-11-27 MED ORDER — MONTELUKAST SODIUM 10 MG PO TABS
10.0000 mg | ORAL_TABLET | Freq: Every day | ORAL | 1 refills | Status: DC
Start: 2020-11-27 — End: 2021-07-08

## 2020-12-24 ENCOUNTER — Encounter: Payer: Self-pay | Admitting: Internal Medicine

## 2020-12-25 MED ORDER — TRAMADOL HCL 50 MG PO TABS
50.0000 mg | ORAL_TABLET | Freq: Every evening | ORAL | 0 refills | Status: AC | PRN
Start: 2020-12-25 — End: ?

## 2020-12-25 MED ORDER — FLUCONAZOLE 150 MG PO TABS: 150.0000 mg | ORAL_TABLET | Freq: Once | ORAL | 0 refills | Status: AC

## 2020-12-25 MED ORDER — ALPRAZOLAM 0.25 MG PO TABS: ORAL_TABLET | ORAL | 0 refills | Status: AC

## 2021-01-25 ENCOUNTER — Emergency Department (HOSPITAL_BASED_OUTPATIENT_CLINIC_OR_DEPARTMENT_OTHER): Payer: BC Managed Care – PPO

## 2021-01-25 ENCOUNTER — Emergency Department (HOSPITAL_BASED_OUTPATIENT_CLINIC_OR_DEPARTMENT_OTHER)
Admission: EM | Admit: 2021-01-25 | Discharge: 2021-01-25 | Disposition: A | Discharge status: Home / Self Care | Payer: BC Managed Care – PPO | Attending: Emergency Medicine | Admitting: Emergency Medicine

## 2021-01-25 ENCOUNTER — Encounter (HOSPITAL_BASED_OUTPATIENT_CLINIC_OR_DEPARTMENT_OTHER): Payer: Self-pay | Admitting: Urology

## 2021-01-25 DIAGNOSIS — R002 Palpitations: Secondary | ICD-10-CM | POA: Diagnosis not present

## 2021-01-25 DIAGNOSIS — Z79899 Other long term (current) drug therapy: Secondary | ICD-10-CM | POA: Diagnosis not present

## 2021-01-25 LAB — COMPREHENSIVE METABOLIC PANEL
ALT: 14 U/L (ref 0–44)
AST: 20 U/L (ref 15–41)
Albumin: 3 g/dL — ABNORMAL LOW (ref 3.5–5.0)
Alkaline Phosphatase: 67 U/L (ref 38–126)
Anion gap: 7 (ref 5–15)
BUN: 10 mg/dL (ref 6–20)
CO2: 21 mmol/L — ABNORMAL LOW (ref 22–32)
Calcium: 7.8 mg/dL — ABNORMAL LOW (ref 8.9–10.3)
Chloride: 108 mmol/L (ref 98–111)
Creatinine, Ser: 0.47 mg/dL (ref 0.44–1.00)
GFR, Estimated: >60 mL/min (ref >60)
Glucose, Bld: 83 mg/dL (ref 70–99)
Potassium: 3.3 mmol/L — ABNORMAL LOW (ref 3.5–5.1)
Sodium: 136 mmol/L (ref 135–145)
Total Bilirubin: 0.4 mg/dL (ref 0.3–1.2)
Total Protein: 5.7 g/dL — ABNORMAL LOW (ref 6.5–8.1)

## 2021-01-25 LAB — CBC WITH DIFFERENTIAL/PLATELET
Abs Immature Granulocytes: 0.01 10*3/uL (ref 0.00–0.07)
Basophils Absolute: 0.1 10*3/uL (ref 0.0–0.1)
Basophils Relative: 1 %
Eosinophils Absolute: 0 10*3/uL (ref 0.0–0.5)
Eosinophils Relative: 1 %
HCT: 32.7 % — ABNORMAL LOW (ref 36.0–46.0)
Hemoglobin: 9.8 g/dL — ABNORMAL LOW (ref 12.0–15.0)
Immature Granulocytes: 0 %
Lymphocytes Relative: 30 %
Lymphs Abs: 1.6 10*3/uL (ref 0.7–4.0)
MCH: 23.5 pg — ABNORMAL LOW (ref 26.0–34.0)
MCHC: 30 g/dL (ref 30.0–36.0)
MCV: 78.4 fL — ABNORMAL LOW (ref 80.0–100.0)
Monocytes Absolute: 0.6 10*3/uL (ref 0.1–1.0)
Monocytes Relative: 10 %
Neutro Abs: 3.3 10*3/uL (ref 1.7–7.7)
Neutrophils Relative %: 58 %
Platelets: 302 10*3/uL (ref 150–400)
RBC: 4.17 MIL/uL (ref 3.87–5.11)
RDW: 16.3 % — ABNORMAL HIGH (ref 11.5–15.5)
WBC: 5.5 10*3/uL (ref 4.0–10.5)
nRBC: 0 % (ref 0.0–0.2)

## 2021-01-25 LAB — PREGNANCY, URINE: Preg Test, Ur: NEGATIVE

## 2021-01-25 LAB — TROPONIN I (HIGH SENSITIVITY): Troponin I (High Sensitivity): 5 ng/L (ref <18)

## 2021-01-25 LAB — URINALYSIS, ROUTINE W REFLEX MICROSCOPIC
Bilirubin Urine: NEGATIVE
Glucose, UA: NEGATIVE mg/dL
Hgb urine dipstick: NEGATIVE
Ketones, ur: NEGATIVE mg/dL
Leukocytes,Ua: NEGATIVE
Nitrite: NEGATIVE
Protein, ur: NEGATIVE mg/dL
Specific Gravity, Urine: 1.02 (ref 1.005–1.030)
pH: 7 (ref 5.0–8.0)

## 2021-01-25 LAB — MAGNESIUM: Magnesium: 1.7 mg/dL (ref 1.7–2.4)

## 2021-01-25 LAB — CBG MONITORING, ED: Glucose-Capillary: 70 mg/dL (ref 70–99)

## 2021-01-25 MED ORDER — MAGNESIUM OXIDE -MG SUPPLEMENT 400 (240 MG) MG PO TABS
800.0000 mg | ORAL_TABLET | Freq: Once | ORAL | Status: AC
  Administered 2021-01-25: 800 mg via ORAL
  Filled 2021-01-25: qty 2

## 2021-01-25 MED ORDER — SODIUM CHLORIDE 0.9 % IV SOLN
1.0000 g | Freq: Once | INTRAVENOUS | Status: DC
  Filled 2021-01-25: qty 10

## 2021-01-25 MED ORDER — LACTATED RINGERS IV BOLUS
1000.0000 mL | Freq: Once | INTRAVENOUS | Status: AC
  Administered 2021-01-25: 1000 mL via INTRAVENOUS

## 2021-01-25 MED ORDER — CALCIUM GLUCONATE-NACL 1-0.675 GM/50ML-% IV SOLN
INTRAVENOUS | Status: AC
  Administered 2021-01-25: 1000 mg
  Filled 2021-01-25: qty 50

## 2021-01-25 MED ORDER — POTASSIUM CHLORIDE CRYS ER 20 MEQ PO TBCR
40.0000 meq | EXTENDED_RELEASE_TABLET | Freq: Once | ORAL | Status: AC
Start: 2021-01-25 — End: 2021-01-25
  Administered 2021-01-25: 40 meq via ORAL
  Filled 2021-01-25: qty 2

## 2021-01-25 NOTE — ED Provider Notes (Signed)
[facility]ER[facility]ENC[facility] DEPARTMENT Provider Note   CSN: [id number] Arrival date & time: 01/25/21  1607     History  Chief Complaint  Patient presents with   Palpitations    [name] is a 51 y.o. female.   Palpitations Associated symptoms: dizziness   Associated symptoms: no back pain, no chest pain, no cough, no diaphoresis, no nausea, no numbness, no shortness of breath, no vomiting and no weakness   Patient with history of asthma, hypertension, gastric bypass (2004), and obesity, presents for palpitations.  Onset was last night at approximately 10:30 PM.  At the time, she was at church.  She felt a bounding heartbeat that was faster than it normally is.  She also felt dizziness at the time.  She has been on Saxenda for weight loss.  She is not a diabetic.  She felt that her sugar may have been low.  She did eat something and did not have any resolution of her symptoms.  She states that she would wake up throughout the night and continued to feel a bounding heartbeat.  This morning, her symptoms have improved but she was still feeling palpitations.  For this reason, she presented to the ED.  Currently, she denies any palpitations.  She has had similar symptoms in the past.  2 years ago, she did wear a cardiac monitor that was prescribed by her PCP.  Data from that monitor did not show any abnormal events.  She does not have any known cardiac history.  Her history is notable for asthma, hypertension, and obesity.  She did undergo a gastric bypass surgery in 2004.  She typically eats very little and states that she has been eating less over the past couple days.  Yesterday, she ate breakfast but did not eat for the rest of the day until she became symptomatic last night.  Patient denies any other recent symptoms.  She denies any alcohol or drug use.  She uses Xanax as needed.  She did take a dose last night when she was symptomatic, feeling that it may be attributable to her  anxiety.  Typically she takes Xanax about once a month.  She was recently at the site of a [employer] shooting and did utilize Xanax daily after that.  She does continue to take metoprolol.  She has Lasix prescribed as needed but has not used that anytime recently.    Home Medications Prior to Admission medications   Medication Sig Start Date End Date Taking? Authorizing Provider  albuterol (PROAIR HFA) 108 (90 Base) MCG/ACT inhaler Inhale 2 puffs into the lungs every 6 (six) hours as needed for wheezing or shortness of breath. 04/10/19   Bobbitt, Sedalia Muta, MD  ALPRAZolam Duanne Moron) 0.25 MG tablet TAKE 1 TABLET BY MOUTH ONCE DAILY AS NEEDED FOR ANXIETY 12/25/20   Hoyt Koch, MD  Azelastine-Fluticasone 137-50 MCG/ACT SUSP Place 1 spray into the nose 2 (two) times daily as needed. 04/10/19   Bobbitt, Sedalia Muta, MD  cetirizine (ZYRTEC) 10 MG tablet Take 10 mg by mouth daily.    [provider]  clotrimazole-betamethasone (LOTRISONE) cream Apply 1 application topically 2 (two) times daily. 07/20/19   Hoyt Koch, MD  fluticasone (FLOVENT HFA) 110 MCG/ACT inhaler Inhale 2 puffs into the lungs 2 (two) times daily. 07/24/18   Bobbitt, Sedalia Muta, MD  Fluticasone Propionate Truett Perna) 93 MCG/ACT EXHU 2 sprays per nostril twice daily as needed for stuffy nose. 08/13/19   Bobbitt, Sedalia Muta, MD  furosemide (LASIX) 20 MG tablet Take 1 tablet (20 mg total) by mouth daily. 08/30/16   Lyndal Pulley, DO  hydrocortisone (ANUSOL-HC) 2.5 % rectal cream Place 1 application rectally 2 (two) times daily. 11/17/20   Hoyt Koch, MD  hydrocortisone (ANUSOL-HC) 25 MG suppository Place 1 suppository (25 mg total) rectally 2 (two) times daily. 10/30/20   Hoyt Koch, MD  hyoscyamine (LEVSIN SL) 0.125 MG SL tablet DISSOLVE 1 TABLET UNDER THE TONGUE EVERY 4 HOURS AS NEEDED 02/15/19   Hoyt Koch, MD  metoprolol tartrate (LOPRESSOR) 50 MG tablet TAKE 1/2 (ONE-HALF) TABLET  BY MOUTH TWICE DAILY **ANNUAL  APPOINTMENT  DUE  IN  OCTOBER  MUST  SEE  PROVIDER  FOR  FUTURE  REFILLS** 11/26/20   Hoyt Koch, MD  montelukast (SINGULAIR) 10 MG tablet Take 1 tablet (10 mg total) by mouth at bedtime. 11/27/20   Hoyt Koch, MD  mupirocin cream (BACTROBAN) 2 % Apply 1 application topically 2 (two) times daily. 04/07/20   Hoyt Koch, MD  mupirocin ointment (BACTROBAN) 2 % Apply 1 application topically daily. 04/08/20   Hoyt Koch, MD  omeprazole (PRILOSEC) 40 MG capsule Take 1 capsule by mouth once daily 11/26/20   Hoyt Koch, MD  SAXENDA 18 MG/3ML SOPN Inject into the skin. 10/02/20   [provider]  sodium chloride (OCEAN) 0.65 % SOLN nasal spray Place 1 spray into both nostrils as needed for congestion. 03/25/15   Horton, Barbette Hair, MD  SUMAtriptan (IMITREX) 50 MG tablet Take 1 tablet (50 mg total) by mouth every 2 (two) hours as needed for migraine. May repeat in 2 hours if headache persists or recurs. 06/01/17   Hoyt Koch, MD  Syringe/Needle, Disp, (SYRINGE 3CC/25GX1-1/2") 25G X 1-1/2" 3 ML MISC Used to inject Vit B12 03/13/18   Hoyt Koch, MD  traMADol (ULTRAM) 50 MG tablet Take 1 tablet (50 mg total) by mouth at bedtime as needed. 12/25/20   Hoyt Koch, MD      Allergies    Patient has no known allergies.    Review of Systems   Review of Systems  Constitutional:  Negative for activity change, appetite change, chills, diaphoresis, fatigue and fever.  HENT:  Negative for congestion, ear pain and sore throat.   Eyes:  Negative for pain and visual disturbance.  Respiratory:  Negative for cough, chest tightness, shortness of breath and wheezing.   Cardiovascular:  Positive for palpitations. Negative for chest pain and leg swelling.  Gastrointestinal:  Negative for abdominal pain, diarrhea, nausea and vomiting.  Genitourinary:  Negative for dysuria, flank pain, hematuria and pelvic pain.   Musculoskeletal:  Negative for arthralgias, back pain, gait problem, joint swelling, myalgias and neck pain.  Skin:  Negative for color change and rash.  Neurological:  Positive for dizziness and light-headedness. Negative for seizures, syncope, speech difficulty, weakness, numbness and headaches.  Hematological:  Does not bruise/bleed easily.  Psychiatric/Behavioral:  Negative for confusion and decreased concentration.   All other systems reviewed and are negative.  Physical Exam Updated Vital Signs BP (!) 113/59    Pulse (!) 53    Temp 97.8 F (36.6 C) (Oral)    Resp 15    Ht 5\' 5"  (1.651 m)    Wt 117.9 kg    SpO2 100%    BMI 43.27 kg/m  Physical Exam Vitals and nursing note reviewed.  Constitutional:      General: She is not in  acute distress.    Appearance: Normal appearance. She is well-developed. She is not ill-appearing, toxic-appearing or diaphoretic.  HENT:     Head: Normocephalic and atraumatic.     Right Ear: External ear normal.     Left Ear: External ear normal.     Nose: Nose normal.  Eyes:     General: No scleral icterus.    Extraocular Movements: Extraocular movements intact.     Conjunctiva/sclera: Conjunctivae normal.  Cardiovascular:     Rate and Rhythm: Normal rate and regular rhythm.     Heart sounds: No murmur heard. Pulmonary:     Effort: Pulmonary effort is normal. No respiratory distress.  Chest:     Chest wall: No tenderness.  Abdominal:     General: Abdomen is flat.     Palpations: Abdomen is soft.     Tenderness: There is no abdominal tenderness.  Musculoskeletal:        General: No swelling or deformity. Normal range of motion.     Cervical back: Normal range of motion and neck supple. No rigidity.  Skin:    General: Skin is warm and dry.     Coloration: Skin is not jaundiced or pale.  Neurological:     General: No focal deficit present.     Mental Status: She is alert and oriented to person, place, and time.     Cranial Nerves: No cranial  nerve deficit.     Sensory: No sensory deficit.     Motor: No weakness.     Coordination: Coordination normal.  Psychiatric:        Mood and Affect: Mood normal.        Behavior: Behavior normal.        Thought Content: Thought content normal.        Judgment: Judgment normal.    ED Results / Procedures / Treatments   Labs (all labs ordered are listed, but only abnormal results are displayed) Labs Reviewed  CBC WITH DIFFERENTIAL/PLATELET - Abnormal; Notable for the following components:      Result Value   Hemoglobin 9.8 (*)    HCT 32.7 (*)    MCV 78.4 (*)    MCH 23.5 (*)    RDW 16.3 (*)    All other components within normal limits  COMPREHENSIVE METABOLIC PANEL - Abnormal; Notable for the following components:   Potassium 3.3 (*)    CO2 21 (*)    Calcium 7.8 (*)    Total Protein 5.7 (*)    Albumin 3.0 (*)    All other components within normal limits  PREGNANCY, URINE  URINALYSIS, ROUTINE W REFLEX MICROSCOPIC  MAGNESIUM  CBG MONITORING, ED  TROPONIN I (HIGH SENSITIVITY)    EKG EKG Interpretation  Date/Time:  Sunday January 25 2021 16:16:15 EST Ventricular Rate:  58 PR Interval:  183 QRS Duration: 113 QT Interval:  435 QTC Calculation: 428 R Axis:   33 Text Interpretation: Sinus rhythm Borderline intraventricular conduction delay Confirmed by Godfrey Pick [phone]) on 01/25/2021 4:17:51 PM  Radiology DG Chest Port 1 View  Result Date: 01/25/2021 CLINICAL DATA:  Palpitations. Chest discomfort and shortness of breath since last night. EXAM: PORTABLE CHEST 1 VIEW COMPARISON:  03/24/2015. FINDINGS: Cardiac silhouette is normal in size. Normal mediastinal and hilar contours. Clear lungs.  No pleural effusion or pneumothorax. Skeletal structures are grossly intact. IMPRESSION: No active disease. Electronically Signed   By: Lajean Manes M.D.   On: 01/25/2021 16:45    Procedures Procedures  Medications Ordered in ED Medications  calcium gluconate 1 g in sodium chloride  0.9 % 100 mL IVPB (1 g Intravenous Not Given 01/25/21 1819)  lactated ringers bolus 1,000 mL (0 mLs Intravenous Stopped 01/25/21 1815)  potassium chloride SA (KLOR-CON M) CR tablet 40 mEq (40 mEq Oral Given 01/25/21 1807)  magnesium oxide (MAG-OX) tablet 800 mg (800 mg Oral Given 01/25/21 1807)  calcium gluconate in NaCl 1-0.675 GM/50ML-% IVPB (0 g  Stopped 01/25/21 1907)    ED Course/ Medical Decision Making/ A&P                           Medical Decision Making  Patient presents for palpitations starting last night and continuing this morning.  Symptoms have resolved upon arrival in the ED.  Patient states that her heart rate is typically on the low side.  This is consistent with her heart rate on arrival at 55 bpm.  She does take metoprolol daily.  Given her history, do not suspect that symptoms are related to intoxication or withdrawal.  Symptoms may be attributable to low blood sugar.  She does take Saxenda for weight loss and appetite suppression.  She does not check her blood sugar at home.  She has had symptomatic hypoglycemia in the past.  EKG on arrival shows normal sinus rhythm.  Patient was kept on bedside cardiac monitor.  Labs were obtained to assess for underlying metabolic abnormalities and/or anemia.  Results were reviewed and patient was found to have hypokalemia and hypocalcemia.  Magnesium was low-normal.  Electrolyte replacements were given in the ED to optimize levels.  Cardiac monitor data was reviewed.  Patient did not have any arrhythmias while in the ED.  She also had no return of symptoms.  Patient was advised to follow-up with her primary care doctor for ongoing medication management, and to return to the ED if she does experience any further concerning symptoms.  She was discharged in good condition.        Final Clinical Impression(s) / ED Diagnoses Final diagnoses:  Palpitations    Rx / DC Orders ED Discharge Orders     None         Godfrey Pick, MD 01/25/21  1909

## 2021-01-25 NOTE — ED Triage Notes (Signed)
At 2200 last night felt faint at church, states feels like heart is out of rhythm  Denies pain but states SOB  H/o same but no result from halter monitor 1 year ago States sinus congestion that has been going on x 2 weeks

## 2021-02-10 LAB — HM PAP SMEAR: HM Pap smear: NEGATIVE

## 2021-02-10 LAB — HM MAMMOGRAPHY

## 2021-02-17 ENCOUNTER — Encounter: Payer: Self-pay | Admitting: Internal Medicine

## 2021-02-23 MED ORDER — NITROFURANTOIN MONOHYD MACRO 100 MG PO CAPS: 100.0000 mg | ORAL_CAPSULE | Freq: Two times a day (BID) | ORAL | 0 refills | Status: DC

## 2021-04-16 ENCOUNTER — Other Ambulatory Visit: Payer: Self-pay | Admitting: Internal Medicine

## 2021-05-01 ENCOUNTER — Ambulatory Visit
Admission: RE | Admit: 2021-05-01 | Discharge: 2021-05-01 | Disposition: A | Discharge status: Home / Self Care | Payer: BC Managed Care – PPO | Source: Ambulatory Visit | Attending: Emergency Medicine | Admitting: Emergency Medicine

## 2021-05-01 VITALS — BP 105/71 | HR 44 | Temp 97.7°F | Resp 18

## 2021-05-01 DIAGNOSIS — N3289 Other specified disorders of bladder: Secondary | ICD-10-CM | POA: Insufficient documentation

## 2021-05-01 DIAGNOSIS — Z113 Encounter for screening for infections with a predominantly sexual mode of transmission: Secondary | ICD-10-CM | POA: Diagnosis not present

## 2021-05-01 DIAGNOSIS — Z87440 Personal history of urinary (tract) infections: Secondary | ICD-10-CM | POA: Insufficient documentation

## 2021-05-01 DIAGNOSIS — N898 Other specified noninflammatory disorders of vagina: Secondary | ICD-10-CM | POA: Diagnosis present

## 2021-05-01 DIAGNOSIS — R32 Unspecified urinary incontinence: Secondary | ICD-10-CM | POA: Diagnosis not present

## 2021-05-01 DIAGNOSIS — N3281 Overactive bladder: Secondary | ICD-10-CM | POA: Insufficient documentation

## 2021-05-01 DIAGNOSIS — N760 Acute vaginitis: Secondary | ICD-10-CM | POA: Insufficient documentation

## 2021-05-01 DIAGNOSIS — R3914 Feeling of incomplete bladder emptying: Secondary | ICD-10-CM | POA: Diagnosis present

## 2021-05-01 LAB — POCT URINALYSIS DIP (MANUAL ENTRY)
Bilirubin, UA: NEGATIVE
Blood, UA: NEGATIVE
Glucose, UA: NEGATIVE mg/dL
Ketones, POC UA: NEGATIVE mg/dL
Leukocytes, UA: NEGATIVE
Nitrite, UA: NEGATIVE
Protein Ur, POC: NEGATIVE mg/dL
Spec Grav, UA: 1.025 (ref 1.010–1.025)
Urobilinogen, UA: 1 E.U./dL
pH, UA: 5.5 (ref 5.0–8.0)

## 2021-05-01 MED ORDER — FLUCONAZOLE 150 MG PO TABS: ORAL_TABLET | ORAL | 0 refills | Status: DC

## 2021-05-01 MED ORDER — SOLIFENACIN SUCCINATE 5 MG PO TABS: 5.0000 mg | ORAL_TABLET | Freq: Every day | ORAL | 0 refills | Status: AC

## 2021-05-01 MED ORDER — METRONIDAZOLE 0.75 % VA GEL: 1.0000 | Freq: Every day | VAGINAL | 0 refills | Status: AC

## 2021-05-01 MED ORDER — SULFAMETHOXAZOLE-TRIMETHOPRIM 800-160 MG PO TABS
1.0000 | ORAL_TABLET | Freq: Two times a day (BID) | ORAL | 0 refills | Status: AC
Start: 2021-05-01 — End: 2021-05-06

## 2021-05-01 MED ORDER — OXYBUTYNIN CHLORIDE 5 MG PO TABS: 5.0000 mg | ORAL_TABLET | Freq: Three times a day (TID) | ORAL | 0 refills | Status: AC

## 2021-05-01 MED ORDER — DARIFENACIN HYDROBROMIDE ER 7.5 MG PO TB24: 7.5000 mg | ORAL_TABLET | Freq: Every day | ORAL | 0 refills | Status: AC

## 2021-05-01 NOTE — Discharge Instructions (Addendum)
The urinalysis that we performed in the clinic today was abnormal.  Urine culture will be performed per our protocol.  The result of the urine culture will be available in the next 3 to 5 days and will be posted to your MyChart account.  If there is an abnormal finding, you will be contacted by phone and advised of further treatment recommendations, if any. ?  ?You were advised to begin antibiotics today because you are having active symptoms of a urinary tract infection.  It is very important that you take all doses exactly as prescribed.  Incomplete antibiotic therapy can cause worsening urinary tract infection that can become aggressive, reach the level of your kidneys causing kidney infection and possible hospitalization. ?  ?If you receive a phone call advising you that your urine culture is negative but you are feeling significantly better after starting antibiotics, I recommend that you complete the full course of antibiotics as prescribed.   ?  ?If you receive a phone call advising you that your urine culture is negative and you are not feeling significantly better after starting antibiotics, please feel free to discontinue antibiotics as they are no longer indicated. ?  ?Because we know that antibiotic treatment can often cause vaginal yeast infections, I have also provided you with a prescription for fluconazole (Diflucan).  Please take the first tablet on the third day of your antibiotics and take the second tablet two days after the first tablet. ? ?The results of your vaginal testing today will be made available to you once they are complete, this typically takes 3 to 5 days.  This test covers 5 offending organisms, yeast, Gardnerella which causes BV, gonorrhea, trichomonas and chlamydia.  Unfortunately, I cannot order the yeast and BV tests separately.  The result will initially be posted to your MyChart and, if any of your results are abnormal, you will receive a phone call with those results along  with further instructions regarding treatment and any prescriptions, if needed.  ?  ?If you have not had complete resolution of your symptoms after completing treatment, please return for repeat evaluation either here at urgent care or with your gynecologist. ? ?Per our discussion regarding medications to treat bladder spasm, I have sent prescriptions for 3 alternative agents that you may tolerate better than the long-acting oxybutynin that you are currently taking.   ? ?The first is short acting oxybutynin.  This medication is typically taken 2-3 times daily, I have written it for 3 times daily but if that is too much you can cut back to 2 times daily.  You can also break the tablets in half if you wish. ? ?The second is for darifenacin (Enablex) which is taken once daily.   ? ?The third is Solifenacin (Vesicare) which is also taken once daily.   ? ?The interface between your insurance and our EMR system does not show whether or not any of these medications are covered.  I have advised the pharmacist to fill the prescription that is best covered and, if needed, send me a prior authorization to complete if necessary. ? ?If you find that these other options are more effective than what you are currently taking, please reach out to your gynecologist for further refills. ?  ?Thank you for visiting urgent care today.  I appreciate the opportunity to participate in your care. ? ? ?

## 2021-05-01 NOTE — ED Triage Notes (Signed)
Pt c/o vaginal odor, frequent urination, not being able to completely empty bladder, and bladder spasms that began last week. ? ?She states she does take oxybutynin. ?

## 2021-05-01 NOTE — ED Provider Notes (Signed)
?[facility] ? ? ? ?CSN: [id number] ?Arrival date & time: 05/01/21  0807 ?  ? ?HISTORY  ? ?Chief Complaint  ?Patient presents with  ? Urinary Retention  ?  I believe I have a UTI, bladder or kidney infection. - Entered by patient  ? Urinary Frequency  ? ?HPI ?[name] is a 51 y.o. female. Pt c/o vaginal odor, frequent urination, not being able to completely empty bladder, and bladder spasms that began last week.  Patient states she takes oxybutynin for overactive bladder, it is a 10 mg extended release version.  Patient states she cannot not tolerate taking it every day, sometimes takes it every other day or every third day depending.  Patient states if she goes too long, then she begins to have incontinence if she takes it too often she has urinary retention.  Patient states she was treated recently for UTI with Macrobid, thinks this was a few months ago.  Patient denies pelvic pain, flank pain, fever, aches, chills.  Patient states she has noticed that she had a little more vaginal discharge than usual which has a fishy smell. ? ?The history is provided by the patient.  ?Past Medical History:  ?Diagnosis Date  ? Anemia   ? Asthma   ? Bulging disc   ? Hypertension   ? Obesity   ? PONV (postoperative nausea and vomiting)   ? PT IS [employer]  ? ?Patient Active Problem List  ? Diagnosis Date Noted  ? Other fatigue 10/30/2020  ? Epigastric pain 08/21/2019  ? Finger pain, left 08/03/2019  ? Eustachian tube dysfunction 04/10/2019  ? Degenerative arthritis of right knee 07/05/2016  ? Excessive and redundant skin and subcutaneous tissue 03/30/2016  ? Chronic sinusitis 01/05/2016  ? Routine general medical examination at a health care facility 09/30/2015  ? Adjustment disorder with mixed anxiety and depressed mood 07/04/2015  ? B12 deficiency 09/25/2014  ? Chondromalacia patellae of left knee 05/28/2014  ? GERD (gastroesophageal reflux disease) 03/05/2014  ? Morbid obesity (HCC) 03/05/2014  ?  Perennial and seasonal allergic rhinitis 03/05/2014  ? Left knee pain 03/05/2014  ? Essential hypertension 03/05/2014  ? Mild persistent asthma 03/05/2014  ? Hx of gastric bypass 03/05/2014  ? ?Past Surgical History:  ?Procedure Laterality Date  ? CHOLECYSTECTOMY    ? DILITATION & CURRETTAGE/HYSTROSCOPY WITH NOVASURE ABLATION N/A 09/20/2013  ? Procedure: DILATATION & CURETTAGE/HYSTEROSCOPY WITH NOVASURE ABLATION;  Surgeon: Juluis Mire, MD;  Location: [facility];  Service: Gynecology;  Laterality: N/A;  ? GASTRIC BYPASS    ? LAPAROSCOPIC TUBAL LIGATION Bilateral 09/20/2013  ? Procedure: LAPAROSCOPIC TUBAL LIGATION;  Surgeon: Juluis Mire, MD;  Location: [facility];  Service: Gynecology;  Laterality: Bilateral;  ? ?OB History   ?No obstetric history on file. ?  ? ?Home Medications   ? ?Prior to Admission medications   ?Medication Sig Start Date End Date Taking? Authorizing Provider  ?albuterol (PROAIR HFA) 108 (90 Base) MCG/ACT inhaler Inhale 2 puffs into the lungs every 6 (six) hours as needed for wheezing or shortness of breath. 04/10/19   Bobbitt, Heywood Iles, MD  ?ALPRAZolam Prudy Feeler) 0.25 MG tablet TAKE 1 TABLET BY MOUTH ONCE DAILY AS NEEDED FOR ANXIETY 12/25/20   Myrlene Broker, MD  ?Azelastine-Fluticasone 137-50 MCG/ACT SUSP Place 1 spray into the nose 2 (two) times daily as needed. 04/10/19   Bobbitt, Heywood Iles, MD  ?cetirizine (ZYRTEC) 10 MG tablet Take 10 mg by mouth daily.    [provider]  ?clotrimazole-betamethasone (LOTRISONE) cream Apply 1 application topically 2 (two) times daily. 07/20/19   Myrlene Brokerrawford, Elizabeth A, MD  ?fluticasone (FLOVENT HFA) 110 MCG/ACT inhaler Inhale 2 puffs into the lungs 2 (two) times daily. 07/24/18   Bobbitt, Heywood Ilesalph Carter, MD  ?Fluticasone Propionate Timmothy Sours(XHANCE) 93 MCG/ACT EXHU 2 sprays per nostril twice daily as needed for stuffy nose. 08/13/19   Bobbitt, Heywood Ilesalph Carter, MD  ?furosemide (LASIX) 20 MG tablet Take 1 tablet (20 mg total) by mouth daily. 08/30/16   Judi SaaSmith, Zachary  M, DO  ?hydrocortisone (ANUSOL-HC) 2.5 % rectal cream Place 1 application rectally 2 (two) times daily. 11/17/20   Myrlene Brokerrawford, Elizabeth A, MD  ?hydrocortisone (ANUSOL-HC) 25 MG suppository Place 1 suppository (25 mg total) rectally 2 (two) times daily. 10/30/20   Myrlene Brokerrawford, Elizabeth A, MD  ?hyoscyamine (LEVSIN SL) 0.125 MG SL tablet DISSOLVE 1 TABLET UNDER THE TONGUE EVERY 4 HOURS AS NEEDED 02/15/19   Myrlene Brokerrawford, Elizabeth A, MD  ?metoprolol tartrate (LOPRESSOR) 50 MG tablet TAKE 1/2 (ONE-HALF) TABLET BY MOUTH TWICE DAILY **ANNUAL  APPOINTMENT  DUE  IN  OCTOBER  MUST  SEE  PROVIDER  FOR  FUTURE  REFILLS** 11/26/20   Myrlene Brokerrawford, Elizabeth A, MD  ?montelukast (SINGULAIR) 10 MG tablet Take 1 tablet (10 mg total) by mouth at bedtime. 11/27/20   Myrlene Brokerrawford, Elizabeth A, MD  ?mupirocin cream (BACTROBAN) 2 % Apply 1 application topically 2 (two) times daily. 04/07/20   Myrlene Brokerrawford, Elizabeth A, MD  ?mupirocin ointment (BACTROBAN) 2 % Apply 1 application topically daily. 04/08/20   Myrlene Brokerrawford, Elizabeth A, MD  ?nitrofurantoin, macrocrystal-monohydrate, (MACROBID) 100 MG capsule Take 1 capsule (100 mg total) by mouth 2 (two) times daily. 02/23/21   Myrlene Brokerrawford, Elizabeth A, MD  ?omeprazole (PRILOSEC) 40 MG capsule Take 1 capsule by mouth once daily 11/26/20   Myrlene Brokerrawford, Elizabeth A, MD  ?[facility] 18 MG/3ML SOPN Inject into the skin. 10/02/20   [provider]  ?sodium chloride (OCEAN) 0.65 % SOLN nasal spray Place 1 spray into both nostrils as needed for congestion. 03/25/15   Horton, Mayer Maskerourtney F, MD  ?SUMAtriptan (IMITREX) 50 MG tablet Take 1 tablet (50 mg total) by mouth every 2 (two) hours as needed for migraine. May repeat in 2 hours if headache persists or recurs. 06/01/17   Myrlene Brokerrawford, Elizabeth A, MD  ?Syringe/Needle, Disp, (SYRINGE 3CC/25GX1-1/2") 25G X 1-1/2" 3 ML MISC Used to inject Vit B12 03/13/18   Myrlene Brokerrawford, Elizabeth A, MD  ?traMADol (ULTRAM) 50 MG tablet Take 1 tablet (50 mg total) by mouth at bedtime as needed. 12/25/20   Myrlene Brokerrawford,  Elizabeth A, MD  ? ?Family History ?Family History  ?Problem Relation Age of Onset  ? Breast cancer Mother   ? Hypertension Sister   ? Stroke Sister   ? Asthma Brother   ? Colon cancer Brother   ? Allergic rhinitis Neg Hx   ? Eczema Neg Hx   ? Immunodeficiency Neg Hx   ? Urticaria Neg Hx   ? Angioedema Neg Hx   ? ?Social History ?Social History  ? ?Tobacco Use  ? Smoking status: Never  ? Smokeless tobacco: Never  ?Substance Use Topics  ? Alcohol use: No  ? Drug use: No  ? ?Allergies   ?Patient has no known allergies. ? ?Review of Systems ?Review of Systems ?Pertinent findings noted in history of present illness.  ? ?Physical Exam ?Triage Vital Signs ?ED Triage Vitals  ?Enc Vitals Group  ?   BP 11/21/20 0827 (!) 147/82  ?  Pulse Rate 11/21/20 0827 72  ?   Resp 11/21/20 0827 18  ?   Temp 11/21/20 0827 98.3 ?F (36.8 ?C)  ?   Temp Source 11/21/20 0827 Oral  ?   SpO2 11/21/20 0827 98 %  ?   Weight --   ?   Height --   ?   Head Circumference --   ?   Peak Flow --   ?   Pain Score 11/21/20 0826 5  ?   Pain Loc --   ?   Pain Edu? --   ?   Excl. in GC? --   ?No data found. ? ?Updated Vital Signs ?BP 105/71 (BP Location: Left Arm)   Pulse (!) 44 Comment: provider aware  Temp 97.7 ?F (36.5 ?C) (Oral)   Resp 18   SpO2 99%  ? ?Physical Exam ?Vitals and nursing note reviewed.  ?Constitutional:   ?   General: She is not in acute distress. ?   Appearance: Normal appearance. She is not ill-appearing.  ?HENT:  ?   Head: Normocephalic and atraumatic.  ?Eyes:  ?   General: Lids are normal.     ?   Right eye: No discharge.     ?   Left eye: No discharge.  ?   Extraocular Movements: Extraocular movements intact.  ?   Conjunctiva/sclera: Conjunctivae normal.  ?   Right eye: Right conjunctiva is not injected.  ?   Left eye: Left conjunctiva is not injected.  ?Neck:  ?   Trachea: Trachea and phonation normal.  ?Cardiovascular:  ?   Rate and Rhythm: Normal rate and regular rhythm.  ?   Pulses: Normal pulses.  ?   Heart sounds: Normal  heart sounds. No murmur heard. ?  No friction rub. No gallop.  ?Pulmonary:  ?   Effort: Pulmonary effort is normal. No accessory muscle usage, prolonged expiration or respiratory distress.  ?   Breath sounds: Normal

## 2021-05-03 LAB — URINE CULTURE: Culture: NO GROWTH

## 2021-05-04 LAB — CERVICOVAGINAL ANCILLARY ONLY
Bacterial Vaginitis (gardnerella): NEGATIVE
Candida Glabrata: NEGATIVE
Candida Vaginitis: POSITIVE — AB
Chlamydia: NEGATIVE
Comment: NEGATIVE
Comment: NEGATIVE
Comment: NEGATIVE
Comment: NEGATIVE
Comment: NEGATIVE
Comment: NORMAL
Neisseria Gonorrhea: NEGATIVE
Trichomonas: NEGATIVE

## 2021-05-15 ENCOUNTER — Encounter: Payer: Self-pay | Admitting: Internal Medicine

## 2021-05-19 ENCOUNTER — Encounter: Payer: Self-pay | Admitting: Internal Medicine

## 2021-05-21 ENCOUNTER — Encounter: Payer: Self-pay | Admitting: Internal Medicine

## 2021-05-21 DIAGNOSIS — Z9884 Bariatric surgery status: Secondary | ICD-10-CM

## 2021-05-22 ENCOUNTER — Other Ambulatory Visit (INDEPENDENT_AMBULATORY_CARE_PROVIDER_SITE_OTHER): Payer: BC Managed Care – PPO

## 2021-05-22 DIAGNOSIS — Z9884 Bariatric surgery status: Secondary | ICD-10-CM | POA: Diagnosis not present

## 2021-05-22 LAB — MAGNESIUM: Magnesium: 1.9 mg/dL (ref 1.5–2.5)

## 2021-05-22 LAB — COMPREHENSIVE METABOLIC PANEL
ALT: 16 U/L (ref 0–35)
AST: 22 U/L (ref 0–37)
Albumin: 3.4 g/dL — ABNORMAL LOW (ref 3.5–5.2)
Alkaline Phosphatase: 62 U/L (ref 39–117)
BUN: 13 mg/dL (ref 6–23)
CO2: 31 mEq/L (ref 19–32)
Calcium: 8.4 mg/dL (ref 8.4–10.5)
Chloride: 106 mEq/L (ref 96–112)
Creatinine, Ser: 0.55 mg/dL (ref 0.40–1.20)
GFR: 107.38 mL/min (ref >60.00)
Glucose, Bld: 74 mg/dL (ref 70–99)
Potassium: 3.7 mEq/L (ref 3.5–5.1)
Sodium: 141 mEq/L (ref 135–145)
Total Bilirubin: 0.4 mg/dL (ref 0.2–1.2)
Total Protein: 5.5 g/dL — ABNORMAL LOW (ref 6.0–8.3)

## 2021-05-22 LAB — VITAMIN D 25 HYDROXY (VIT D DEFICIENCY, FRACTURES): VITD: 25.37 ng/mL — ABNORMAL LOW (ref 30.00–100.00)

## 2021-05-22 LAB — VITAMIN B12: Vitamin B-12: 492 pg/mL (ref 211–911)

## 2021-06-26 ENCOUNTER — Encounter: Payer: Self-pay | Admitting: Internal Medicine

## 2021-07-08 ENCOUNTER — Other Ambulatory Visit: Payer: Self-pay | Admitting: Internal Medicine

## 2021-08-10 ENCOUNTER — Encounter: Payer: Self-pay | Admitting: Internal Medicine

## 2021-08-10 ENCOUNTER — Ambulatory Visit: Payer: BC Managed Care – PPO | Admitting: Internal Medicine

## 2021-08-10 VITALS — BP 126/68 | HR 52 | Resp 18 | Ht 65.0 in | Wt 287.4 lb

## 2021-08-10 DIAGNOSIS — R339 Retention of urine, unspecified: Secondary | ICD-10-CM

## 2021-08-10 DIAGNOSIS — R2241 Localized swelling, mass and lump, right lower limb: Secondary | ICD-10-CM

## 2021-08-10 LAB — CBC
HCT: 36.3 % (ref 36.0–46.0)
Hemoglobin: 11.8 g/dL — ABNORMAL LOW (ref 12.0–15.0)
MCHC: 32.6 g/dL (ref 30.0–36.0)
MCV: 86.5 fl (ref 78.0–100.0)
Platelets: 173 10*3/uL (ref 150.0–400.0)
RBC: 4.19 Mil/uL (ref 3.87–5.11)
RDW: 18.9 % — ABNORMAL HIGH (ref 11.5–15.5)
WBC: 4.7 10*3/uL (ref 4.0–10.5)

## 2021-08-10 LAB — COMPREHENSIVE METABOLIC PANEL
ALT: 16 U/L (ref 0–35)
AST: 24 U/L (ref 0–37)
Albumin: 3.7 g/dL (ref 3.5–5.2)
Alkaline Phosphatase: 55 U/L (ref 39–117)
BUN: 12 mg/dL (ref 6–23)
CO2: 27 mEq/L (ref 19–32)
Calcium: 8.4 mg/dL (ref 8.4–10.5)
Chloride: 107 mEq/L (ref 96–112)
Creatinine, Ser: 0.47 mg/dL (ref 0.40–1.20)
GFR: 111.35 mL/min (ref >60.00)
Glucose, Bld: 76 mg/dL (ref 70–99)
Potassium: 3.4 mEq/L — ABNORMAL LOW (ref 3.5–5.1)
Sodium: 140 mEq/L (ref 135–145)
Total Bilirubin: 0.4 mg/dL (ref 0.2–1.2)
Total Protein: 5.7 g/dL — ABNORMAL LOW (ref 6.0–8.3)

## 2021-08-10 LAB — BRAIN NATRIURETIC PEPTIDE: Pro B Natriuretic peptide (BNP): 78 pg/mL (ref 0.0–100.0)

## 2021-08-10 NOTE — Progress Notes (Signed)
   Subjective:   Patient ID: [name], female    DOB: [birth year], 51 y.o.   MRN: [id number]  HPI The patient is a 51 YO female coming in for right leg swelling and pain for 2 days. Prior superficial blood clot and feels similar maybe.  Review of Systems  Constitutional: Negative.   HENT: Negative.    Eyes: Negative.   Respiratory:  Negative for cough, chest tightness and shortness of breath.   Cardiovascular:  Positive for leg swelling. Negative for chest pain and palpitations.  Gastrointestinal:  Negative for abdominal distention, abdominal pain, constipation, diarrhea, nausea and vomiting.  Musculoskeletal:  Positive for myalgias.  Skin: Negative.   Neurological: Negative.   Psychiatric/Behavioral: Negative.      Objective:  Physical Exam Constitutional:      Appearance: She is well-developed. She is obese.  HENT:     Head: Normocephalic and atraumatic.  Cardiovascular:     Rate and Rhythm: Normal rate and regular rhythm.  Pulmonary:     Effort: Pulmonary effort is normal. No respiratory distress.     Breath sounds: Normal breath sounds. No wheezing or rales.  Abdominal:     General: Bowel sounds are normal. There is no distension.     Palpations: Abdomen is soft.     Tenderness: There is no abdominal tenderness. There is no rebound.  Musculoskeletal:     Cervical back: Normal range of motion.     Right lower leg: Edema present.     Left lower leg: Edema present.     Comments: Left leg with 1+ edema to knees, right leg with 2+ edema to knees and right calf larger than left  Skin:    General: Skin is warm and dry.  Neurological:     Mental Status: She is alert and oriented to person, place, and time.     Coordination: Coordination normal.     Vitals:   08/10/21 1133  BP: 126/68  Pulse: (!) 52  Resp: 18  SpO2: 99%  Weight: 287 lb 6.4 oz (130.4 kg)  Height: 5\' 5"  (1.651 m)    Assessment & Plan:

## 2021-08-10 NOTE — Assessment & Plan Note (Signed)
Needs [place] to rule out DVT bilaterally. Checking CBC, CMP, and BNP as well to rule out new heart failure and renal dysfunction and blood counts in case anticoagulation indicated.

## 2021-08-10 NOTE — Patient Instructions (Signed)
We will check the labs and the ultrasound to check the swelling.

## 2021-08-14 ENCOUNTER — Ambulatory Visit (HOSPITAL_COMMUNITY)
Admission: RE | Admit: 2021-08-14 | Discharge: 2021-08-14 | Disposition: A | Discharge status: Home / Self Care | Payer: BC Managed Care – PPO | Source: Ambulatory Visit | Attending: Internal Medicine | Admitting: Internal Medicine

## 2021-08-14 DIAGNOSIS — R2241 Localized swelling, mass and lump, right lower limb: Secondary | ICD-10-CM

## 2021-08-28 ENCOUNTER — Encounter: Payer: Self-pay | Admitting: Internal Medicine

## 2021-08-28 ENCOUNTER — Other Ambulatory Visit (INDEPENDENT_AMBULATORY_CARE_PROVIDER_SITE_OTHER): Payer: BC Managed Care – PPO

## 2021-08-28 DIAGNOSIS — R339 Retention of urine, unspecified: Secondary | ICD-10-CM

## 2021-08-28 LAB — URINALYSIS, ROUTINE W REFLEX MICROSCOPIC
Bilirubin Urine: NEGATIVE
Hgb urine dipstick: NEGATIVE
Ketones, ur: NEGATIVE
Leukocytes,Ua: NEGATIVE
Nitrite: NEGATIVE
RBC / HPF: NONE SEEN (ref 0–?)
Specific Gravity, Urine: 1.01 (ref 1.000–1.030)
Total Protein, Urine: NEGATIVE
Urine Glucose: NEGATIVE
Urobilinogen, UA: 1 (ref 0.0–1.0)
pH: 7 (ref 5.0–8.0)

## 2021-08-29 LAB — URINE CULTURE

## 2021-09-29 ENCOUNTER — Other Ambulatory Visit: Payer: Self-pay | Admitting: Internal Medicine

## 2021-11-01 ENCOUNTER — Other Ambulatory Visit: Payer: Self-pay | Admitting: Internal Medicine

## 2021-11-20 ENCOUNTER — Encounter: Payer: Self-pay | Admitting: Internal Medicine

## 2021-11-20 ENCOUNTER — Telehealth: Payer: Self-pay

## 2021-11-20 ENCOUNTER — Ambulatory Visit (INDEPENDENT_AMBULATORY_CARE_PROVIDER_SITE_OTHER): Payer: BC Managed Care – PPO | Admitting: Internal Medicine

## 2021-11-20 VITALS — BP 122/98 | HR 93 | Temp 97.8°F | Ht 65.0 in | Wt 290.5 lb

## 2021-11-20 DIAGNOSIS — Z23 Encounter for immunization: Secondary | ICD-10-CM

## 2021-11-20 DIAGNOSIS — Z0000 Encounter for general adult medical examination without abnormal findings: Secondary | ICD-10-CM

## 2021-11-20 DIAGNOSIS — I10 Essential (primary) hypertension: Secondary | ICD-10-CM

## 2021-11-20 DIAGNOSIS — Z9884 Bariatric surgery status: Secondary | ICD-10-CM | POA: Diagnosis not present

## 2021-11-20 DIAGNOSIS — K2100 Gastro-esophageal reflux disease with esophagitis, without bleeding: Secondary | ICD-10-CM

## 2021-11-20 DIAGNOSIS — J4530 Mild persistent asthma, uncomplicated: Secondary | ICD-10-CM

## 2021-11-20 LAB — LIPID PANEL
Cholesterol: 127 mg/dL (ref 0–200)
HDL: 58.8 mg/dL (ref >39.00)
LDL Cholesterol: 61 mg/dL (ref 0–99)
NonHDL: 68.6
Total CHOL/HDL Ratio: 2
Triglycerides: 39 mg/dL (ref 0.0–149.0)
VLDL: 7.8 mg/dL (ref 0.0–40.0)

## 2021-11-20 MED ORDER — SEMAGLUTIDE-WEIGHT MANAGEMENT 0.5 MG/0.5ML ~~LOC~~ SOAJ: 0.5000 mg | SUBCUTANEOUS | 0 refills | Status: DC

## 2021-11-20 MED ORDER — SEMAGLUTIDE-WEIGHT MANAGEMENT 2.4 MG/0.75ML ~~LOC~~ SOAJ: 2.4000 mg | SUBCUTANEOUS | 0 refills | Status: DC

## 2021-11-20 MED ORDER — SEMAGLUTIDE-WEIGHT MANAGEMENT 0.25 MG/0.5ML ~~LOC~~ SOAJ: 0.2500 mg | SUBCUTANEOUS | 0 refills | Status: DC

## 2021-11-20 MED ORDER — SEMAGLUTIDE-WEIGHT MANAGEMENT 1.7 MG/0.75ML ~~LOC~~ SOAJ: 1.7000 mg | SUBCUTANEOUS | 0 refills | Status: DC

## 2021-11-20 MED ORDER — SEMAGLUTIDE-WEIGHT MANAGEMENT 1 MG/0.5ML ~~LOC~~ SOAJ: 1.0000 mg | SUBCUTANEOUS | 0 refills | Status: DC

## 2021-11-20 NOTE — Assessment & Plan Note (Signed)
Taking omeprazole 40 mg prn and will try pepcid daily to help.

## 2021-11-20 NOTE — Assessment & Plan Note (Signed)
Recent vitamin levels stable. Continue multivitamin daily.

## 2021-11-20 NOTE — Assessment & Plan Note (Signed)
No flare today. Using singulair and flonase and flovent. Albuterol prn. Continue.

## 2021-11-20 NOTE — Assessment & Plan Note (Signed)
BP elevated today she has not taken meds today. Continue lasix 20 mg daily and metoprolol 25 mg BID. If home readings elevate we will adjust meds.

## 2021-11-20 NOTE — Assessment & Plan Note (Signed)
Struggling a lot with weight. Previously did well on saxenda. Rx wegovy done today to see if she has coverage for this.

## 2021-11-20 NOTE — Assessment & Plan Note (Signed)
Flu shot given. Covid-19 counseled. Shingrix given 1st. Tetanus up to date. Colonoscopy up to date. Mammogram up to date, pap smear up to date. Counseled about sun safety and mole surveillance. Counseled about the dangers of distracted driving. Given 10 year screening recommendations.   

## 2021-11-20 NOTE — Telephone Encounter (Signed)
-----   Message from Hoyt Koch, MD sent at 11/20/2021 11:46 AM EDT ----- Normal/stable labs. No changes needed.

## 2021-11-20 NOTE — Progress Notes (Signed)
   Subjective:   Patient ID: [name], female    DOB: [birth year], 51 y.o.   MRN: [id number]  HPI The patient is here for physical. [facility] for pap/mammogram. [facility] for colonoscopy.  PMH, [facility], social history reviewed and updated  Review of Systems  Constitutional: Negative.   HENT: Negative.    Eyes: Negative.   Respiratory:  Negative for cough, chest tightness and shortness of breath.   Cardiovascular:  Negative for chest pain, palpitations and leg swelling.  Gastrointestinal:  Negative for abdominal distention, abdominal pain, constipation, diarrhea, nausea and vomiting.  Musculoskeletal:  Positive for arthralgias.  Skin: Negative.   Neurological: Negative.   Psychiatric/Behavioral: Negative.      Objective:  Physical Exam Constitutional:      Appearance: She is well-developed. She is obese.  HENT:     Head: Normocephalic and atraumatic.  Cardiovascular:     Rate and Rhythm: Normal rate and regular rhythm.  Pulmonary:     Effort: Pulmonary effort is normal. No respiratory distress.     Breath sounds: Normal breath sounds. No wheezing or rales.  Abdominal:     General: Bowel sounds are normal. There is no distension.     Palpations: Abdomen is soft.     Tenderness: There is no abdominal tenderness. There is no rebound.  Musculoskeletal:        General: Tenderness present.     Cervical back: Normal range of motion.  Skin:    General: Skin is warm and dry.  Neurological:     Mental Status: She is alert and oriented to person, place, and time.     Coordination: Coordination normal.     Vitals:   11/20/21 0825 11/20/21 0829  BP: (!) 124/98 (!) 122/98  Pulse: 93   Temp: 97.8 F (36.6 C)   TempSrc: Oral   SpO2: 99%   Weight: 290 lb 8 oz (131.8 kg)   Height: 5\' 5"  (1.651 m)     Assessment & Plan:  Flu shot and shingrix given at visit

## 2021-11-20 NOTE — Telephone Encounter (Signed)
Spoke with patient and went over lab results 

## 2021-11-26 ENCOUNTER — Telehealth: Payer: Self-pay

## 2021-11-26 NOTE — Telephone Encounter (Signed)
Started PA for [employer] waiting on approval.

## 2021-11-27 ENCOUNTER — Encounter: Payer: Self-pay | Admitting: Internal Medicine

## 2021-12-14 ENCOUNTER — Ambulatory Visit: Payer: BC Managed Care – PPO | Admitting: Internal Medicine

## 2021-12-14 ENCOUNTER — Encounter: Payer: Self-pay | Admitting: Family Medicine

## 2021-12-14 ENCOUNTER — Ambulatory Visit: Payer: BC Managed Care – PPO | Admitting: Family Medicine

## 2021-12-14 VITALS — BP 142/86 | HR 64 | Temp 97.6°F | Ht 65.0 in | Wt 300.0 lb

## 2021-12-14 DIAGNOSIS — Z9884 Bariatric surgery status: Secondary | ICD-10-CM | POA: Diagnosis not present

## 2021-12-14 DIAGNOSIS — D509 Iron deficiency anemia, unspecified: Secondary | ICD-10-CM

## 2021-12-14 DIAGNOSIS — E162 Hypoglycemia, unspecified: Secondary | ICD-10-CM

## 2021-12-14 NOTE — Patient Instructions (Signed)
Hypoglycemia Hypoglycemia is when the sugar (glucose) level in your blood is too low. Low blood sugar can happen to people who have diabetes and people who do not have diabetes. Low blood sugar can happen quickly, and it can be an emergency. What are the causes? This condition happens most often in people who have diabetes. It may be caused by: Diabetes medicine. Not eating enough, or not eating often enough. Doing more physical activity. Drinking alcohol on an empty stomach. If you do not have diabetes, this condition may be caused by: A tumor in the pancreas. Not eating enough, or not eating for long periods at a time (fasting). A very bad infection or illness. Problems after having weight loss (bariatric) surgery. Kidney failure or liver failure. Certain medicines. What increases the risk? This condition is more likely to develop in people who: Have diabetes and take medicines to lower their blood sugar. Abuse alcohol. Have a very bad illness. What are the signs or symptoms? Mild Hunger. Sweating and feeling clammy. Feeling dizzy or light-headed. Being sleepy or having trouble sleeping. Feeling like you may vomit (nauseous). A fast heartbeat. A headache. Blurry vision. Mood changes, such as: Being grouchy. Feeling worried or nervous (anxious). Tingling or loss of feeling (numbness) around your mouth, lips, or tongue. Moderate Confusion and poor judgment. Behavior changes. Weakness. Uneven heartbeat. Trouble with moving (coordination). Very low Very low blood sugar (severe hypoglycemia) is a medical emergency. It can cause: Fainting. Seizures. Loss of consciousness (coma). Death. How is this treated? Treating low blood sugar Low blood sugar is often treated by eating or drinking something that has sugar in it right away. The food or drink should contain 15 grams of a fast-acting carb (carbohydrate). Options include: 4 oz (120 mL) of fruit juice. 4 oz (120 mL) of  regular soda (not diet soda). A few pieces of hard candy. Check food labels to see how many pieces to eat for 15 grams. 1 Tbsp (15 mL) of sugar or honey. 4 glucose tablets. 1 tube of glucose gel. Treating low blood sugar if you have diabetes If you can think clearly and swallow safely, follow the 15:15 rule: Take 15 grams of a fast-acting carb. Talk with your doctor about how much you should take. Always keep a source of fast-acting carb with you, such as: Glucose tablets (take 4 tablets). A few pieces of hard candy. Check food labels to see how many pieces to eat for 15 grams. 4 oz (120 mL) of fruit juice. 4 oz (120 mL) of regular soda (not diet soda). 1 Tbsp (15 mL) of honey or sugar. 1 tube of glucose gel. Check your blood sugar 15 minutes after you take the carb. If your blood sugar is still at or below 70 mg/dL (3.9 mmol/L), take 15 grams of a carb again. If your blood sugar does not go above 70 mg/dL (3.9 mmol/L) after 3 tries, get help right away. After your blood sugar goes back to normal, eat a meal or a snack within 1 hour.  Treating very low blood sugar If your blood sugar is below 54 mg/dL (3 mmol/L), you have very low blood sugar, or severe hypoglycemia. This is an emergency. Get medical help right away. If you have very low blood sugar and you cannot eat or drink, you will need to be given a hormone called glucagon. A family member or friend should learn how to check your blood sugar and how to give you glucagon. Ask your doctor if  you need to have an emergency glucagon kit at home. Very low blood sugar may also need to be treated in a hospital. Follow these instructions at home: General instructions Take over-the-counter and prescription medicines only as told by your doctor. Stay aware of your blood sugar as told by your doctor. If you drink alcohol: Limit how much you have to: 0-1 drink a day for women who are not pregnant. 0-2 drinks a day for men. Know how much  alcohol is in your drink. In the [place] one drink equals one 12 oz bottle of beer (355 mL), one 5 oz glass of wine (148 mL), or one 1 oz glass of hard liquor (44 mL). Be sure to eat food when you drink alcohol. Know that your body absorbs alcohol quickly. This may lead to low blood sugar later. Be sure to keep checking your blood sugar. Keep all follow-up visits. If you have diabetes:  Always have a fast-acting carb (15 grams) with you to treat low blood sugar. Follow your diabetes care plan as told by your doctor. Make sure you: Know the symptoms of low blood sugar. Check your blood sugar as often as told. Always check it before and after exercise. Always check your blood sugar before you drive. Take your medicines as told. Follow your meal plan. Eat on time. Do not skip meals. Share your diabetes care plan with: Your work or school. People you live with. Carry a card or wear jewelry that says you have diabetes. Where to find more information American Diabetes Association: www.diabetes.org Contact a doctor if: You have trouble keeping your blood sugar in your target range. You have low blood sugar often. Get help right away if: You still have symptoms after you eat or drink something that contains 15 grams of fast-acting carb, and you cannot get your blood sugar above 70 mg/dL by following the 15:15 rule. Your blood sugar is below 54 mg/dL (3 mmol/L). You have a seizure. You faint. These symptoms may be an emergency. Get help right away. Call your local emergency services (911 in the [place] Do not wait to see if the symptoms will go away. Do not drive yourself to the hospital. Summary Hypoglycemia happens when the level of sugar (glucose) in your blood is too low. Low blood sugar can happen to people who have diabetes and people who do not have diabetes. Low blood sugar can happen quickly, and it can be an emergency. Make sure you know the symptoms of low blood sugar and know how  to treat it. Always keep a source of sugar (fast-acting carb) with you to treat low blood sugar. This information is not intended to replace advice given to you by your health care provider. Make sure you discuss any questions you have with your health care provider. Document Revised: 12/13/2019 Document Reviewed: 12/13/2019 Elsevier Patient Education  2023 Elsevier Inc.  

## 2021-12-14 NOTE — Progress Notes (Signed)
Subjective:     Patient ID: [name], female    DOB: 11-19-1970, 51 y.o.   MRN: [id number]  Chief Complaint  Patient presents with   Hypoglycemia    For the past month has been having lower blood sugars, 3-4 times blood sugars dropped without warning. 2 weeks ago had incident where eyes rolled in back of head and she passed out     HPI Patient is in today for hypoglycemia.  States she has had this issue for years.   States in the past she has had tongue tingling and vision changes with low blood sugars.   Blood sugar has been 49 on 2 occasions recently including once while she was driving and again when she had a syncopal episode. States her sister checked her BS and then gave her sugar and peanut butter crackers and she felt much better.   Hx of gastric bypass and typically only eats a few bites of food. Examples include a boiled egg or plain baked chicken.  States she does not eat many carbohydrates.   Reports hx of iron def and has had iron infusions in the past. Denies bleeding.   Denies fever, chills, dizziness, chest pain, palpitations, shortness of breath, abdominal pain, N/V/D, urinary symptoms, LE edema.     Health Maintenance Due  Topic Date Due   Hepatitis C Screening  Never done    Past Medical History:  Diagnosis Date   Anemia    Asthma    Bulging disc    Hypertension    Obesity    PONV (postoperative nausea and vomiting)    PT IS [facility]    Past Surgical History:  Procedure Laterality Date   CHOLECYSTECTOMY     DILITATION & CURRETTAGE/HYSTROSCOPY WITH NOVASURE ABLATION N/A 09/20/2013   Procedure: DILATATION & CURETTAGE/HYSTEROSCOPY WITH NOVASURE ABLATION;  Surgeon: Juluis Mire, MD;  Location: [facility];  Service: Gynecology;  Laterality: N/A;   GASTRIC BYPASS     LAPAROSCOPIC TUBAL LIGATION Bilateral 09/20/2013   Procedure: LAPAROSCOPIC TUBAL LIGATION;  Surgeon: Juluis Mire, MD;  Location: [facility];  Service: Gynecology;  Laterality:  Bilateral;    Family History  Problem Relation Age of Onset   Breast cancer Mother    Hypertension Sister    Stroke Sister    Asthma Brother    Colon cancer Brother    Allergic rhinitis Neg Hx    Eczema Neg Hx    Immunodeficiency Neg Hx    Urticaria Neg Hx    Angioedema Neg Hx     Social History   Socioeconomic History   Marital status: Divorced    Spouse name: Not on file   Number of children: Not on file   Years of education: Not on file   Highest education level: Not on file  Occupational History   Not on file  Tobacco Use   Smoking status: Never   Smokeless tobacco: Never  Substance and Sexual Activity   Alcohol use: No   Drug use: No   Sexual activity: Not on file  Other Topics Concern   Not on file  Social History Narrative   Not on file   Social Determinants of Health   Financial Resource Strain: Not on file  Food Insecurity: Not on file  Transportation Needs: Not on file  Physical Activity: Not on file  Stress: Not on file  Social Connections: Not on file  Intimate Partner Violence: Not on file    Outpatient Medications  Prior to Visit  Medication Sig Dispense Refill   albuterol (PROAIR HFA) 108 (90 Base) MCG/ACT inhaler Inhale 2 puffs into the lungs every 6 (six) hours as needed for wheezing or shortness of breath. 18 g 1   ALPRAZolam (XANAX) 0.25 MG tablet TAKE 1 TABLET BY MOUTH ONCE DAILY AS NEEDED FOR ANXIETY 10 tablet 0   Azelastine-Fluticasone 137-50 MCG/ACT SUSP Place 1 spray into the nose 2 (two) times daily as needed. 23 g 5   cetirizine (ZYRTEC) 10 MG tablet Take 10 mg by mouth daily.     clotrimazole-betamethasone (LOTRISONE) cream Apply 1 application topically 2 (two) times daily. 100 g 2   fluticasone (FLOVENT HFA) 110 MCG/ACT inhaler Inhale 2 puffs into the lungs 2 (two) times daily. 1 Inhaler 5   Fluticasone Propionate (XHANCE) 93 MCG/ACT EXHU 2 sprays per nostril twice daily as needed for stuffy nose. 32 mL 5   furosemide (LASIX) 20 MG  tablet Take 1 tablet (20 mg total) by mouth daily. 30 tablet 3   hydrocortisone (ANUSOL-HC) 2.5 % rectal cream Place 1 application rectally 2 (two) times daily. 30 g 0   hydrocortisone (ANUSOL-HC) 25 MG suppository Place 1 suppository (25 mg total) rectally 2 (two) times daily. 60 suppository 11   hyoscyamine (LEVSIN SL) 0.125 MG SL tablet DISSOLVE 1 TABLET UNDER THE TONGUE EVERY 4 HOURS AS NEEDED 30 tablet 0   metoprolol tartrate (LOPRESSOR) 50 MG tablet TAKE 1/2 (ONE-HALF) TABLET BY MOUTH TWICE DAILY **  ANNUAL  APPOINTMENT  DUE  IN  OCTOBER  MUST  SEE  PROVIDER  FOR  FUTURE  REFILLS** 90 tablet 0   montelukast (SINGULAIR) 10 MG tablet TAKE 1 TABLET BY MOUTH AT BEDTIME 90 tablet 0   mupirocin cream (BACTROBAN) 2 % Apply 1 application topically 2 (two) times daily. 90 g 0   mupirocin ointment (BACTROBAN) 2 % Apply 1 application topically daily. 90 g 0   omeprazole (PRILOSEC) 40 MG capsule Take 1 capsule by mouth once daily 90 capsule 0   sodium chloride (OCEAN) 0.65 % SOLN nasal spray Place 1 spray into both nostrils as needed for congestion. 480 mL 0   SUMAtriptan (IMITREX) 50 MG tablet Take 1 tablet (50 mg total) by mouth every 2 (two) hours as needed for migraine. May repeat in 2 hours if headache persists or recurs. 10 tablet 0   Syringe/Needle, Disp, (SYRINGE 3CC/25GX1-1/2") 25G X 1-1/2" 3 ML MISC Used to inject Vit B12 15 each 1   traMADol (ULTRAM) 50 MG tablet Take 1 tablet (50 mg total) by mouth at bedtime as needed. 30 tablet 0   Semaglutide-Weight Management 0.25 MG/0.5ML SOAJ Inject 0.25 mg into the skin once a week for 28 days. 2 mL 0   [START ON 12/19/2021] Semaglutide-Weight Management 0.5 MG/0.5ML SOAJ Inject 0.5 mg into the skin once a week for 28 days. 2 mL 0   [START ON 01/17/2022] Semaglutide-Weight Management 1 MG/0.5ML SOAJ Inject 1 mg into the skin once a week for 28 days. 2 mL 0   [START ON 02/15/2022] Semaglutide-Weight Management 1.7 MG/0.75ML SOAJ Inject 1.7 mg into the skin  once a week for 28 days. 3 mL 0   [START ON 03/16/2022] Semaglutide-Weight Management 2.4 MG/0.75ML SOAJ Inject 2.4 mg into the skin once a week for 28 days. 3 mL 0   No facility-administered medications prior to visit.    No Known Allergies  ROS     Objective:    Physical Exam Constitutional:  General: She is not in acute distress.    Appearance: She is not ill-appearing.  HENT:     Mouth/Throat:     Mouth: Mucous membranes are moist.  Eyes:     Extraocular Movements: Extraocular movements intact.     Conjunctiva/sclera: Conjunctivae normal.  Cardiovascular:     Rate and Rhythm: Normal rate and regular rhythm.  Pulmonary:     Effort: Pulmonary effort is normal.     Breath sounds: Normal breath sounds.  Skin:    General: Skin is warm and dry.  Neurological:     General: No focal deficit present.     Mental Status: She is alert and oriented to person, place, and time.     Motor: No weakness.     Gait: Gait normal.  Psychiatric:        Mood and Affect: Mood normal.        Behavior: Behavior normal.        Thought Content: Thought content normal.     BP (!) 142/86 (BP Location: Left Arm, Patient Position: Sitting, Cuff Size: Large)   Pulse 64   Temp 97.6 F (36.4 C) (Temporal)   Ht 5\' 5"  (1.651 m)   Wt 300 lb (136.1 kg)   SpO2 100%   BMI 49.92 kg/m  Wt Readings from Last 3 Encounters:  12/14/21 300 lb (136.1 kg)  11/20/21 290 lb 8 oz (131.8 kg)  08/10/21 287 lb 6.4 oz (130.4 kg)       Assessment & Plan:   Problem List Items Addressed This Visit       Other   Hx of gastric bypass   Morbid obesity (HCC)   Other Visit Diagnoses     Hypoglycemia    -  Primary   Iron deficiency anemia, unspecified iron deficiency anemia type          Her main concern today is hypoglycemia. She reports having a long hx of this but more frequent episodes recently. She is here after our lab closed so I am unable to get blood work.  In depth counseling on diet and  nutrition to prevent low blood sugar. Discussed adding carbohydrates to current protein regimen. Keep a close eye on BS at home. She is not on Ozempic, states she never started the medication.   Discussed close follow up with PCP. Due to long holiday weekend, she will return tomorrow afternoon for follow up.    I have discontinued Jaye I. Agne's Semaglutide-Weight Management, Semaglutide-Weight Management, Semaglutide-Weight Management, Semaglutide-Weight Management, and Semaglutide-Weight Management. I am also having her maintain her cetirizine, sodium chloride, furosemide, SUMAtriptan, SYRINGE 3CC/25GX1-1/2", Flovent HFA, hyoscyamine, albuterol, Azelastine-Fluticasone, clotrimazole-betamethasone, Xhance, mupirocin cream, mupirocin ointment, hydrocortisone, hydrocortisone, ALPRAZolam, traMADol, montelukast, omeprazole, and metoprolol tartrate.  No orders of the defined types were placed in this encounter.

## 2021-12-15 ENCOUNTER — Encounter: Payer: Self-pay | Admitting: Internal Medicine

## 2021-12-15 ENCOUNTER — Ambulatory Visit (INDEPENDENT_AMBULATORY_CARE_PROVIDER_SITE_OTHER): Payer: BC Managed Care – PPO | Admitting: Internal Medicine

## 2021-12-15 VITALS — BP 128/80 | HR 61 | Temp 98.0°F | Ht 65.0 in | Wt 298.0 lb

## 2021-12-15 DIAGNOSIS — E162 Hypoglycemia, unspecified: Secondary | ICD-10-CM | POA: Diagnosis not present

## 2021-12-15 MED ORDER — DEXCOM G7 RECEIVER DEVI: 3 refills | Status: AC

## 2021-12-15 MED ORDER — DEXCOM G7 SENSOR MISC: 0 refills | Status: AC

## 2021-12-15 NOTE — Progress Notes (Unsigned)
   Subjective:   Patient ID: [name], female    DOB: [birth year], 51 y.o.   MRN: [id number]  HPI The patient is a 51 YO female coming in for follow up.  Review of Systems  Objective:  Physical Exam  Vitals:   12/15/21 1601 12/15/21 1606  BP: 128/80   Pulse: (!) 31 61  Temp: 98 F (36.7 C)   TempSrc: Oral   SpO2: (!) 73% 99%  Weight: 298 lb (135.2 kg)   Height: 5\' 5"  (1.651 m)     Assessment & Plan:

## 2021-12-15 NOTE — Assessment & Plan Note (Signed)
Concerning with several sugars recently of 40s. Prescribed dexcom 7 to monitor so we can track the pattern. Suspect related to prior gastric bypass.

## 2021-12-15 NOTE — Patient Instructions (Signed)
We will try to get the sugar monitor to use.  If they will not approve let us know.

## 2021-12-31 ENCOUNTER — Encounter: Payer: Self-pay | Admitting: Internal Medicine

## 2022-01-05 ENCOUNTER — Ambulatory Visit: Payer: BC Managed Care – PPO | Admitting: Internal Medicine

## 2022-01-05 ENCOUNTER — Encounter: Payer: Self-pay | Admitting: Internal Medicine

## 2022-01-05 VITALS — BP 120/90 | HR 56 | Temp 98.3°F | Ht 65.0 in | Wt 297.0 lb

## 2022-01-05 DIAGNOSIS — E162 Hypoglycemia, unspecified: Secondary | ICD-10-CM

## 2022-01-05 DIAGNOSIS — M25511 Pain in right shoulder: Secondary | ICD-10-CM

## 2022-01-05 DIAGNOSIS — I10 Essential (primary) hypertension: Secondary | ICD-10-CM

## 2022-01-05 LAB — HEMOGLOBIN A1C: Hgb A1c MFr Bld: 5.5 % (ref 4.6–6.5)

## 2022-01-05 LAB — COMPREHENSIVE METABOLIC PANEL
ALT: 14 U/L (ref 0–35)
AST: 22 U/L (ref 0–37)
Albumin: 3.8 g/dL (ref 3.5–5.2)
Alkaline Phosphatase: 62 U/L (ref 39–117)
BUN: 14 mg/dL (ref 6–23)
CO2: 30 mEq/L (ref 19–32)
Calcium: 9.1 mg/dL (ref 8.4–10.5)
Chloride: 105 mEq/L (ref 96–112)
Creatinine, Ser: 0.5 mg/dL (ref 0.40–1.20)
GFR: 109.4 mL/min (ref >60.00)
Glucose, Bld: 82 mg/dL (ref 70–99)
Potassium: 4.3 mEq/L (ref 3.5–5.1)
Sodium: 140 mEq/L (ref 135–145)
Total Bilirubin: 0.5 mg/dL (ref 0.2–1.2)
Total Protein: 6.3 g/dL (ref 6.0–8.3)

## 2022-01-05 LAB — CBC
HCT: 38.9 % (ref 36.0–46.0)
Hemoglobin: 12.7 g/dL (ref 12.0–15.0)
MCHC: 32.5 g/dL (ref 30.0–36.0)
MCV: 89.6 fl (ref 78.0–100.0)
Platelets: 211 10*3/uL (ref 150.0–400.0)
RBC: 4.35 Mil/uL (ref 3.87–5.11)
RDW: 13.6 % (ref 11.5–15.5)
WBC: 6 10*3/uL (ref 4.0–10.5)

## 2022-01-05 LAB — TSH: TSH: 2.34 u[IU]/mL (ref 0.35–5.50)

## 2022-01-05 MED ORDER — GLUCOSE 40 % PO GEL: 1.0000 | ORAL | 11 refills | Status: AC | PRN

## 2022-01-05 MED ORDER — AMLODIPINE BESYLATE 5 MG PO TABS: 5.0000 mg | ORAL_TABLET | Freq: Every day | ORAL | 0 refills | Status: DC

## 2022-01-05 MED ORDER — GLUCOSE 4 G PO CHEW: 1.0000 | CHEWABLE_TABLET | ORAL | 12 refills | Status: AC | PRN

## 2022-01-05 MED ORDER — PREDNISONE 20 MG PO TABS: 40.0000 mg | ORAL_TABLET | Freq: Every day | ORAL | 0 refills | Status: DC

## 2022-01-05 MED ORDER — CYCLOBENZAPRINE HCL 5 MG PO TABS: 5.0000 mg | ORAL_TABLET | Freq: Three times a day (TID) | ORAL | 1 refills | Status: AC | PRN

## 2022-01-05 NOTE — Patient Instructions (Addendum)
We will check the labs today to help figure out why you are having the low blood sugars.  We will stop the metoprolol and start amlodipine instead which should help the heart rate.

## 2022-01-05 NOTE — Progress Notes (Signed)
   Subjective:   Patient ID: [name], female    DOB: 10/02/1970, 51 y.o.   MRN: [id number]  HPI The patient is a 51 YO female coming in for ongoing and worsening hypoglycemia. Sugar down to 40s not consistent with time from meal. Sometimes within 30 minutes of meal, sometimes when she has not eaten in 4 hours. No precursor symptoms. With other concerns as well.  Review of Systems  Constitutional: Negative.   HENT: Negative.    Eyes: Negative.   Respiratory:  Negative for cough, chest tightness and shortness of breath.   Cardiovascular:  Negative for chest pain, palpitations and leg swelling.  Gastrointestinal:  Negative for abdominal distention, abdominal pain, constipation, diarrhea, nausea and vomiting.  Endocrine:       Hypoglycemia  Musculoskeletal: Negative.   Skin: Negative.   Neurological: Negative.   Psychiatric/Behavioral: Negative.      Objective:  Physical Exam Constitutional:      Appearance: She is well-developed. She is obese.  HENT:     Head: Normocephalic and atraumatic.  Cardiovascular:     Rate and Rhythm: Normal rate and regular rhythm.  Pulmonary:     Effort: Pulmonary effort is normal. No respiratory distress.     Breath sounds: Normal breath sounds. No wheezing or rales.  Abdominal:     General: Bowel sounds are normal. There is no distension.     Palpations: Abdomen is soft.     Tenderness: There is no abdominal tenderness. There is no rebound.  Musculoskeletal:     Cervical back: Normal range of motion.  Skin:    General: Skin is warm and dry.  Neurological:     Mental Status: She is alert and oriented to person, place, and time.     Coordination: Coordination normal.     Vitals:   01/05/22 1438 01/05/22 1448  BP: (!) 122/90 (!) 120/90  Pulse: (!) 56   Temp: 98.3 F (36.8 C)   TempSrc: Oral   SpO2: 99%   Weight: 297 lb (134.7 kg)   Height: 5\' 5"  (1.651 m)     Assessment & Plan:  Visit time 25 minutes in face to face  communication with patient and coordination of care, additional 15 minutes spent in record review, coordination or care, ordering tests, communicating/referring to other healthcare professionals, documenting in medical records all on the same day of the visit for total time 40 minutes spent on the visit.

## 2022-01-06 ENCOUNTER — Encounter: Payer: Self-pay | Admitting: Internal Medicine

## 2022-01-06 LAB — C-PEPTIDE: C-Peptide: 0.86 ng/mL (ref 0.80–3.85)

## 2022-01-06 LAB — INSULIN, RANDOM: Insulin: 1.9 u[IU]/mL

## 2022-01-07 ENCOUNTER — Encounter: Payer: Self-pay | Admitting: Internal Medicine

## 2022-01-08 ENCOUNTER — Encounter: Payer: Self-pay | Admitting: Internal Medicine

## 2022-01-08 DIAGNOSIS — M25519 Pain in unspecified shoulder: Secondary | ICD-10-CM | POA: Insufficient documentation

## 2022-01-08 MED ORDER — FLUCONAZOLE 150 MG PO TABS: 150.0000 mg | ORAL_TABLET | Freq: Once | ORAL | 0 refills | Status: AC

## 2022-01-08 MED ORDER — PROMETHAZINE-DM 6.25-15 MG/5ML PO SYRP: 5.0000 mL | ORAL_SOLUTION | Freq: Four times a day (QID) | ORAL | 0 refills | Status: AC | PRN

## 2022-01-08 MED ORDER — DOXYCYCLINE HYCLATE 100 MG PO TABS: 100.0000 mg | ORAL_TABLET | Freq: Two times a day (BID) | ORAL | 0 refills | Status: DC

## 2022-01-08 NOTE — Assessment & Plan Note (Signed)
We will stop metoprolol due to risk of masking hypoglycemia symptoms. Start amlodipine 5 mg daily instead. She understands to stop.

## 2022-01-08 NOTE — Addendum Note (Signed)
Addended by: Hillard Danker A on: 01/08/2022 09:50 AM   Modules accepted: Orders

## 2022-01-08 NOTE — Assessment & Plan Note (Signed)
This is a life threatening condition. She does not have precursor symptoms. More episodes (2-3 per week) with sugar down into 40s. She is prescribed glucose gel and tablets today. We have tried to get her continuous glucose monitoring to help with warning of low sugar impending but insurance has not approved. Checking insulin random, c-peptide, TSH, CBC, CMP, Hga1c. Referral to endo. It is possible this is related to her prior gastric bypass. Encouraged to eat every 2-3 hours with mix of protein and carbohydrates.

## 2022-01-08 NOTE — Assessment & Plan Note (Signed)
Likely pulled muscle trying to catch herself against a wall with low sugar episode. Rx prednisone and cyclobenzaprine to help with pain.

## 2022-01-09 ENCOUNTER — Other Ambulatory Visit: Payer: Self-pay | Admitting: Internal Medicine

## 2022-01-14 ENCOUNTER — Telehealth: Payer: Self-pay

## 2022-01-14 NOTE — Telephone Encounter (Signed)
Patient has been denied for [facility] by her insurance company [address] and [employer]

## 2022-02-08 ENCOUNTER — Ambulatory Visit: Payer: BC Managed Care – PPO | Admitting: Family Medicine

## 2022-02-08 ENCOUNTER — Ambulatory Visit (INDEPENDENT_AMBULATORY_CARE_PROVIDER_SITE_OTHER): Payer: BC Managed Care – PPO

## 2022-02-08 ENCOUNTER — Ambulatory Visit: Payer: Self-pay

## 2022-02-08 VITALS — BP 140/82 | HR 56 | Ht 65.0 in | Wt 294.0 lb

## 2022-02-08 DIAGNOSIS — M5416 Radiculopathy, lumbar region: Secondary | ICD-10-CM | POA: Diagnosis not present

## 2022-02-08 DIAGNOSIS — G8929 Other chronic pain: Secondary | ICD-10-CM | POA: Diagnosis not present

## 2022-02-08 DIAGNOSIS — M25561 Pain in right knee: Secondary | ICD-10-CM

## 2022-02-08 NOTE — Patient Instructions (Addendum)
Thank you for coming in today.   Please get an Xray today before you leave   You received an injection today. Seek immediate medical attention if the joint becomes red, extremely painful, or is oozing fluid.   Let me know if this is not working.

## 2022-02-08 NOTE — Progress Notes (Signed)
I, Peterson Lombard, LAT, ATC acting as a scribe for Lynne Leader, MD.  Subjective:    CC: Right knee pain  HPI: Patient is a 52 year old female presenting with right knee pain.  Patient was previously seen for this issue by Georgina Snell on 05/04/2019 and was given a right knee steroid injection.  Today, patient reports R knee pain has worsening over the last 1-2 months, and significantly over the last week. Pt locates pain to the anterior and posterior aspect of the R knee and into the R lower leg and dorsum of her R foot. Pt feels "tightness" w/ knee flexion.   She does have some pain in the lateral hip and pain radiating below the level of the knee.  R Knee swelling: no Mechanical symptoms: yes Radiates: yes Aggravates: prolong sitting or standing, driving, walking, knee flexion Treatments tried: IBU, Tylenol, Biofreeze, Aspercream, tramadol  Pertinent review of Systems: No fevers or chills  Relevant historical information: Hypertension.  Obesity.   Objective:    Vitals:   02/08/22 1311  BP: (!) 140/82  Pulse: (!) 56  SpO2: 99%   General: Well Developed, well nourished, and in no acute distress.   MSK: Right knee obese knee effusion difficult to tell.  Decreased range of motion.  Tender palpation medial and lateral joint line. Stable ligamentous exam.  Intact strength.  Pain with resisted knee flexion and extension.  L-spine decreased lumbar range of motion.  Lower extremity strength is intact.  Lab and Radiology Results  Procedure: Real-time Ultrasound Guided Injection of right knee superior lateral patellar space Device: Philips Affiniti 50G Images permanently stored and available for review in PACS Ultrasound evaluation prior to injection reveals a moderate to large knee effusion in the superior patellar space. Verbal informed consent obtained.  Discussed risks and benefits of procedure. Warned about infection, bleeding, hyperglycemia damage to structures among  others. Patient expresses understanding and agreement Time-out conducted.   Noted no overlying erythema, induration, or other signs of local infection.   Skin prepped in a sterile fashion.   Local anesthesia: Topical Ethyl chloride.   With sterile technique and under real time ultrasound guidance: 40 mg of Kenalog and 2 mL of Marcaine injected into knee joint. Fluid seen entering the joint capsule.   Completed without difficulty   Pain immediately resolved suggesting accurate placement of the medication.   Advised to call if fevers/chills, erythema, induration, drainage, or persistent bleeding.   Images permanently stored and available for review in the ultrasound unit.  Impression: Technically successful ultrasound guided injection. Spinal needle used   No results found for this or any previous visit (from the past 72 hour(s)). DG Lumbar Spine 2-3 Views  Result Date: 02/08/2022 CLINICAL DATA:  Lumbar radiculopathy.  Chronic low back pain. EXAM: LUMBAR SPINE - 2-3 VIEW COMPARISON:  None Available. FINDINGS: There are 5 non rib-bearing lumbar type vertebral bodies. Normal alignment of the lumbar spine. No anterolisthesis or retrolisthesis. No significant scoliotic curvature Lumbar vertebral body heights appear preserved. Mild multilevel lumbar spine DDD, worse at L1-L2 and L2-L3 with disc space height loss endplate irregularity and sclerosis. Limited visualization of the bilateral SI joints is normal. Surgical clips overlie the right mid abdomen and midline of the pelvis. Additional surgical clips overlie the left upper abdominal quadrant. Nonobstructive bowel gas pattern. IMPRESSION: Mild multilevel lumbar spine DDD, worse at L1-L2 and L2-L3. Electronically Signed   By: Sandi Mariscal M.D.   On: 02/08/2022 14:08   DG Knee AP/LAT W/Sunrise Right  Result Date: 02/08/2022 CLINICAL DATA:  Chronic right knee pain. EXAM: RIGHT KNEE 3 VIEWS COMPARISON:  None Available. FINDINGS: Examination is degraded  due to patient body habitus. No fracture or dislocation. Mild degenerative change of the medial compartment of the knee with joint space loss, subchondral sclerosis and osteophytosis. No joint effusion. No evidence of chondrocalcinosis. No radiopaque foreign body. IMPRESSION: Mild degenerative change of the medial compartment of the right knee. Electronically Signed   By: Sandi Mariscal M.D.   On: 02/08/2022 14:06    I, Lynne Leader, personally (independently) visualized and performed the interpretation of the images attached in this note.   Impression and Recommendations:    Assessment and Plan: 52 y.o. female with right knee and leg pain.  Etiology is unclear.. Patient has pain focused mainly around the knee but extending into the hip and radiating down below the knee into the lower leg.  I think the most likely explanation is mostly knee pain due to DJD exacerbation.  She may have some lumbar radiculopathy or trochanteric bursitis plus to explain her pain.  On ultrasound she does have a relatively large joint effusion which does fit with exacerbation of DJD.  Plan for steroid injection today in the knee and if not improved to return for recheck.  X-ray lumbar spine and knee today.  PDMP not reviewed this encounter. Orders Placed This Encounter  Procedures   [place] LIMITED JOINT SPACE STRUCTURES LOW RIGHT(NO LINKED CHARGES)    Order Specific Question:   Reason for Exam (SYMPTOM  OR DIAGNOSIS REQUIRED)    Answer:   Right knee pain    Order Specific Question:   Preferred imaging location?    Answer:   [place]   DG Knee AP/LAT W/Sunrise Right    Standing Status:   Future    Number of Occurrences:   1    Standing Expiration Date:   03/11/2022    Order Specific Question:   Reason for Exam (SYMPTOM  OR DIAGNOSIS REQUIRED)    Answer:   right knee pain    Order Specific Question:   Preferred imaging location?    Answer:   [facility]    Order Specific Question:   Is  patient pregnant?    Answer:   No   DG Lumbar Spine 2-3 Views    Standing Status:   Future    Number of Occurrences:   1    Standing Expiration Date:   03/11/2022    Order Specific Question:   Reason for Exam (SYMPTOM  OR DIAGNOSIS REQUIRED)    Answer:   lumbar radiculopathy    Order Specific Question:   Preferred imaging location?    Answer:   [facility]    Order Specific Question:   Is patient pregnant?    Answer:   No   No orders of the defined types were placed in this encounter.   Discussed warning signs or symptoms. Please see discharge instructions. Patient expresses understanding.   The above documentation has been reviewed and is accurate and complete Lynne Leader, M.D.

## 2022-02-09 ENCOUNTER — Encounter: Payer: Self-pay | Admitting: Family Medicine

## 2022-02-09 NOTE — Progress Notes (Signed)
Right knee x-ray shows mild arthritis.

## 2022-02-09 NOTE — Progress Notes (Signed)
Lumbar spine x-ray shows mild arthritis changes.

## 2022-03-12 ENCOUNTER — Encounter: Payer: Self-pay | Admitting: Family Medicine

## 2022-03-30 ENCOUNTER — Other Ambulatory Visit: Payer: Self-pay | Admitting: Internal Medicine

## 2022-05-21 ENCOUNTER — Ambulatory Visit: Payer: BC Managed Care – PPO | Admitting: Internal Medicine

## 2022-05-21 ENCOUNTER — Ambulatory Visit: Payer: BC Managed Care – PPO

## 2022-09-16 ENCOUNTER — Other Ambulatory Visit (HOSPITAL_BASED_OUTPATIENT_CLINIC_OR_DEPARTMENT_OTHER): Payer: Self-pay

## 2022-09-16 ENCOUNTER — Ambulatory Visit (HOSPITAL_BASED_OUTPATIENT_CLINIC_OR_DEPARTMENT_OTHER): Payer: BC Managed Care – PPO

## 2022-09-16 ENCOUNTER — Ambulatory Visit (INDEPENDENT_AMBULATORY_CARE_PROVIDER_SITE_OTHER): Payer: BC Managed Care – PPO | Admitting: Student

## 2022-09-16 ENCOUNTER — Encounter (HOSPITAL_BASED_OUTPATIENT_CLINIC_OR_DEPARTMENT_OTHER): Payer: Self-pay | Admitting: Student

## 2022-09-16 DIAGNOSIS — M5441 Lumbago with sciatica, right side: Secondary | ICD-10-CM | POA: Diagnosis not present

## 2022-09-16 DIAGNOSIS — M5412 Radiculopathy, cervical region: Secondary | ICD-10-CM

## 2022-09-16 DIAGNOSIS — M542 Cervicalgia: Secondary | ICD-10-CM

## 2022-09-16 MED ORDER — METHYLPREDNISOLONE 4 MG PO TBPK
ORAL_TABLET | ORAL | 0 refills | Status: AC
  Filled 2022-09-16: qty 21, 6d supply, fill #0

## 2022-09-16 NOTE — Progress Notes (Signed)
Chief Complaint: Neck and low back pain     History of Present Illness:    [name] is a 52 y.o. female presenting today with neck and low back pain.  The symptoms began about 2 and half weeks ago with no known injuries.  She is having radiating numbness and tingling extending down the right arm from the neck all the way into the fingers.  Denies any loss of strength.  Does not have any history of neck issues.  The pain in her low back is more right-sided she does have numbness and tingling radiating down the right leg to the foot.  Reports a history of a bulging lumbar disc many years ago.  Has tried taking Tylenol and ibuprofen with little effect.  Denies saddle anesthesia and bowel/bladder dysfunction.  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   Surgical History:   None  PMH/PSH/Family History/Social History/Meds/Allergies:    Past Medical History:  Diagnosis Date   Anemia    Asthma    Bulging disc    Hypertension    Obesity    PONV (postoperative nausea and vomiting)    PT IS LUMBEE INDIAN   Past Surgical History:  Procedure Laterality Date   CHOLECYSTECTOMY     DILITATION & CURRETTAGE/HYSTROSCOPY WITH NOVASURE ABLATION N/A 09/20/2013   Procedure: DILATATION & CURETTAGE/HYSTEROSCOPY WITH NOVASURE ABLATION;  Surgeon: Juluis Mire, MD;  Location: [facility];  Service: Gynecology;  Laterality: N/A;   GASTRIC BYPASS     LAPAROSCOPIC TUBAL LIGATION Bilateral 09/20/2013   Procedure: LAPAROSCOPIC TUBAL LIGATION;  Surgeon: Juluis Mire, MD;  Location: [facility];  Service: Gynecology;  Laterality: Bilateral;   Social History   Socioeconomic History   Marital status: Divorced    Spouse name: Not on file   Number of children: Not on file   Years of education: Not on file   Highest education level: Not on file  Occupational History   Not on file   Tobacco Use   Smoking status: Never   Smokeless tobacco: Never  Substance and Sexual Activity   Alcohol use: No   Drug use: No   Sexual activity: Not on file  Other Topics Concern   Not on file  Social History Narrative   Not on file   Social Determinants of Health   Financial Resource Strain: Unknown (12/03/2020)   Received from [facility], [facility]   Overall Financial Resource Strain (CARDIA)    Difficulty of Paying Living Expenses: Patient declined  Food Insecurity: No Food Insecurity (04/24/2021)   Received from [facility], [facility]   Hunger Vital Sign    Worried About Running Out of Food in the Last Year: Never true    Ran Out of Food in the Last Year: Never true  Transportation Needs: No Transportation Needs (12/03/2020)   Received from [facility], [facility]   PRAPARE - Transportation    Lack of Transportation (Medical): No    Lack of Transportation (Non-Medical): No  Physical Activity: Sufficiently Active (12/03/2020)   Received from [facility], [facility]   Exercise Vital Sign    Days of Exercise per Week: 5 days    Minutes of Exercise  per Session: 40 min  Stress: Stress Concern Present (12/03/2020)   Received from [facility], [facility] - Occupational Stress Questionnaire    Feeling of Stress : To some extent  Social Connections: Unknown (05/25/2021)   Received from [facility], [facility]   Social Network    Social Network: Not on file   Family History  Problem Relation Age of Onset   Breast cancer Mother    Hypertension Sister    Stroke Sister    Asthma Brother    Colon cancer Brother    Allergic rhinitis Neg Hx    Eczema Neg Hx    Immunodeficiency Neg Hx    Urticaria Neg Hx    Angioedema Neg Hx    No Known Allergies Current Outpatient Medications  Medication Sig Dispense Refill   methylPREDNISolone (MEDROL DOSEPAK) 4 MG TBPK tablet Take per packet instructions 21 each 0    albuterol (PROAIR HFA) 108 (90 Base) MCG/ACT inhaler Inhale 2 puffs into the lungs every 6 (six) hours as needed for wheezing or shortness of breath. 18 g 1   ALPRAZolam (XANAX) 0.25 MG tablet TAKE 1 TABLET BY MOUTH ONCE DAILY AS NEEDED FOR ANXIETY 10 tablet 0   amLODipine (NORVASC) 5 MG tablet Take 1 tablet by mouth once daily 90 tablet 2   Azelastine-Fluticasone 137-50 MCG/ACT SUSP Place 1 spray into the nose 2 (two) times daily as needed. 23 g 5   cetirizine (ZYRTEC) 10 MG tablet Take 10 mg by mouth daily.     clotrimazole-betamethasone (LOTRISONE) cream Apply 1 application topically 2 (two) times daily. 100 g 2   Continuous Blood Gluc Receiver (DEXCOM G7 RECEIVER) DEVI Use to monitor sugars E16.2 Dx 2 each 3   Continuous Blood Gluc Sensor (DEXCOM G7 SENSOR) MISC Use to monitor sugars, dx E16.2 1 each 0   cyclobenzaprine (FLEXERIL) 5 MG tablet Take 1 tablet (5 mg total) by mouth 3 (three) times daily as needed for muscle spasms. 30 tablet 1   dextrose (GLUTOSE) 40 % GEL Take 31 g by mouth every 5 (five) hours as needed for low blood sugar. 37.5 g 11   fluticasone (FLOVENT HFA) 110 MCG/ACT inhaler Inhale 2 puffs into the lungs 2 (two) times daily. 1 Inhaler 5   Fluticasone Propionate (XHANCE) 93 MCG/ACT EXHU 2 sprays per nostril twice daily as needed for stuffy nose. 32 mL 5   furosemide (LASIX) 20 MG tablet Take 1 tablet (20 mg total) by mouth daily. 30 tablet 3   GEMTESA 75 MG TABS Take 1 tablet by mouth daily.     glucose 4 GM chewable tablet Chew 1 tablet (4 g total) by mouth as needed for low blood sugar. 50 tablet 12   hydrocortisone (ANUSOL-HC) 2.5 % rectal cream Place 1 application rectally 2 (two) times daily. 30 g 0   hydrocortisone (ANUSOL-HC) 25 MG suppository Place 1 suppository (25 mg total) rectally 2 (two) times daily. 60 suppository 11   hyoscyamine (LEVSIN SL) 0.125 MG SL tablet DISSOLVE 1 TABLET UNDER THE TONGUE EVERY 4 HOURS AS NEEDED 30 tablet 0   montelukast (SINGULAIR)  10 MG tablet TAKE 1 TABLET BY MOUTH AT BEDTIME 90 tablet 0   mupirocin cream (BACTROBAN) 2 % Apply 1 application topically 2 (two) times daily. 90 g 0   mupirocin ointment (BACTROBAN) 2 % Apply 1 application topically daily. 90 g 0   omeprazole (PRILOSEC) 40 MG capsule Take 1 capsule by mouth once  daily 90 capsule 0   promethazine-dextromethorphan (PROMETHAZINE-DM) 6.25-15 MG/5ML syrup Take 5 mLs by mouth 4 (four) times daily as needed. 118 mL 0   sodium chloride (OCEAN) 0.65 % SOLN nasal spray Place 1 spray into both nostrils as needed for congestion. 480 mL 0   SUMAtriptan (IMITREX) 50 MG tablet Take 1 tablet (50 mg total) by mouth every 2 (two) hours as needed for migraine. May repeat in 2 hours if headache persists or recurs. 10 tablet 0   Syringe/Needle, Disp, (SYRINGE 3CC/25GX1-1/2") 25G X 1-1/2" 3 ML MISC Used to inject Vit B12 15 each 1   traMADol (ULTRAM) 50 MG tablet Take 1 tablet (50 mg total) by mouth at bedtime as needed. 30 tablet 0   No current facility-administered medications for this visit.   No results found.  Review of Systems:   A ROS was performed including pertinent positives and negatives as documented in the HPI.  Physical Exam :   Constitutional: NAD and appears stated age Neurological: Alert and oriented Psych: Appropriate affect and cooperative There were no vitals taken for this visit.   Comprehensive Musculoskeletal Exam:    Tenderness palpation over the cervical paraspinal muscles.  She does have tightness and tenderness throughout the upper trapezius bilaterally with multiple palpable trigger points.  Full range of motion of shoulders bilaterally.  Grip strength 5/5 bilaterally.  Negative Spurling's.  Tenderness over the lumbar spine and right lumbar area.  Negative straight leg raise bilaterally.  Knee flexion and extension strength 5/5.  Imaging:   Xray (cervical spine 4 views): Negative  Xray (lumbar spine 4 views): Mild spondylolisthesis of L4-L5.   Mild degenerative changes throughout.  I personally reviewed and interpreted the radiographs.   Assessment:   52 y.o. female with chronic low back pain with right-sided sciatica and neck pain with right-sided numbness and tingling consistent with cervical radiculopathy.  She does have notable muscular tightness throughout the neck and upper trapezius as well as in the low back which I do believe could be contributing to her pain and radicular symptoms.  Overall her radiographs today are well-appearing.  I do think she would benefit from physical therapy for stretching and strengthening as well as to rest muscular tension possibly regenerating.  I will plan to start her on a Medrol Dosepak to get her some relief in the short-term.  Will plan to follow-up in 4 weeks for reassessment.  Plan :    -Start Medrol Dosepak -Referral to physical therapy -Return to clinic in 4 weeks for reevaluation     I personally saw and evaluated the patient, and participated in the management and treatment plan.  Hazle Nordmann, PA-C Orthopedics  This document was dictated using Conservation officer, historic buildings. A reasonable attempt at proof reading has been made to minimize errors.

## 2022-09-17 ENCOUNTER — Encounter (HOSPITAL_BASED_OUTPATIENT_CLINIC_OR_DEPARTMENT_OTHER): Payer: Self-pay

## 2022-09-20 ENCOUNTER — Other Ambulatory Visit (HOSPITAL_BASED_OUTPATIENT_CLINIC_OR_DEPARTMENT_OTHER): Payer: Self-pay | Admitting: Student

## 2022-09-20 MED ORDER — METHOCARBAMOL 500 MG PO TABS: 500.0000 mg | ORAL_TABLET | Freq: Four times a day (QID) | ORAL | 0 refills | Status: AC | PRN

## 2022-10-04 ENCOUNTER — Encounter: Payer: Self-pay | Admitting: Internal Medicine

## 2022-10-08 ENCOUNTER — Ambulatory Visit: Payer: BC Managed Care – PPO | Admitting: Physical Therapy

## 2022-10-12 ENCOUNTER — Ambulatory Visit (HOSPITAL_BASED_OUTPATIENT_CLINIC_OR_DEPARTMENT_OTHER): Payer: BC Managed Care – PPO | Admitting: Student

## 2023-01-04 ENCOUNTER — Other Ambulatory Visit (HOSPITAL_COMMUNITY): Payer: Self-pay | Admitting: Physician Assistant

## 2023-01-04 DIAGNOSIS — Q2381 Bicuspid aortic valve: Secondary | ICD-10-CM

## 2023-01-25 ENCOUNTER — Other Ambulatory Visit (HOSPITAL_BASED_OUTPATIENT_CLINIC_OR_DEPARTMENT_OTHER): Payer: Self-pay

## 2023-02-18 ENCOUNTER — Ambulatory Visit (HOSPITAL_COMMUNITY): Payer: 59 | Attending: Physician Assistant

## 2023-02-18 DIAGNOSIS — Q2381 Bicuspid aortic valve: Secondary | ICD-10-CM | POA: Insufficient documentation

## 2023-02-18 DIAGNOSIS — Q231 Congenital insufficiency of aortic valve: Secondary | ICD-10-CM

## 2023-02-18 LAB — ECHOCARDIOGRAM COMPLETE
Area-P 1/2: 3.91 cm2
S' Lateral: 3.6 cm

## 2023-08-29 ENCOUNTER — Other Ambulatory Visit: Payer: Self-pay | Admitting: Obstetrics and Gynecology

## 2023-08-29 DIAGNOSIS — Z136 Encounter for screening for cardiovascular disorders: Secondary | ICD-10-CM

## 2023-09-30 ENCOUNTER — Other Ambulatory Visit

## 2023-11-07 ENCOUNTER — Other Ambulatory Visit: Payer: Self-pay

## 2023-11-07 ENCOUNTER — Ambulatory Visit: Admitting: Family Medicine

## 2023-11-07 VITALS — BP 126/80 | HR 71 | Ht 65.0 in | Wt 286.0 lb

## 2023-11-07 DIAGNOSIS — M25562 Pain in left knee: Secondary | ICD-10-CM | POA: Diagnosis not present

## 2023-11-07 DIAGNOSIS — G8929 Other chronic pain: Secondary | ICD-10-CM | POA: Diagnosis not present

## 2023-11-07 NOTE — Progress Notes (Unsigned)
   I, [name] am a scribe for Dr. Artist Lloyd, MD.  [name] is a 53 y.o. female who presents to [facility] today for left knee pain. Pt was last seen by Dr. Lloyd on 02/08/22 for right knee pain and lumbar radiculopathy. XR was performed of the knee and low back. Pt received steroid knee injection and was advised to follow up if no improvement.  Today, patient reports that it started about 1st of September. Left knee hurts worse the longer she sits. Pain is waking her up at night and radiates down her leg. Rarely the back of the knee hurts too. Tylenol  arthritis, Ibuprofen, icy hot, tiger palm. No injury. Patient was helping brother at the [facility] and the pain flared up. Location of [place] was on uneven ground with a lot of walking around.    Diagnostic Imaging:  02/08/22 - XR R Knee and L-spine  09/16/22 - XR L Spine and C-spine  05/23/14 - MRI L Knee   Pertinent review of systems: No fevers or chills  Relevant historical information: Hypertension and obesity.   Exam:  BP 126/80   Pulse 71   Ht 5' 5 (1.651 m)   Wt 286 lb (129.7 kg)   SpO2 98%   BMI 47.59 kg/m  General: Well Developed, well nourished, and in no acute distress.   MSK: Left knee normal-appearing Normal motion. Intact strength. Stable ligamentous exam.    Lab and Radiology Results  Procedure: Real-time Ultrasound Guided Injection of left knee joint superior lateral patella space Device: Philips Affiniti 50G/GE Logiq Images permanently stored and available for review in PACS Verbal informed consent obtained.  Discussed risks and benefits of procedure. Warned about infection, bleeding, hyperglycemia damage to structures among others. Patient expresses understanding and agreement Time-out conducted.   Noted no overlying erythema, induration, or other signs of local infection.   Skin prepped in a sterile fashion.   Local anesthesia: Topical Ethyl chloride.    With sterile technique and under real time ultrasound guidance: 40 mg of Kenalog  and 2 mL of Marcaine  injected into knee joint. Fluid seen entering the joint capsule.   Completed without difficulty   Pain immediately resolved suggesting accurate placement of the medication.   Advised to call if fevers/chills, erythema, induration, drainage, or persistent bleeding.   Images permanently stored and available for review in the ultrasound unit.  Impression: Technically successful ultrasound guided injection.       Assessment and Plan: 53 y.o. female with left knee pain due to DJD exacerbation.  Plan for steroid injections today.  Continue quad strengthening exercises.  Recheck as needed.   PDMP not reviewed this encounter. Orders Placed This Encounter  Procedures   [place]  LIMITED JOINT SPACE STRUCTURES LOW LEFT(NO LINKED CHARGES)    Reason for Exam (SYMPTOM  OR DIAGNOSIS REQUIRED):   left knee pain    Preferred imaging location?:   [facility]   No orders of the defined types were placed in this encounter.    Discussed warning signs or symptoms. Please see discharge instructions. Patient expresses understanding.   The above documentation has been reviewed and is accurate and complete Artist Lloyd, M.D.

## 2023-11-07 NOTE — Patient Instructions (Signed)
 Thank you for coming in today. Injection of steroid in left knee today. Check back as needed.

## 2024-01-02 ENCOUNTER — Other Ambulatory Visit (HOSPITAL_BASED_OUTPATIENT_CLINIC_OR_DEPARTMENT_OTHER): Payer: Self-pay
